# Patient Record
Sex: Female | Born: 1970 | Race: Black or African American | Hispanic: No | Smoking: Current every day smoker
Health system: Southern US, Community
[De-identification: ages and names within clinical notes are randomized; demographics above are authoritative.]

## PROBLEM LIST (undated history)

## (undated) DIAGNOSIS — R7303 Prediabetes: Secondary | ICD-10-CM

## (undated) DIAGNOSIS — R531 Weakness: Secondary | ICD-10-CM

## (undated) DIAGNOSIS — I10 Essential (primary) hypertension: Secondary | ICD-10-CM

## (undated) DIAGNOSIS — R748 Abnormal levels of other serum enzymes: Secondary | ICD-10-CM

## (undated) DIAGNOSIS — H5440 Blindness, one eye, unspecified eye: Secondary | ICD-10-CM

## (undated) DIAGNOSIS — F1010 Alcohol abuse, uncomplicated: Secondary | ICD-10-CM

## (undated) DIAGNOSIS — F419 Anxiety disorder, unspecified: Secondary | ICD-10-CM

## (undated) DIAGNOSIS — D509 Iron deficiency anemia, unspecified: Secondary | ICD-10-CM

## (undated) DIAGNOSIS — Z8619 Personal history of other infectious and parasitic diseases: Secondary | ICD-10-CM

## (undated) DIAGNOSIS — Z970 Presence of artificial eye: Secondary | ICD-10-CM

## (undated) DIAGNOSIS — J45909 Unspecified asthma, uncomplicated: Secondary | ICD-10-CM

## (undated) DIAGNOSIS — G4700 Insomnia, unspecified: Secondary | ICD-10-CM

## (undated) DIAGNOSIS — G40909 Epilepsy, unspecified, not intractable, without status epilepticus: Secondary | ICD-10-CM

## (undated) HISTORY — DX: Insomnia, unspecified: G47.00

## (undated) HISTORY — DX: Blindness, one eye, unspecified eye: H54.40

## (undated) HISTORY — DX: Anxiety disorder, unspecified: F41.9

## (undated) HISTORY — PX: APPENDECTOMY: SHX54

## (undated) HISTORY — DX: Prediabetes: R73.03

## (undated) HISTORY — DX: Epilepsy, unspecified, not intractable, without status epilepticus: G40.909

## (undated) HISTORY — DX: Abnormal levels of other serum enzymes: R74.8

## (undated) HISTORY — DX: Alcohol abuse, uncomplicated: F10.10

## (undated) HISTORY — DX: Iron deficiency anemia, unspecified: D50.9

## (undated) HISTORY — DX: Presence of artificial eye: Z97.0

## (undated) HISTORY — DX: Weakness: R53.1

## (undated) HISTORY — DX: Personal history of other infectious and parasitic diseases: Z86.19

## (undated) HISTORY — PX: ANKLE FRACTURE SURGERY: SHX122

## (undated) HISTORY — DX: Unspecified asthma, uncomplicated: J45.909

## (undated) HISTORY — PX: TONSILLECTOMY: SUR1361

---

## 1997-07-27 DIAGNOSIS — R531 Weakness: Secondary | ICD-10-CM

## 1997-07-27 DIAGNOSIS — H5440 Blindness, one eye, unspecified eye: Secondary | ICD-10-CM

## 1997-07-27 HISTORY — PX: OTHER SURGICAL HISTORY: SHX169

## 1997-07-27 HISTORY — DX: Weakness: R53.1

## 1997-07-27 HISTORY — DX: Blindness, one eye, unspecified eye: H54.40

## 2004-11-27 ENCOUNTER — Emergency Department: Payer: Self-pay | Admitting: Internal Medicine

## 2005-02-03 ENCOUNTER — Emergency Department: Payer: Self-pay | Admitting: Emergency Medicine

## 2005-10-28 ENCOUNTER — Ambulatory Visit: Payer: Self-pay | Admitting: Podiatry

## 2006-02-25 ENCOUNTER — Inpatient Hospital Stay: Payer: Self-pay | Admitting: General Practice

## 2006-06-02 ENCOUNTER — Encounter: Payer: Self-pay | Admitting: Physician Assistant

## 2006-08-19 ENCOUNTER — Emergency Department: Payer: Self-pay | Admitting: General Practice

## 2007-06-01 ENCOUNTER — Other Ambulatory Visit: Payer: Self-pay

## 2007-06-01 ENCOUNTER — Emergency Department: Payer: Self-pay | Admitting: Internal Medicine

## 2007-10-05 ENCOUNTER — Ambulatory Visit: Payer: Self-pay | Admitting: Family Medicine

## 2009-01-26 ENCOUNTER — Emergency Department: Payer: Self-pay | Admitting: Emergency Medicine

## 2009-02-19 ENCOUNTER — Ambulatory Visit: Payer: Self-pay | Admitting: Family Medicine

## 2009-04-08 ENCOUNTER — Emergency Department: Payer: Self-pay | Admitting: Emergency Medicine

## 2009-08-10 ENCOUNTER — Emergency Department: Payer: Self-pay | Admitting: Emergency Medicine

## 2009-10-05 ENCOUNTER — Emergency Department: Payer: Self-pay | Admitting: Unknown Physician Specialty

## 2009-12-11 ENCOUNTER — Ambulatory Visit: Payer: Self-pay | Admitting: Internal Medicine

## 2010-01-31 ENCOUNTER — Emergency Department: Payer: Self-pay

## 2010-10-30 ENCOUNTER — Emergency Department: Payer: Self-pay | Admitting: Emergency Medicine

## 2011-11-08 ENCOUNTER — Emergency Department: Payer: Self-pay | Admitting: Emergency Medicine

## 2011-11-08 LAB — BASIC METABOLIC PANEL
Anion Gap: 13 (ref 7–16)
BUN: 7 mg/dL (ref 7–18)
Calcium, Total: 8.4 mg/dL — ABNORMAL LOW (ref 8.5–10.1)
Chloride: 106 mmol/L (ref 98–107)
Creatinine: 0.71 mg/dL (ref 0.60–1.30)
EGFR (Non-African Amer.): >60
Glucose: 92 mg/dL (ref 65–99)

## 2011-11-08 LAB — TROPONIN I: Troponin-I: <0.02 ng/mL

## 2011-11-08 LAB — LIPASE, BLOOD: Lipase: 112 U/L (ref 73–393)

## 2011-11-08 LAB — HEPATIC FUNCTION PANEL A (ARMC)
Albumin: 4 g/dL (ref 3.4–5.0)
Alkaline Phosphatase: 85 U/L (ref 50–136)
Bilirubin, Direct: <0.1 mg/dL (ref 0.00–0.20)
SGOT(AST): 58 U/L — ABNORMAL HIGH (ref 15–37)
Total Protein: 9 g/dL — ABNORMAL HIGH (ref 6.4–8.2)

## 2011-11-08 LAB — CBC
HGB: 14.1 g/dL (ref 12.0–16.0)
MCH: 24.1 pg — ABNORMAL LOW (ref 26.0–34.0)
MCHC: 31.6 g/dL — ABNORMAL LOW (ref 32.0–36.0)
MCV: 76 fL — ABNORMAL LOW (ref 80–100)
RDW: 15.8 % — ABNORMAL HIGH (ref 11.5–14.5)
WBC: 10.1 10*3/uL (ref 3.6–11.0)

## 2012-04-11 ENCOUNTER — Encounter: Payer: Self-pay | Admitting: Internal Medicine

## 2012-04-26 ENCOUNTER — Encounter: Payer: Self-pay | Admitting: Internal Medicine

## 2012-05-27 ENCOUNTER — Encounter: Payer: Self-pay | Admitting: Internal Medicine

## 2012-05-30 ENCOUNTER — Ambulatory Visit: Payer: Self-pay | Admitting: Gastroenterology

## 2012-06-08 ENCOUNTER — Ambulatory Visit: Payer: Self-pay | Admitting: Gastroenterology

## 2012-06-09 LAB — PATHOLOGY REPORT

## 2012-07-27 HISTORY — PX: CHOLECYSTECTOMY: SHX55

## 2012-08-03 ENCOUNTER — Ambulatory Visit: Payer: Self-pay | Admitting: Emergency Medicine

## 2012-08-09 ENCOUNTER — Ambulatory Visit: Payer: Self-pay | Admitting: Emergency Medicine

## 2012-08-09 LAB — HEPATIC FUNCTION PANEL A (ARMC)
Albumin: 3.9 g/dL (ref 3.4–5.0)
Alkaline Phosphatase: 93 U/L (ref 50–136)
Bilirubin,Total: 0.4 mg/dL (ref 0.2–1.0)

## 2012-08-10 LAB — PATHOLOGY REPORT

## 2013-03-21 ENCOUNTER — Inpatient Hospital Stay: Payer: Self-pay | Admitting: Internal Medicine

## 2013-03-21 LAB — COMPREHENSIVE METABOLIC PANEL
Alkaline Phosphatase: 130 U/L (ref 50–136)
Anion Gap: 17 — ABNORMAL HIGH (ref 7–16)
BUN: 14 mg/dL (ref 7–18)
Bilirubin,Total: 4 mg/dL — ABNORMAL HIGH (ref 0.2–1.0)
Calcium, Total: 9 mg/dL (ref 8.5–10.1)
Chloride: 98 mmol/L (ref 98–107)
Co2: 13 mmol/L — ABNORMAL LOW (ref 21–32)
Creatinine: 1.8 mg/dL — ABNORMAL HIGH (ref 0.60–1.30)
EGFR (African American): 40 — ABNORMAL LOW
EGFR (Non-African Amer.): 35 — ABNORMAL LOW
Glucose: 90 mg/dL (ref 65–99)
Osmolality: 257 (ref 275–301)
Potassium: 6.4 mmol/L — ABNORMAL HIGH (ref 3.5–5.1)
SGOT(AST): >2000 U/L — ABNORMAL HIGH (ref 15–37)
Sodium: 128 mmol/L — ABNORMAL LOW (ref 136–145)

## 2013-03-21 LAB — HEPATIC FUNCTION PANEL A (ARMC)
Alkaline Phosphatase: 101 U/L (ref 50–136)
Bilirubin,Total: 3 mg/dL — ABNORMAL HIGH (ref 0.2–1.0)
SGOT(AST): >2000 U/L — ABNORMAL HIGH (ref 15–37)
SGPT (ALT): >4000 U/L — ABNORMAL HIGH (ref 12–78)
Total Protein: 7.1 g/dL (ref 6.4–8.2)

## 2013-03-21 LAB — ETHANOL: Ethanol %: <0.003 % (ref 0.000–0.080)

## 2013-03-21 LAB — CBC
HGB: 16.2 g/dL — ABNORMAL HIGH (ref 12.0–16.0)
MCH: 24.6 pg — ABNORMAL LOW (ref 26.0–34.0)
MCHC: 32.2 g/dL (ref 32.0–36.0)
MCV: 77 fL — ABNORMAL LOW (ref 80–100)
Platelet: 242 10*3/uL (ref 150–440)
RBC: 6.57 10*6/uL — ABNORMAL HIGH (ref 3.80–5.20)
RDW: 14.7 % — ABNORMAL HIGH (ref 11.5–14.5)
WBC: 11.5 10*3/uL — ABNORMAL HIGH (ref 3.6–11.0)

## 2013-03-21 LAB — PROTIME-INR: Prothrombin Time: 25.6 secs — ABNORMAL HIGH (ref 11.5–14.7)

## 2013-03-21 LAB — AMMONIA: Ammonia, Plasma: 54 mcmol/L — ABNORMAL HIGH (ref 11–32)

## 2013-03-21 LAB — ACETAMINOPHEN LEVEL
Acetaminophen: 60 ug/mL — ABNORMAL HIGH
Acetaminophen: 50 ug/mL — ABNORMAL HIGH

## 2013-03-22 LAB — COMPREHENSIVE METABOLIC PANEL
Albumin: 2.7 g/dL — ABNORMAL LOW (ref 3.4–5.0)
Alkaline Phosphatase: 86 U/L (ref 50–136)
Bilirubin,Total: 2.5 mg/dL — ABNORMAL HIGH (ref 0.2–1.0)
Calcium, Total: 8 mg/dL — ABNORMAL LOW (ref 8.5–10.1)
Co2: 22 mmol/L (ref 21–32)
Creatinine: 3.02 mg/dL — ABNORMAL HIGH (ref 0.60–1.30)
EGFR (African American): 21 — ABNORMAL LOW
Osmolality: 284 (ref 275–301)
SGOT(AST): >2000 U/L — ABNORMAL HIGH (ref 15–37)

## 2013-03-22 LAB — CBC WITH DIFFERENTIAL/PLATELET
Basophil #: 0 x10 3/mm 3
Basophil %: 0.3 %
Eosinophil #: 0 x10 3/mm 3
Eosinophil %: 0 %
HCT: 38.3 %
HGB: 12.5 g/dL
Lymphocyte %: 6.4 %
Lymphs Abs: 0.7 x10 3/mm 3 — ABNORMAL LOW
MCH: 24.4 pg — ABNORMAL LOW
MCHC: 32.6 g/dL
MCV: 75 fL — ABNORMAL LOW
Monocyte #: 0.4 "x10 3/mm "
Monocyte %: 4.1 %
Neutrophil #: 9.6 x10 3/mm 3 — ABNORMAL HIGH
Neutrophil %: 89.2 %
Platelet: 253 x10 3/mm 3
RBC: 5.11 X10 6/mm 3
RDW: 14.5 %
WBC: 10.7 x10 3/mm 3

## 2013-03-22 LAB — URINALYSIS, COMPLETE
Bacteria: NONE SEEN
Bilirubin,UR: NEGATIVE
Glucose,UR: >=500 mg/dL
Leukocyte Esterase: NEGATIVE
Nitrite: NEGATIVE
Ph: 6
Protein: 100
RBC,UR: 5 /HPF
Specific Gravity: 1.013
Squamous Epithelial: <1
WBC UR: 2 /HPF

## 2013-03-22 LAB — HEPATIC FUNCTION PANEL A (ARMC)
Albumin: 2.2 g/dL — ABNORMAL LOW (ref 3.4–5.0)
Bilirubin, Direct: 1 mg/dL — ABNORMAL HIGH (ref 0.00–0.20)
Bilirubin,Total: 2.3 mg/dL — ABNORMAL HIGH (ref 0.2–1.0)
Bilirubin,Total: 2.3 mg/dL — ABNORMAL HIGH (ref 0.2–1.0)
SGOT(AST): >2000 U/L — ABNORMAL HIGH (ref 15–37)
SGPT (ALT): >4000 U/L — ABNORMAL HIGH (ref 12–78)

## 2013-03-22 LAB — ACETAMINOPHEN LEVEL: Acetaminophen: 19 ug/mL

## 2013-03-22 LAB — PROTEIN / CREATININE RATIO, URINE
Protein, Random Urine: 81 mg/dL — ABNORMAL HIGH (ref 0–12)
Protein/Creat. Ratio: 1446 mg/gCREAT — ABNORMAL HIGH (ref 0–200)

## 2013-03-22 LAB — HCG, QUANTITATIVE, PREGNANCY: Beta Hcg, Quant.: <1 m[IU]/mL — ABNORMAL LOW

## 2013-03-22 LAB — PROTIME-INR: INR: 3.1

## 2013-03-23 LAB — COMPREHENSIVE METABOLIC PANEL
Albumin: 2.2 g/dL — ABNORMAL LOW (ref 3.4–5.0)
Alkaline Phosphatase: 78 U/L (ref 50–136)
Bilirubin,Total: 2.4 mg/dL — ABNORMAL HIGH (ref 0.2–1.0)
Calcium, Total: 8.1 mg/dL — ABNORMAL LOW (ref 8.5–10.1)
Co2: 18 mmol/L — ABNORMAL LOW (ref 21–32)
Creatinine: 3.47 mg/dL — ABNORMAL HIGH (ref 0.60–1.30)
EGFR (Non-African Amer.): 16 — ABNORMAL LOW
Glucose: 112 mg/dL — ABNORMAL HIGH (ref 65–99)
Potassium: 2.8 mmol/L — ABNORMAL LOW (ref 3.5–5.1)
SGOT(AST): >2000 U/L — ABNORMAL HIGH (ref 15–37)
SGPT (ALT): 2294 U/L — ABNORMAL HIGH (ref 12–78)
Sodium: 139 mmol/L (ref 136–145)

## 2013-03-23 LAB — MAGNESIUM: Magnesium: 1.9 mg/dL

## 2013-03-23 LAB — HEPATIC FUNCTION PANEL A (ARMC)
Alkaline Phosphatase: 86 U/L (ref 50–136)
Bilirubin, Direct: 1.6 mg/dL — ABNORMAL HIGH (ref 0.00–0.20)
Bilirubin,Total: 2.8 mg/dL — ABNORMAL HIGH (ref 0.2–1.0)
Total Protein: 5.3 g/dL — ABNORMAL LOW (ref 6.4–8.2)

## 2013-03-23 LAB — CBC WITH DIFFERENTIAL/PLATELET
Basophil #: 0 10*3/uL (ref 0.0–0.1)
Basophil %: 0.2 %
Eosinophil #: 0 10*3/uL (ref 0.0–0.7)
Eosinophil %: 0.3 %
HCT: 32.2 % — ABNORMAL LOW (ref 35.0–47.0)
MCH: 24.8 pg — ABNORMAL LOW (ref 26.0–34.0)
MCV: 74 fL — ABNORMAL LOW (ref 80–100)
Monocyte %: 5.8 %
Platelet: 192 10*3/uL (ref 150–440)
RBC: 4.39 10*6/uL (ref 3.80–5.20)
RDW: 14.8 % — ABNORMAL HIGH (ref 11.5–14.5)
WBC: 10.2 10*3/uL (ref 3.6–11.0)

## 2013-03-23 LAB — AMMONIA: Ammonia, Plasma: 52 mcmol/L — ABNORMAL HIGH (ref 11–32)

## 2013-03-23 LAB — ACETAMINOPHEN LEVEL: Acetaminophen: 3 ug/mL — ABNORMAL LOW

## 2013-03-23 LAB — PROTIME-INR: INR: 1.8

## 2013-03-24 LAB — COMPREHENSIVE METABOLIC PANEL
Albumin: 2.5 g/dL — ABNORMAL LOW (ref 3.4–5.0)
Alkaline Phosphatase: 88 U/L (ref 50–136)
Calcium, Total: 8.7 mg/dL (ref 8.5–10.1)
Creatinine: 3.55 mg/dL — ABNORMAL HIGH (ref 0.60–1.30)
EGFR (African American): 18 — ABNORMAL LOW
EGFR (Non-African Amer.): 15 — ABNORMAL LOW
Glucose: 103 mg/dL — ABNORMAL HIGH (ref 65–99)
Osmolality: 287 (ref 275–301)
SGOT(AST): 407 U/L — ABNORMAL HIGH (ref 15–37)
Total Protein: 5.8 g/dL — ABNORMAL LOW (ref 6.4–8.2)

## 2013-03-24 LAB — CBC WITH DIFFERENTIAL/PLATELET
Basophil #: 0.1 10*3/uL (ref 0.0–0.1)
Eosinophil #: 0.1 10*3/uL (ref 0.0–0.7)
Eosinophil %: 1.4 %
HCT: 30.7 % — ABNORMAL LOW (ref 35.0–47.0)
HGB: 10.3 g/dL — ABNORMAL LOW (ref 12.0–16.0)
Lymphocyte #: 2.7 10*3/uL (ref 1.0–3.6)
Lymphocyte %: 25.9 %
MCH: 24.7 pg — ABNORMAL LOW (ref 26.0–34.0)
Monocyte %: 8.6 %
RDW: 15.1 % — ABNORMAL HIGH (ref 11.5–14.5)
WBC: 10.3 10*3/uL (ref 3.6–11.0)

## 2013-03-24 LAB — PROTIME-INR: Prothrombin Time: 16 secs — ABNORMAL HIGH (ref 11.5–14.7)

## 2013-03-24 LAB — CREATININE, SERUM: Creatinine: 3.54 mg/dL — ABNORMAL HIGH (ref 0.60–1.30)

## 2013-03-25 LAB — COMPREHENSIVE METABOLIC PANEL
Anion Gap: 11 (ref 7–16)
BUN: 24 mg/dL — ABNORMAL HIGH (ref 7–18)
Calcium, Total: 8.7 mg/dL (ref 8.5–10.1)
Chloride: 111 mmol/L — ABNORMAL HIGH (ref 98–107)
Co2: 19 mmol/L — ABNORMAL LOW (ref 21–32)
Creatinine: 3.09 mg/dL — ABNORMAL HIGH (ref 0.60–1.30)
EGFR (African American): 21 — ABNORMAL LOW
Glucose: 91 mg/dL (ref 65–99)
Potassium: 3.5 mmol/L (ref 3.5–5.1)
Sodium: 141 mmol/L (ref 136–145)
Total Protein: 6.2 g/dL — ABNORMAL LOW (ref 6.4–8.2)

## 2013-04-20 ENCOUNTER — Ambulatory Visit: Payer: Self-pay | Admitting: Specialist

## 2013-07-11 ENCOUNTER — Encounter: Payer: Self-pay | Admitting: Internal Medicine

## 2013-07-27 ENCOUNTER — Encounter: Payer: Self-pay | Admitting: Internal Medicine

## 2013-08-15 ENCOUNTER — Emergency Department: Payer: Self-pay | Admitting: Emergency Medicine

## 2013-10-18 DIAGNOSIS — B1920 Unspecified viral hepatitis C without hepatic coma: Secondary | ICD-10-CM | POA: Insufficient documentation

## 2014-11-16 NOTE — Consult Note (Signed)
Chief Complaint:  Subjective/Chief Complaint Covering for Dr. Gustavo Lah. Feels well. LFT continues to drop. Cr also improving. Eating well. No GI sxs.   VITAL SIGNS/ANCILLARY NOTES: **Vital Signs.:   30-Aug-14 04:15  Vital Signs Type Routine  Temperature Temperature (F) 98.5  Celsius 36.9  Temperature Source oral  Pulse Pulse 97  Respirations Respirations 20  Systolic BP Systolic BP 459  Diastolic BP (mmHg) Diastolic BP (mmHg) 92  Mean BP 105  Pulse Ox % Pulse Ox % 100  Pulse Ox Activity Level  At rest  Oxygen Delivery Room Air/ 21 %   Brief Assessment:  GEN no acute distress   Cardiac Regular   Respiratory clear BS   Gastrointestinal Normal   Lab Results: Hepatic:  30-Aug-14 04:11   Bilirubin, Total  2.5  Alkaline Phosphatase 102  SGPT (ALT)  857  SGOT (AST)  151  Total Protein, Serum  6.2  Albumin, Serum  2.6  Routine Chem:  30-Aug-14 04:11   Glucose, Serum 91  BUN  24  Creatinine (comp)  3.09  Sodium, Serum 141  Potassium, Serum 3.5  Chloride, Serum  111  CO2, Serum  19  Calcium (Total), Serum 8.7  Osmolality (calc) 285  eGFR (African American)  21  eGFR (Non-African American)  18 (eGFR values <33m/min/1.73 m2 may be an indication of chronic kidney disease (CKD). Calculated eGFR is useful in patients with stable renal function. The eGFR calculation will not be reliable in acutely ill patients when serum creatinine is changing rapidly. It is not useful in  patients on dialysis. The eGFR calculation may not be applicable to patients at the low and high extremes of body sizes, pregnant women, and vegetarians.)  Result Comment LABS - This specimen was collected through an   - indwelling catheter or arterial line.  - A minimum of 517m of blood was wasted prior    - to collecting the sample.  Interpret  - results with caution.  Result(s) reported on 25 Mar 2013 at 05:51AM.  Anion Gap 11   Assessment/Plan:  Assessment/Plan:  Assessment Tylenol  toxicity. LFT improving.   Plan Ok for discharge from GI point of view. Will sign off. Thanks.   Electronic Signatures: OhVerdie ShireMD)  (Signed 30-Aug-14 10:10)  Authored: Chief Complaint, VITAL SIGNS/ANCILLARY NOTES, Brief Assessment, Lab Results, Assessment/Plan   Last Updated: 30-Aug-14 10:10 by OhVerdie ShireMD)
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
## 2014-11-16 NOTE — Consult Note (Signed)
Chief Complaint:  Subjective/Chief Complaint seen for acetaminophen toxicity-continues to improve , denies n/v or abdominal pain.   VITAL SIGNS/ANCILLARY NOTES: **Vital Signs.:   29-Aug-14 13:18  Vital Signs Type Routine  Temperature Temperature (F) 98.7  Celsius 37  Temperature Source oral  Pulse Pulse 104  Respirations Respirations 20  Systolic BP Systolic BP 629  Diastolic BP (mmHg) Diastolic BP (mmHg) 91  Mean BP 111  Pulse Ox % Pulse Ox % 99  Pulse Ox Activity Level  At rest  Oxygen Delivery Room Air/ 21 %   Brief Assessment:  Cardiac Regular   Respiratory clear BS   Gastrointestinal details normal Soft  Nontender  Nondistended  No masses palpable  Bowel sounds normal   Lab Results: Hepatic:  26-Aug-14 09:46   Bilirubin, Total -  SGPT (ALT) -  SGOT (AST) -    12:30   Bilirubin, Total  4.0  SGPT (ALT)  > 4000  SGOT (AST)  > 2000    20:22   Bilirubin, Total  3.0  SGPT (ALT)  > 4000  SGOT (AST)  > 2000  27-Aug-14 03:37   Bilirubin, Total  2.5  SGPT (ALT)  > 4000  SGOT (AST)  > 2000    11:05   Bilirubin, Total  2.3  SGPT (ALT)  > 4000  SGOT (AST)  > 2000    18:50   Bilirubin, Total  2.3  SGPT (ALT)  3343  SGOT (AST)  > 2000  28-Aug-14 05:17   Bilirubin, Total  2.4  SGPT (ALT)  2294  SGOT (AST)  > 2000    12:12   Bilirubin, Total  2.8  SGPT (ALT)  1941  SGOT (AST)  1729  29-Aug-14 05:45   Bilirubin, Total -  Bilirubin, Total  3.5  Alkaline Phosphatase -  Alkaline Phosphatase 88  SGPT (ALT) -  SGPT (ALT)  1337  SGOT (AST) -  SGOT (AST)  407  Total Protein, Serum -  Total Protein, Serum  5.8  Albumin, Serum -  Albumin, Serum  2.5  General Ref:  28-Aug-14 12:12   Acetaminophen, Serum  3 (10-30 POTENTIALLY TOXIC:  > 200 mcg/mL  > 50 mcg/mL at 12 hr after  ingestion  > 300 mcg/mL at 4 hr after  ingestion)  Routine Chem:  29-Aug-14 05:45   Creatinine (comp) -  Creatinine (comp)  3.55  eGFR (African American) -  eGFR (African American)   18  eGFR (Non-African American) - (eGFR values <64m/min/1.73 m2 may be an indication of chronic kidney disease (CKD). Calculated eGFR is useful in patients with stable renal function. The eGFR calculation will not be reliable in acutely ill patients when serum creatinine is changing rapidly. It is not useful in  patients on dialysis. The eGFR calculation may not be applicable to patients at the low and high extremes of body sizes, pregnant women, and vegetarians.)  eGFR (Non-African American)  15 (eGFR values <670mmin/1.73 m2 may be an indication of chronic kidney disease (CKD). Calculated eGFR is useful in patients with stable renal function. The eGFR calculation will not be reliable in acutely ill patients when serum creatinine is changing rapidly. It is not useful in  patients on dialysis. The eGFR calculation may not be applicable to patients at the low and high extremes of body sizes, pregnant women, and vegetarians.)  Glucose, Serum -  Glucose, Serum  103  BUN -  BUN  27  Sodium, Serum -  Sodium, Serum 141  Potassium, Serum -  Potassium, Serum 3.5  Chloride, Serum -  Chloride, Serum  112  CO2, Serum -  CO2, Serum  17  Calcium (Total), Serum -  Calcium (Total), Serum 8.7  Osmolality (calc) -  Osmolality (calc) 287  Result Comment MET C - TEST ORDERED IN DUPLICATE...TPL  Result(s) reported on 24 Mar 2013 at 05:52AM.  Anion Gap -  Anion Gap 12    13:36   Creatinine (comp)  3.54  eGFR (African American)  18  eGFR (Non-African American)  15 (eGFR values <35m/min/1.73 m2 may be an indication of chronic kidney disease (CKD). Calculated eGFR is useful in patients with stable renal function. The eGFR calculation will not be reliable in acutely ill patients when serum creatinine is changing rapidly. It is not useful in  patients on dialysis. The eGFR calculation may not be applicable to patients at the low and high extremes of body sizes, pregnant women, and  vegetarians.)  Routine Coag:  26-Aug-14 12:30   INR 2.4 (INR reference interval applies to patients on anticoagulant therapy. A single INR therapeutic range for coumarins is not optimal for all indications; however, the suggested range for most indications is 2.0 - 3.0. Exceptions to the INR Reference Range may include: Prosthetic heart valves, acute myocardial infarction, prevention of myocardial infarction, and combinations of aspirin and anticoagulant. The need for a higher or lower target INR must be assessed individually. Reference: The Pharmacology and Management of the Vitamin K  antagonists: the seventh ACCP Conference on Antithrombotic and Thrombolytic Therapy. CEXBMW.4132Sept:126 (3suppl): 2N9146842 A HCT value >55% may artifactually increase the PT.  In one study,  the increase was an average of 25%. Reference:  "Effect on Routine and Special Coagulation Testing Values of Citrate Anticoagulant Adjustment in Patients with High HCT Values." American Journal of Clinical Pathology 2006;126:400-405.)  27-Aug-14 11:05   INR 3.1 (INR reference interval applies to patients on anticoagulant therapy. A single INR therapeutic range for coumarins is not optimal for all indications; however, the suggested range for most indications is 2.0 - 3.0. Exceptions to the INR Reference Range may include: Prosthetic heart valves, acute myocardial infarction, prevention of myocardial infarction, and combinations of aspirin and anticoagulant. The need for a higher or lower target INR must be assessed individually. Reference: The Pharmacology and Management of the Vitamin K  antagonists: the seventh ACCP Conference on Antithrombotic and Thrombolytic Therapy. CGMWNU.2725Sept:126 (3suppl): [id number] A HCT value >55% may artifactually increase the PT.  In one study,  the increase was an average of 25%. Reference:  "Effect on Routine and Special Coagulation Testing Values of Citrate Anticoagulant  Adjustment in Patients with High HCT Values." American Journal of Clinical Pathology 2006;126:400-405.)  28-Aug-14 10:00   INR 1.8 (INR reference interval applies to patients on anticoagulant therapy. A single INR therapeutic range for coumarins is not optimal for all indications; however, the suggested range for most indications is 2.0 - 3.0. Exceptions to the INR Reference Range may include: Prosthetic heart valves, acute myocardial infarction, prevention of myocardial infarction, and combinations of aspirin and anticoagulant. The need for a higher or lower target INR must be assessed individually. Reference: The Pharmacology and Management of the Vitamin K  antagonists: the seventh ACCP Conference on Antithrombotic and Thrombolytic Therapy. CDGUYQ.0347Sept:126 (3suppl): [id number] A HCT value >55% may artifactually increase the PT.  In one study,  the increase was an average of 25%. Reference:  "Effect on Routine and Special Coagulation Testing Values of Citrate Anticoagulant Adjustment in Patients with High  HCT Values." American Journal of Clinical Pathology 4496;759:163-846.)  29-Aug-14 05:45   Prothrombin  16.0  INR 1.3 (INR reference interval applies to patients on anticoagulant therapy. A single INR therapeutic range for coumarins is not optimal for all indications; however, the suggested range for most indications is 2.0 - 3.0. Exceptions to the INR Reference Range may include: Prosthetic heart valves, acute myocardial infarction, prevention of myocardial infarction, and combinations of aspirin and anticoagulant. The need for a higher or lower target INR must be assessed individually. Reference: The Pharmacology and Management of the Vitamin K  antagonists: the seventh ACCP Conference on Antithrombotic and Thrombolytic Therapy. KZLDJ.5701 Sept:126 (3suppl): [id number]. A HCT value >55% may artifactually increase the PT.  In one study,  the increase was an average of  25%. Reference:  "Effect on Routine and Special Coagulation Testing Values of Citrate Anticoagulant Adjustment in Patients with High HCT Values." American Journal of Clinical Pathology 2006;126:400-405.)  Routine Hem:  29-Aug-14 05:45   WBC (CBC) 10.3  RBC (CBC) 4.17  Hemoglobin (CBC)  10.3  Hematocrit (CBC)  30.7  Platelet Count (CBC) 231  MCV  74  MCH  24.7  MCHC 33.5  RDW  15.1  Neutrophil % 63.3  Lymphocyte % 25.9  Monocyte % 8.6  Eosinophil % 1.4  Basophil % 0.8  Neutrophil # 6.5  Lymphocyte # 2.7  Monocyte # 0.9  Eosinophil # 0.1  Basophil # 0.1 (Result(s) reported on 24 Mar 2013 at [facility].)   Assessment/Plan:  Assessment/Plan:  Assessment 1) acetaminophen toxicity-lfts improving, pt improving. Bili lagging as well as renal status.   Plan 1) nac d/c this am, continue current.  I will ask Dr Candace Cruise to see tomorrow unless patient is d/c.   Electronic Signatures: Loistine Simas (MD)  (Signed 29-Aug-14 14:36)  Authored: Chief Complaint, VITAL SIGNS/ANCILLARY NOTES, Brief Assessment, Lab Results, Assessment/Plan   Last Updated: 29-Aug-14 14:36 by Loistine Simas (MD)

## 2014-11-16 NOTE — Consult Note (Signed)
Brief Consult Note: Diagnosis: acetaminophen toxicity.   Patient was seen by consultant.   Recommend further assessment or treatment.   Discussed with Attending MD.   Comments: Patietn seen and examined, please see full GI consult.  44 yo f admitted with excessive acetaminophen use over the past 2 days, with current abdominal pain, from taking the medicine for a displaced transverse fracture upper left humerus.  Patient also has a h/o chronic hepatitis C and alcohol abuse, drinking (2) 40 Oz beers daily.  Patient with remote IVDA and cocaine use.  With h/o ongoing ETOH abuse, patient is not a candidate for liver transplant.  I have disucssed the case with [facility], who agrees with this . Recommend continuing NAC per protocol as you are.  Adequate iv hydration, serial lfts and PT, ICU admission.  Will reassess in regard to possibility of concurrent etoh related hepatitis, although this is no a major consideration at this point. Will follow with you.  Electronic Signatures for Addendum Section:  Barnetta ChapelSkulskie, Shruthi Northrup (MD) (Signed Addendum 26-Aug-14 19:27)  consult [phone]   Electronic Signatures: Barnetta ChapelSkulskie, Chanley Mcenery (MD)  (Signed 26-Aug-14 19:12)  Authored: Brief Consult Note   Last Updated: 26-Aug-14 19:27 by Barnetta ChapelSkulskie, Dwaine Pringle (MD)
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
## 2014-11-16 NOTE — Op Note (Signed)
PATIENT NAME:  Victoria Gross, Sasha M MR#:  [id number] DATE OF BIRTH:  1970/09/19  DATE OF PROCEDURE:  08/09/2012  PREOPERATIVE DIAGNOSIS: Acute cholecystitis.   POSTOPERATIVE DIAGNOSIS: Acute cholecystitis.   OPERATION: Laparoscopic cholecystectomy.   SURGEON: Meryle ReadyMasud S Isidore Margraf, MD   FINDINGS: Large multiple gallstones. The patient also has hepatitis C. Liver did not look cirrhotic.   DESCRIPTION OF PROCEDURE: The patient was put on the back. After she was put to sleep, a small incision was made above the umbilicus. After cutting skin and subcutaneous tissue, the fascia was cut. The trocar was inserted under direct vision. Another trocar was put in the epigastric region. Two 5 mm were put in the right upper quadrant of the abdomen.  The gallbladder had multiple adhesions. Those were released down and then lifted up. It was distended. The patient was found to have a very large stone stuck in the distal part of the gallbladder. The gallbladder was lifted up, and all the adhesions were released, and then the cystic artery, cystic duct were then clipped and cut. After that, the gallbladder was then released from the liver slowly and taken off completely. A piece of Surgicel was then put in the liver bed. The gallbladder was then removed from the umbilical port, and I had to extend the incision to get the stones out. The patient was found to have multiple adhesions right under the belly button. After this was done, the fascia was closed with interrupted 0 Vicryl sutures after I made sure there was no problem with the operative site. The patient had no bleeding and no biliary leakage from the liver area or trocar areas. After I closed the umbilical port with interrupted 0 Vicryl sutures, the staples were then applied and Marcaine was injected. The patient tolerated the procedure well and was sent to the recovery room in satisfactory condition.  ____________________________ Alton RevereMasud S. Cecelia ByarsHashmi, MD msh:cb D: 08/09/2012  11:44:25 ET T: 08/09/2012 12:11:31 ET JOB#: [id number] cc: Oddie Kuhlmann S. Cecelia ByarsHashmi, MD, <Dictator> [facility] Meryle ReadyMASUD S Nhu Glasby MD ELECTRONICALLY SIGNED 08/09/2012 16:27
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
## 2015-02-18 ENCOUNTER — Ambulatory Visit: Payer: Medicaid Other

## 2015-03-04 ENCOUNTER — Ambulatory Visit: Payer: Medicare Other | Attending: Physician Assistant

## 2015-03-04 DIAGNOSIS — R2681 Unsteadiness on feet: Secondary | ICD-10-CM | POA: Insufficient documentation

## 2015-03-04 DIAGNOSIS — R531 Weakness: Secondary | ICD-10-CM | POA: Diagnosis not present

## 2015-03-04 NOTE — Therapy (Signed)
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 face in 1999 causing L sided hemiparesis. pt demonstates impaired strength L > R side, L UE  contracture, impaired balance, mobility and ability to transfer requring assist.    Rehab Potential Fair   PT Frequency One time visit         Problem List There are no active problems to display for this patient.  Gorden Harms. Carrington Mullenax, PT, DPT [phone]  Josetta Wigal 03/04/2015, 11:58 AM  [place] [facility] [address] [place], [place], [address] Phone: [phone]   Fax:  [phone]

## 2015-05-23 ENCOUNTER — Other Ambulatory Visit: Payer: Self-pay | Admitting: Internal Medicine

## 2015-05-23 DIAGNOSIS — R1013 Epigastric pain: Secondary | ICD-10-CM

## 2015-06-03 ENCOUNTER — Ambulatory Visit
Admission: RE | Admit: 2015-06-03 | Discharge: 2015-06-03 | Disposition: A | Discharge status: Home / Self Care | Payer: Medicare Other | Source: Ambulatory Visit | Attending: Internal Medicine | Admitting: Internal Medicine

## 2015-06-03 DIAGNOSIS — R1013 Epigastric pain: Secondary | ICD-10-CM | POA: Diagnosis present

## 2015-07-10 ENCOUNTER — Ambulatory Visit: Payer: Medicare Other | Admitting: Physical Therapy

## 2015-08-27 ENCOUNTER — Ambulatory Visit: Payer: Medicare Other

## 2015-09-03 ENCOUNTER — Ambulatory Visit: Payer: Medicare Other | Attending: Family Medicine | Admitting: Physical Therapy

## 2015-12-25 ENCOUNTER — Encounter: Payer: Self-pay | Admitting: Physical Therapy

## 2015-12-25 ENCOUNTER — Ambulatory Visit: Payer: Medicare Other | Attending: Family Medicine | Admitting: Physical Therapy

## 2015-12-25 DIAGNOSIS — M6281 Muscle weakness (generalized): Secondary | ICD-10-CM | POA: Insufficient documentation

## 2015-12-25 DIAGNOSIS — R2681 Unsteadiness on feet: Secondary | ICD-10-CM | POA: Insufficient documentation
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
## 2016-02-25 ENCOUNTER — Other Ambulatory Visit: Payer: Self-pay | Admitting: Internal Medicine

## 2016-02-25 DIAGNOSIS — M79602 Pain in left arm: Secondary | ICD-10-CM

## 2016-02-26 ENCOUNTER — Ambulatory Visit: Payer: Medicare Other

## 2016-02-28 ENCOUNTER — Ambulatory Visit: Payer: Medicare Other

## 2016-03-03 ENCOUNTER — Other Ambulatory Visit: Payer: Self-pay | Admitting: Internal Medicine

## 2016-03-03 ENCOUNTER — Telehealth: Payer: Self-pay | Admitting: Gastroenterology

## 2016-03-03 DIAGNOSIS — Z1231 Encounter for screening mammogram for malignant neoplasm of breast: Secondary | ICD-10-CM

## 2016-03-03 NOTE — Telephone Encounter (Signed)
Left a voice message for patient to call and schedule appointment for Hep C. Referred by Phineas Realharles Drew

## 2016-03-06 ENCOUNTER — Ambulatory Visit
Admission: RE | Admit: 2016-03-06 | Discharge: 2016-03-06 | Disposition: A | Discharge status: Home / Self Care | Payer: Medicare Other | Source: Ambulatory Visit | Attending: Internal Medicine | Admitting: Internal Medicine

## 2016-03-06 DIAGNOSIS — M79602 Pain in left arm: Secondary | ICD-10-CM | POA: Diagnosis not present

## 2016-03-19 ENCOUNTER — Ambulatory Visit: Payer: Medicare Other | Attending: Internal Medicine

## 2016-04-10 ENCOUNTER — Ambulatory Visit
Admission: RE | Admit: 2016-04-10 | Discharge: 2016-04-10 | Disposition: A | Discharge status: Home / Self Care | Payer: Medicare Other | Source: Ambulatory Visit | Attending: Internal Medicine | Admitting: Internal Medicine

## 2016-04-10 ENCOUNTER — Encounter: Payer: Self-pay | Admitting: Radiology

## 2016-04-10 DIAGNOSIS — Z1231 Encounter for screening mammogram for malignant neoplasm of breast: Secondary | ICD-10-CM | POA: Diagnosis present

## 2016-04-10 DIAGNOSIS — R928 Other abnormal and inconclusive findings on diagnostic imaging of breast: Secondary | ICD-10-CM | POA: Insufficient documentation

## 2016-04-13 ENCOUNTER — Other Ambulatory Visit: Payer: Self-pay

## 2016-04-13 ENCOUNTER — Ambulatory Visit: Payer: Self-pay | Admitting: Gastroenterology

## 2016-04-13 DIAGNOSIS — G40909 Epilepsy, unspecified, not intractable, without status epilepticus: Secondary | ICD-10-CM

## 2016-04-13 DIAGNOSIS — Z970 Presence of artificial eye: Secondary | ICD-10-CM | POA: Insufficient documentation

## 2016-04-13 DIAGNOSIS — H5440 Blindness, one eye, unspecified eye: Secondary | ICD-10-CM

## 2016-04-13 DIAGNOSIS — F419 Anxiety disorder, unspecified: Secondary | ICD-10-CM

## 2016-04-13 DIAGNOSIS — R531 Weakness: Secondary | ICD-10-CM

## 2016-04-13 DIAGNOSIS — R748 Abnormal levels of other serum enzymes: Secondary | ICD-10-CM

## 2016-04-13 DIAGNOSIS — F1010 Alcohol abuse, uncomplicated: Secondary | ICD-10-CM

## 2016-04-15 ENCOUNTER — Other Ambulatory Visit: Payer: Self-pay | Admitting: Internal Medicine

## 2016-04-15 DIAGNOSIS — N6489 Other specified disorders of breast: Secondary | ICD-10-CM

## 2016-04-21 ENCOUNTER — Ambulatory Visit
Admission: RE | Admit: 2016-04-21 | Discharge: 2016-04-21 | Disposition: A | Discharge status: Home / Self Care | Payer: Medicare Other | Source: Ambulatory Visit | Attending: Internal Medicine | Admitting: Internal Medicine

## 2016-04-21 DIAGNOSIS — N6489 Other specified disorders of breast: Secondary | ICD-10-CM | POA: Diagnosis present

## 2016-04-21 DIAGNOSIS — N6002 Solitary cyst of left breast: Secondary | ICD-10-CM | POA: Diagnosis not present

## 2016-05-26 DIAGNOSIS — R519 Headache, unspecified: Secondary | ICD-10-CM | POA: Insufficient documentation

## 2016-06-04 ENCOUNTER — Emergency Department: Payer: Medicare Other

## 2016-06-04 ENCOUNTER — Emergency Department
Admission: EM | Admit: 2016-06-04 | Discharge: 2016-06-04 | Disposition: A | Discharge status: Home / Self Care | Payer: Medicare Other | Attending: Emergency Medicine | Admitting: Emergency Medicine

## 2016-06-04 DIAGNOSIS — Z79899 Other long term (current) drug therapy: Secondary | ICD-10-CM | POA: Diagnosis not present

## 2016-06-04 DIAGNOSIS — R1084 Generalized abdominal pain: Secondary | ICD-10-CM

## 2016-06-04 DIAGNOSIS — I10 Essential (primary) hypertension: Secondary | ICD-10-CM | POA: Insufficient documentation

## 2016-06-04 DIAGNOSIS — M62830 Muscle spasm of back: Secondary | ICD-10-CM | POA: Diagnosis not present

## 2016-06-04 DIAGNOSIS — M545 Low back pain, unspecified: Secondary | ICD-10-CM

## 2016-06-04 DIAGNOSIS — Z791 Long term (current) use of non-steroidal anti-inflammatories (NSAID): Secondary | ICD-10-CM | POA: Insufficient documentation

## 2016-06-04 DIAGNOSIS — F17200 Nicotine dependence, unspecified, uncomplicated: Secondary | ICD-10-CM | POA: Diagnosis not present

## 2016-06-04 DIAGNOSIS — R109 Unspecified abdominal pain: Secondary | ICD-10-CM | POA: Diagnosis present

## 2016-06-04 HISTORY — DX: Essential (primary) hypertension: I10

## 2016-06-04 LAB — COMPREHENSIVE METABOLIC PANEL
ALBUMIN: 3.9 g/dL (ref 3.5–5.0)
ALK PHOS: 65 U/L (ref 38–126)
ALT: 38 U/L (ref 14–54)
ANION GAP: 9 (ref 5–15)
AST: 34 U/L (ref 15–41)
BILIRUBIN TOTAL: 0.7 mg/dL (ref 0.3–1.2)
BUN: 9 mg/dL (ref 6–20)
CALCIUM: 9.1 mg/dL (ref 8.9–10.3)
CO2: 23 mmol/L (ref 22–32)
Chloride: 102 mmol/L (ref 101–111)
Creatinine, Ser: 0.68 mg/dL (ref 0.44–1.00)
GLUCOSE: 117 mg/dL — AB (ref 65–99)
POTASSIUM: 3.3 mmol/L — AB (ref 3.5–5.1)
Sodium: 134 mmol/L — ABNORMAL LOW (ref 135–145)
TOTAL PROTEIN: 8.6 g/dL — AB (ref 6.5–8.1)

## 2016-06-04 LAB — URINALYSIS COMPLETE WITH MICROSCOPIC (ARMC ONLY)
BACTERIA UA: NONE SEEN
BILIRUBIN URINE: NEGATIVE
GLUCOSE, UA: NEGATIVE mg/dL
Ketones, ur: NEGATIVE mg/dL
LEUKOCYTES UA: NEGATIVE
NITRITE: NEGATIVE
PH: 6 (ref 5.0–8.0)
Protein, ur: NEGATIVE mg/dL
SPECIFIC GRAVITY, URINE: 1.012 (ref 1.005–1.030)

## 2016-06-04 LAB — CBC WITH DIFFERENTIAL/PLATELET
BASOS ABS: 0 10*3/uL (ref 0–0.1)
BASOS PCT: 0 %
Eosinophils Absolute: 0 10*3/uL (ref 0–0.7)
Eosinophils Relative: 0 %
HEMATOCRIT: 37.2 % (ref 35.0–47.0)
HEMOGLOBIN: 12.3 g/dL (ref 12.0–16.0)
LYMPHS PCT: 15 %
Lymphs Abs: 1.6 10*3/uL (ref 1.0–3.6)
MCH: 24 pg — ABNORMAL LOW (ref 26.0–34.0)
MCHC: 33 g/dL (ref 32.0–36.0)
MCV: 72.7 fL — AB (ref 80.0–100.0)
MONO ABS: 0.7 10*3/uL (ref 0.2–0.9)
Monocytes Relative: 6 %
NEUTROS ABS: 8.2 10*3/uL — AB (ref 1.4–6.5)
NEUTROS PCT: 79 %
Platelets: 313 10*3/uL (ref 150–440)
RBC: 5.11 MIL/uL (ref 3.80–5.20)
RDW: 15.1 % — AB (ref 11.5–14.5)
WBC: 10.6 10*3/uL (ref 3.6–11.0)

## 2016-06-04 LAB — LIPASE, BLOOD: Lipase: 19 U/L (ref 11–51)

## 2016-06-04 MED ORDER — ONDANSETRON HCL 4 MG/2ML IJ SOLN
INTRAMUSCULAR | Status: AC
  Administered 2016-06-04: 4 mg via INTRAVENOUS
  Filled 2016-06-04: qty 2

## 2016-06-04 MED ORDER — IOPAMIDOL (ISOVUE-370) INJECTION 76%
100.0000 mL | Freq: Once | INTRAVENOUS | Status: AC | PRN
  Administered 2016-06-04: 100 mL via INTRAVENOUS

## 2016-06-04 MED ORDER — MORPHINE SULFATE (PF) 4 MG/ML IV SOLN
4.0000 mg | Freq: Once | INTRAVENOUS | Status: AC
  Administered 2016-06-04: 4 mg via INTRAVENOUS

## 2016-06-04 MED ORDER — ONDANSETRON HCL 4 MG/2ML IJ SOLN
4.0000 mg | Freq: Once | INTRAMUSCULAR | Status: AC
  Administered 2016-06-04: 4 mg via INTRAVENOUS

## 2016-06-04 MED ORDER — CYCLOBENZAPRINE HCL 10 MG PO TABS: 10.0000 mg | ORAL_TABLET | Freq: Three times a day (TID) | ORAL | 0 refills | Status: DC | PRN

## 2016-06-04 MED ORDER — MORPHINE SULFATE (PF) 4 MG/ML IV SOLN
INTRAVENOUS | Status: AC
  Administered 2016-06-04: 4 mg via INTRAVENOUS
  Filled 2016-06-04: qty 1

## 2016-06-04 NOTE — ED Notes (Signed)
Patient's discharge and follow up information reviewed with patient by ED nursing staff and patient given the opportunity to ask questions pertaining to ED visit and discharge plan of care. Patient advised that should symptoms not continue to improve, resolve entirely, or should new symptoms develop then a follow up visit with their PCP or a return visit to the ED may be warranted. Patient verbalized consent and understanding of discharge plan of care including potential need for further evaluation. Patient discharged in stable condition per attending ED physician on duty.   Pt called her sister to come and pick her up.

## 2016-06-04 NOTE — Discharge Instructions (Signed)
You were evaluated for low back pain, and then also some abdominal pain, and although no certain cause was found, your examine evaluation are reassuring in the emergency department today. As we discussed, I suspect your back pain is due to muscle spasm in the back. Please use heating pad as she can. Please follow-up with the primary care doctor as you may benefit from a referral for physical therapy and/or massage therapy. Please take over-the-counter ibuprofen 400-600 mg every 8 hours as needed for pain. I will refill the Flexeril.  Return to the emergency department for any new or worsening abdominal pain, back pain, any weakness or numbness, tingling or pooping on yourself which is called incontinence, or any other symptoms concerning to you.
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
## 2017-05-03 ENCOUNTER — Ambulatory Visit
Admission: RE | Admit: 2017-05-03 | Discharge: 2017-05-03 | Disposition: A | Discharge status: Home / Self Care | Payer: Medicare Other | Source: Ambulatory Visit | Attending: Orthopedic Surgery | Admitting: Orthopedic Surgery

## 2017-05-03 ENCOUNTER — Other Ambulatory Visit: Payer: Self-pay | Admitting: Orthopedic Surgery

## 2017-05-03 ENCOUNTER — Ambulatory Visit: Payer: Medicare Other

## 2017-05-03 DIAGNOSIS — R600 Localized edema: Secondary | ICD-10-CM | POA: Diagnosis not present

## 2017-05-03 DIAGNOSIS — M7989 Other specified soft tissue disorders: Secondary | ICD-10-CM

## 2017-05-04 ENCOUNTER — Other Ambulatory Visit: Payer: Self-pay | Admitting: Orthopedic Surgery

## 2017-05-04 DIAGNOSIS — M5136 Other intervertebral disc degeneration, lumbar region: Secondary | ICD-10-CM

## 2017-05-04 DIAGNOSIS — M5459 Other low back pain: Secondary | ICD-10-CM

## 2017-05-04 DIAGNOSIS — M5416 Radiculopathy, lumbar region: Secondary | ICD-10-CM

## 2017-05-04 DIAGNOSIS — M7989 Other specified soft tissue disorders: Secondary | ICD-10-CM

## 2017-05-04 DIAGNOSIS — M545 Low back pain: Secondary | ICD-10-CM

## 2017-05-04 DIAGNOSIS — R600 Localized edema: Secondary | ICD-10-CM

## 2017-05-17 ENCOUNTER — Ambulatory Visit
Admission: RE | Admit: 2017-05-17 | Discharge: 2017-05-17 | Disposition: A | Discharge status: Home / Self Care | Payer: Medicare Other | Source: Ambulatory Visit | Attending: Orthopedic Surgery | Admitting: Orthopedic Surgery

## 2017-05-17 DIAGNOSIS — M5136 Other intervertebral disc degeneration, lumbar region: Secondary | ICD-10-CM

## 2017-05-17 DIAGNOSIS — M545 Low back pain: Secondary | ICD-10-CM | POA: Insufficient documentation

## 2017-05-17 DIAGNOSIS — M5416 Radiculopathy, lumbar region: Secondary | ICD-10-CM | POA: Diagnosis not present

## 2017-05-17 DIAGNOSIS — R600 Localized edema: Secondary | ICD-10-CM | POA: Insufficient documentation

## 2017-05-17 DIAGNOSIS — M5459 Other low back pain: Secondary | ICD-10-CM

## 2017-05-17 DIAGNOSIS — M5137 Other intervertebral disc degeneration, lumbosacral region: Secondary | ICD-10-CM | POA: Insufficient documentation

## 2017-05-17 DIAGNOSIS — M7989 Other specified soft tissue disorders: Secondary | ICD-10-CM | POA: Diagnosis present

## 2017-05-17 DIAGNOSIS — M47816 Spondylosis without myelopathy or radiculopathy, lumbar region: Secondary | ICD-10-CM | POA: Diagnosis not present

## 2017-05-21 ENCOUNTER — Other Ambulatory Visit: Payer: Self-pay | Admitting: Orthopedic Surgery

## 2017-05-21 DIAGNOSIS — M5416 Radiculopathy, lumbar region: Secondary | ICD-10-CM

## 2017-06-04 ENCOUNTER — Ambulatory Visit: Payer: Medicare Other

## 2017-06-22 ENCOUNTER — Other Ambulatory Visit: Payer: Self-pay | Admitting: Internal Medicine

## 2017-06-25 ENCOUNTER — Ambulatory Visit: Payer: Medicare Other

## 2017-06-25 ENCOUNTER — Ambulatory Visit: Admission: RE | Admit: 2017-06-25 | Payer: Medicare Other | Source: Ambulatory Visit

## 2017-06-25 ENCOUNTER — Ambulatory Visit
Admission: RE | Admit: 2017-06-25 | Discharge: 2017-06-25 | Disposition: A | Discharge status: Home / Self Care | Payer: Medicare Other | Source: Ambulatory Visit | Attending: Orthopedic Surgery | Admitting: Orthopedic Surgery

## 2017-06-25 ENCOUNTER — Other Ambulatory Visit: Payer: Self-pay | Admitting: Orthopedic Surgery

## 2017-06-25 DIAGNOSIS — M5416 Radiculopathy, lumbar region: Secondary | ICD-10-CM

## 2017-06-25 NOTE — Discharge Instructions (Signed)
Myelogram, Care After °Refer to this sheet in the next few weeks. These instructions provide you with information about caring for yourself after your procedure. Your health care provider may also give you more specific instructions. Your treatment has been planned according to current medical practices, but problems sometimes occur. Call your health care provider if you have any problems or questions after your procedure. °What can I expect after the procedure? °After the procedure, it is common to have: °· Soreness at your injection site. °· A mild headache. ° °Follow these instructions at home: °· Drink enough fluid to keep your urine clear or pale yellow. This will help flush out the dye (contrast material) from your spine. °· Rest as told by your health care provider. Lie flat with your head slightly raised (elevated) to reduce the risk of headache. °· Do not bend, lift, or do any strenuous activity for 24-48 hours or as told by your health care provider. °· Take over-the-counter and prescription medicines only as told by your health care provider. °· Take care of and remove your bandage (dressing) as told by your health care provider. °· Bathe or shower as told by your health care provider. °Contact a health care provider if: °· You have a fever. °· You have a headache that lasts longer than 24 hours. °· You feel nauseous or vomit. °· You have a stiff neck or numbness in your legs. °· You are unable to urinate or have a bowel movement. °· You develop a rash, itching, or sneezing. °Get help right away if: °· You have new symptoms or your symptoms get worse. °· You have a seizure. °· You have trouble breathing. °This information is not intended to replace advice given to you by your health care provider. Make sure you discuss any questions you have with your health care provider. °Document Released: 08/09/2015 Document Revised: 12/19/2015 Document Reviewed: 04/25/2015 °Elsevier Interactive Patient Education ©  2018 [employer] ° °

## 2017-06-29 ENCOUNTER — Ambulatory Visit: Payer: Medicare Other

## 2017-08-10 ENCOUNTER — Encounter: Payer: Self-pay | Admitting: Physical Therapy

## 2017-08-10 ENCOUNTER — Ambulatory Visit: Payer: Medicare Other | Attending: Internal Medicine | Admitting: Physical Therapy

## 2017-08-10 ENCOUNTER — Other Ambulatory Visit: Payer: Self-pay

## 2017-08-10 DIAGNOSIS — R2681 Unsteadiness on feet: Secondary | ICD-10-CM | POA: Diagnosis present

## 2017-08-10 DIAGNOSIS — M6281 Muscle weakness (generalized): Secondary | ICD-10-CM | POA: Insufficient documentation

## 2017-08-10 NOTE — Patient Instructions (Signed)
Patient Victoria Gross had an appointment with PT at [facility] hospital for a wheelchair evaluation today 08/10/17  Today at 10:15 to 11:30
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
## 2017-10-11 ENCOUNTER — Other Ambulatory Visit: Payer: Self-pay | Admitting: Internal Medicine

## 2017-10-11 DIAGNOSIS — Z1231 Encounter for screening mammogram for malignant neoplasm of breast: Secondary | ICD-10-CM

## 2018-01-18 ENCOUNTER — Other Ambulatory Visit: Payer: Self-pay | Admitting: Internal Medicine

## 2018-01-18 ENCOUNTER — Other Ambulatory Visit: Payer: Self-pay | Admitting: Obstetrics & Gynecology

## 2018-01-18 DIAGNOSIS — N6323 Unspecified lump in the left breast, lower outer quadrant: Secondary | ICD-10-CM

## 2018-01-24 ENCOUNTER — Other Ambulatory Visit: Payer: Medicare Other

## 2018-02-07 ENCOUNTER — Ambulatory Visit
Admission: RE | Admit: 2018-02-07 | Discharge: 2018-02-07 | Disposition: A | Discharge status: Home / Self Care | Payer: Medicare Other | Source: Ambulatory Visit | Attending: Internal Medicine | Admitting: Internal Medicine

## 2018-02-07 DIAGNOSIS — N6323 Unspecified lump in the left breast, lower outer quadrant: Secondary | ICD-10-CM | POA: Diagnosis not present

## 2018-09-15 ENCOUNTER — Other Ambulatory Visit: Payer: Self-pay | Admitting: Internal Medicine

## 2018-09-15 DIAGNOSIS — R928 Other abnormal and inconclusive findings on diagnostic imaging of breast: Secondary | ICD-10-CM

## 2018-09-27 ENCOUNTER — Inpatient Hospital Stay: Admission: RE | Admit: 2018-09-27 | Payer: Medicare Other | Source: Ambulatory Visit

## 2018-09-27 ENCOUNTER — Other Ambulatory Visit: Payer: Medicare Other

## 2018-12-01 ENCOUNTER — Other Ambulatory Visit: Payer: Self-pay | Admitting: Internal Medicine

## 2018-12-01 DIAGNOSIS — M7989 Other specified soft tissue disorders: Principal | ICD-10-CM

## 2018-12-01 DIAGNOSIS — M79662 Pain in left lower leg: Secondary | ICD-10-CM

## 2018-12-01 DIAGNOSIS — L03116 Cellulitis of left lower limb: Secondary | ICD-10-CM

## 2018-12-02 ENCOUNTER — Ambulatory Visit: Payer: Medicare Other

## 2018-12-08 ENCOUNTER — Ambulatory Visit: Admission: RE | Admit: 2018-12-08 | Payer: Medicare Other | Source: Ambulatory Visit

## 2018-12-08 ENCOUNTER — Other Ambulatory Visit: Payer: Self-pay

## 2018-12-08 ENCOUNTER — Ambulatory Visit
Admission: RE | Admit: 2018-12-08 | Discharge: 2018-12-08 | Disposition: A | Discharge status: Home / Self Care | Payer: Medicare Other | Source: Ambulatory Visit | Attending: Internal Medicine | Admitting: Internal Medicine

## 2018-12-08 DIAGNOSIS — M7989 Other specified soft tissue disorders: Secondary | ICD-10-CM | POA: Diagnosis present

## 2018-12-08 DIAGNOSIS — L03116 Cellulitis of left lower limb: Secondary | ICD-10-CM | POA: Diagnosis present

## 2018-12-08 DIAGNOSIS — M79662 Pain in left lower leg: Secondary | ICD-10-CM | POA: Insufficient documentation

## 2018-12-12 ENCOUNTER — Other Ambulatory Visit: Payer: Self-pay

## 2018-12-12 ENCOUNTER — Ambulatory Visit
Admission: RE | Admit: 2018-12-12 | Discharge: 2018-12-12 | Disposition: A | Discharge status: Home / Self Care | Payer: Medicare Other | Source: Ambulatory Visit | Attending: Internal Medicine | Admitting: Internal Medicine

## 2018-12-12 DIAGNOSIS — R928 Other abnormal and inconclusive findings on diagnostic imaging of breast: Secondary | ICD-10-CM | POA: Diagnosis present

## 2018-12-22 ENCOUNTER — Other Ambulatory Visit: Payer: Self-pay | Admitting: Internal Medicine

## 2018-12-22 DIAGNOSIS — N6310 Unspecified lump in the right breast, unspecified quadrant: Secondary | ICD-10-CM

## 2019-03-31 ENCOUNTER — Emergency Department: Payer: Medicare Other

## 2019-03-31 ENCOUNTER — Observation Stay
Admission: EM | Admit: 2019-03-31 | Discharge: 2019-04-02 | Disposition: A | Discharge status: Home / Self Care | Payer: Medicare Other | Attending: Internal Medicine | Admitting: Internal Medicine

## 2019-03-31 ENCOUNTER — Other Ambulatory Visit: Payer: Self-pay

## 2019-03-31 DIAGNOSIS — Z886 Allergy status to analgesic agent status: Secondary | ICD-10-CM | POA: Diagnosis not present

## 2019-03-31 DIAGNOSIS — F1010 Alcohol abuse, uncomplicated: Secondary | ICD-10-CM | POA: Insufficient documentation

## 2019-03-31 DIAGNOSIS — Z993 Dependence on wheelchair: Secondary | ICD-10-CM | POA: Insufficient documentation

## 2019-03-31 DIAGNOSIS — L0390 Cellulitis, unspecified: Secondary | ICD-10-CM | POA: Diagnosis present

## 2019-03-31 DIAGNOSIS — D509 Iron deficiency anemia, unspecified: Secondary | ICD-10-CM | POA: Insufficient documentation

## 2019-03-31 DIAGNOSIS — Z23 Encounter for immunization: Secondary | ICD-10-CM | POA: Insufficient documentation

## 2019-03-31 DIAGNOSIS — F17210 Nicotine dependence, cigarettes, uncomplicated: Secondary | ICD-10-CM | POA: Insufficient documentation

## 2019-03-31 DIAGNOSIS — Z20828 Contact with and (suspected) exposure to other viral communicable diseases: Secondary | ICD-10-CM | POA: Insufficient documentation

## 2019-03-31 DIAGNOSIS — Z87820 Personal history of traumatic brain injury: Secondary | ICD-10-CM | POA: Insufficient documentation

## 2019-03-31 DIAGNOSIS — Z9049 Acquired absence of other specified parts of digestive tract: Secondary | ICD-10-CM | POA: Diagnosis not present

## 2019-03-31 DIAGNOSIS — L03116 Cellulitis of left lower limb: Principal | ICD-10-CM

## 2019-03-31 DIAGNOSIS — Z888 Allergy status to other drugs, medicaments and biological substances status: Secondary | ICD-10-CM | POA: Diagnosis not present

## 2019-03-31 DIAGNOSIS — H5462 Unqualified visual loss, left eye, normal vision right eye: Secondary | ICD-10-CM | POA: Insufficient documentation

## 2019-03-31 DIAGNOSIS — I10 Essential (primary) hypertension: Secondary | ICD-10-CM | POA: Diagnosis not present

## 2019-03-31 DIAGNOSIS — F419 Anxiety disorder, unspecified: Secondary | ICD-10-CM | POA: Insufficient documentation

## 2019-03-31 DIAGNOSIS — Z79899 Other long term (current) drug therapy: Secondary | ICD-10-CM | POA: Insufficient documentation

## 2019-03-31 DIAGNOSIS — Z793 Long term (current) use of hormonal contraceptives: Secondary | ICD-10-CM | POA: Insufficient documentation

## 2019-03-31 DIAGNOSIS — M79605 Pain in left leg: Secondary | ICD-10-CM

## 2019-03-31 DIAGNOSIS — G40909 Epilepsy, unspecified, not intractable, without status epilepticus: Secondary | ICD-10-CM | POA: Insufficient documentation

## 2019-03-31 DIAGNOSIS — G8194 Hemiplegia, unspecified affecting left nondominant side: Secondary | ICD-10-CM | POA: Insufficient documentation

## 2019-03-31 MED ORDER — SODIUM CHLORIDE 0.9 % IV BOLUS
1000.0000 mL | Freq: Once | INTRAVENOUS | Status: AC
  Administered 2019-03-31: 1000 mL via INTRAVENOUS

## 2019-03-31 NOTE — ED Provider Notes (Deleted)
Patient presented to the emergency department complaining of left leg pain.  Patient was triaged.  Ultrasound was ordered.  Neither nursing staff nor ultrasound could find the patient prior to being roomed.  I have not seen or assessed the patient at this time.  Patient apparently left without being seen.   Darletta Moll, PA-C 03/31/19 2039

## 2019-03-31 NOTE — ED Provider Notes (Signed)
Angiocath insertion Performed by: Lavonia Drafts  Consent: Verbal consent obtained. Risks and benefits: risks, benefits and alternatives were discussed Time out: Immediately prior to procedure a "time out" was called to verify the correct patient, procedure, equipment, support staff and site/side marked as required.  Preparation: Patient was prepped and draped in the usual sterile fashion.  Vein Location: right brachial  Ultrasound Guided  Gauge: 18  Normal blood return and flush without difficulty Patient tolerance: Patient tolerated the procedure well with no immediate complications.      Lavonia Drafts, MD 03/31/19 2326

## 2019-03-31 NOTE — ED Triage Notes (Signed)
Left leg pain X "months". States her doctor is concerned for blood clot. Pain behind left knee. Pt nonambulatory. Pt alert and oriented X4, cooperative, RR even and unlabored, color WNL. Pt in NAD.
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
## 2019-04-01 ENCOUNTER — Other Ambulatory Visit: Payer: Self-pay

## 2019-04-01 DIAGNOSIS — L03116 Cellulitis of left lower limb: Secondary | ICD-10-CM | POA: Diagnosis not present

## 2019-04-01 DIAGNOSIS — L0390 Cellulitis, unspecified: Secondary | ICD-10-CM | POA: Diagnosis present

## 2019-04-01 LAB — COMPREHENSIVE METABOLIC PANEL
ALT: 14 U/L (ref 0–44)
AST: 20 U/L (ref 15–41)
Albumin: 3.5 g/dL (ref 3.5–5.0)
Alkaline Phosphatase: 80 U/L (ref 38–126)
Anion gap: 11 (ref 5–15)
BUN: 14 mg/dL (ref 6–20)
CO2: 21 mmol/L — ABNORMAL LOW (ref 22–32)
Calcium: 9 mg/dL (ref 8.9–10.3)
Chloride: 104 mmol/L (ref 98–111)
Creatinine, Ser: 0.76 mg/dL (ref 0.44–1.00)
GFR calc Af Amer: >60 mL/min (ref >60)
GFR calc non Af Amer: >60 mL/min (ref >60)
Glucose, Bld: 91 mg/dL (ref 70–99)
Potassium: 3.7 mmol/L (ref 3.5–5.1)
Sodium: 136 mmol/L (ref 135–145)
Total Bilirubin: 0.5 mg/dL (ref 0.3–1.2)
Total Protein: 8.4 g/dL — ABNORMAL HIGH (ref 6.5–8.1)

## 2019-04-01 LAB — CBC WITH DIFFERENTIAL/PLATELET
Abs Immature Granulocytes: 0.03 10*3/uL (ref 0.00–0.07)
Basophils Absolute: 0 10*3/uL (ref 0.0–0.1)
Basophils Relative: 0 %
Eosinophils Absolute: 0.2 10*3/uL (ref 0.0–0.5)
Eosinophils Relative: 3 %
HCT: 28 % — ABNORMAL LOW (ref 36.0–46.0)
Hemoglobin: 7.6 g/dL — ABNORMAL LOW (ref 12.0–15.0)
Immature Granulocytes: 0 %
Lymphocytes Relative: 26 %
Lymphs Abs: 2.5 10*3/uL (ref 0.7–4.0)
MCH: 15.9 pg — ABNORMAL LOW (ref 26.0–34.0)
MCHC: 27.1 g/dL — ABNORMAL LOW (ref 30.0–36.0)
MCV: 58.7 fL — ABNORMAL LOW (ref 80.0–100.0)
Monocytes Absolute: 0.6 10*3/uL (ref 0.1–1.0)
Monocytes Relative: 7 %
Neutro Abs: 6.1 10*3/uL (ref 1.7–7.7)
Neutrophils Relative %: 64 %
Platelets: 498 10*3/uL — ABNORMAL HIGH (ref 150–400)
RBC: 4.77 MIL/uL (ref 3.87–5.11)
RDW: 23.4 % — ABNORMAL HIGH (ref 11.5–15.5)
WBC: 9.5 10*3/uL (ref 4.0–10.5)
nRBC: 0 % (ref 0.0–0.2)

## 2019-04-01 LAB — SARS CORONAVIRUS 2 (TAT 6-24 HRS): SARS Coronavirus 2: NEGATIVE

## 2019-04-01 LAB — TSH: TSH: 2.046 u[IU]/mL (ref 0.350–4.500)

## 2019-04-01 LAB — FOLATE: Folate: 8.5 ng/mL (ref >5.9)

## 2019-04-01 LAB — IRON AND TIBC
Iron: 23 ug/dL — ABNORMAL LOW (ref 28–170)
Saturation Ratios: 5 % — ABNORMAL LOW (ref 10.4–31.8)
TIBC: 428 ug/dL (ref 250–450)
UIBC: 405 ug/dL

## 2019-04-01 LAB — LACTIC ACID, PLASMA: Lactic Acid, Venous: 1.1 mmol/L (ref 0.5–1.9)

## 2019-04-01 LAB — VITAMIN B12: Vitamin B-12: 121 pg/mL — ABNORMAL LOW (ref 180–914)

## 2019-04-01 LAB — FERRITIN: Ferritin: 12 ng/mL (ref 11–307)

## 2019-04-01 MED ORDER — ACETAMINOPHEN 325 MG PO TABS
650.0000 mg | ORAL_TABLET | Freq: Four times a day (QID) | ORAL | Status: DC | PRN
  Administered 2019-04-01 – 2019-04-02 (×4): 650 mg via ORAL
  Filled 2019-04-01 (×4): qty 2

## 2019-04-01 MED ORDER — CLINDAMYCIN PHOSPHATE 600 MG/50ML IV SOLN
600.0000 mg | Freq: Three times a day (TID) | INTRAVENOUS | Status: DC
  Administered 2019-04-01 – 2019-04-02 (×4): 600 mg via INTRAVENOUS
  Filled 2019-04-01 (×8): qty 50

## 2019-04-01 MED ORDER — ONDANSETRON HCL 4 MG/2ML IJ SOLN: 4.0000 mg | Freq: Four times a day (QID) | INTRAMUSCULAR | Status: DC | PRN

## 2019-04-01 MED ORDER — PNEUMOCOCCAL VAC POLYVALENT 25 MCG/0.5ML IJ INJ
0.5000 mL | INJECTION | INTRAMUSCULAR | Status: AC
  Administered 2019-04-02: 0.5 mL via INTRAMUSCULAR
  Filled 2019-04-01: qty 0.5

## 2019-04-01 MED ORDER — DOCUSATE SODIUM 100 MG PO CAPS
100.0000 mg | ORAL_CAPSULE | Freq: Two times a day (BID) | ORAL | Status: DC
  Administered 2019-04-01 – 2019-04-02 (×3): 100 mg via ORAL
  Filled 2019-04-01 (×3): qty 1

## 2019-04-01 MED ORDER — ONDANSETRON HCL 4 MG PO TABS: 4.0000 mg | ORAL_TABLET | Freq: Four times a day (QID) | ORAL | Status: DC | PRN

## 2019-04-01 MED ORDER — SODIUM CHLORIDE 0.9 % IV SOLN
INTRAVENOUS | Status: DC | PRN
  Administered 2019-04-01 (×2): via INTRAVENOUS

## 2019-04-01 MED ORDER — ENOXAPARIN SODIUM 40 MG/0.4ML ~~LOC~~ SOLN
40.0000 mg | SUBCUTANEOUS | Status: DC
  Administered 2019-04-01 – 2019-04-02 (×2): 40 mg via SUBCUTANEOUS
  Filled 2019-04-01 (×2): qty 0.4

## 2019-04-01 MED ORDER — ACETAMINOPHEN 650 MG RE SUPP: 650.0000 mg | Freq: Four times a day (QID) | RECTAL | Status: DC | PRN

## 2019-04-01 MED ORDER — SODIUM CHLORIDE 0.9 % IV SOLN
1.0000 g | Freq: Once | INTRAVENOUS | Status: AC
  Administered 2019-04-01: 1 g via INTRAVENOUS
  Filled 2019-04-01: qty 10

## 2019-04-01 NOTE — Care Management Obs Status (Signed)
[place] NOTIFICATION   Patient Details  Name: ELAYA DROEGE MRN: [id number] Date of Birth: [birth year]   Medicare Observation Status Notification Given:  Yes    Shelbie Hutching, RN 04/01/2019, 11:03 AM
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
## 2019-04-01 NOTE — Progress Notes (Signed)
Patient admitted early this morning.  Seen and examined by me later in the morning.  She is feeling about the same.  She endorses continued left lower extremity "soreness".  She has no other concerns this morning.  No fevers or chills.  On exam, her left lower extremity is edematous and warm to the touch.  She does have some chronic venous stasis changes bilaterally.  -Will continue IV clindamycin for today, plan to transition to p.o. antibiotics tomorrow -Follow-up blood cultures -Patient noted to have a microcytic anemia- will check an anemia panel and FOBT  Hyman Bible, MD
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
## 2019-04-01 NOTE — ED Notes (Signed)
Assumed care of patient aox3, patient from NH sent to main ed from flex. IV ABX started. Vss awaiting further paln of care.
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
## 2019-04-02 DIAGNOSIS — L03116 Cellulitis of left lower limb: Secondary | ICD-10-CM | POA: Diagnosis not present

## 2019-04-02 LAB — BASIC METABOLIC PANEL
Anion gap: 9 (ref 5–15)
BUN: 13 mg/dL (ref 6–20)
CO2: 23 mmol/L (ref 22–32)
Calcium: 8.8 mg/dL — ABNORMAL LOW (ref 8.9–10.3)
Chloride: 105 mmol/L (ref 98–111)
Creatinine, Ser: 0.84 mg/dL (ref 0.44–1.00)
GFR calc Af Amer: >60 mL/min (ref >60)
GFR calc non Af Amer: >60 mL/min (ref >60)
Glucose, Bld: 107 mg/dL — ABNORMAL HIGH (ref 70–99)
Potassium: 3.8 mmol/L (ref 3.5–5.1)
Sodium: 137 mmol/L (ref 135–145)

## 2019-04-02 LAB — CBC
HCT: 28.9 % — ABNORMAL LOW (ref 36.0–46.0)
Hemoglobin: 8 g/dL — ABNORMAL LOW (ref 12.0–15.0)
MCH: 16.2 pg — ABNORMAL LOW (ref 26.0–34.0)
MCHC: 27.7 g/dL — ABNORMAL LOW (ref 30.0–36.0)
MCV: 58.4 fL — ABNORMAL LOW (ref 80.0–100.0)
Platelets: 506 10*3/uL — ABNORMAL HIGH (ref 150–400)
RBC: 4.95 MIL/uL (ref 3.87–5.11)
RDW: 23.2 % — ABNORMAL HIGH (ref 11.5–15.5)
WBC: 7.8 10*3/uL (ref 4.0–10.5)
nRBC: 0 % (ref 0.0–0.2)

## 2019-04-02 MED ORDER — FERROUS SULFATE 325 (65 FE) MG PO TBEC: 325.0000 mg | DELAYED_RELEASE_TABLET | Freq: Two times a day (BID) | ORAL | 0 refills | Status: DC

## 2019-04-02 MED ORDER — DOXYCYCLINE MONOHYDRATE 100 MG PO TABS: 100.0000 mg | ORAL_TABLET | Freq: Two times a day (BID) | ORAL | 0 refills | Status: DC
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
## 2019-04-02 NOTE — ED Provider Notes (Signed)
I assumed care of the patient from [facility] PA.  I evaluated patient whose clinical exam is consistent with left lower extremity cellulitis.  Laboratory data unremarkable left lower extremity ultrasound venous study revealed no evidence of DVT.  Given extensive area of cellulitis patient given IV ceftriaxone and discussed with Dr. Marcille Blanco for hospital admission   Gregor Hams, MD 04/02/19 [phone]

## 2019-04-02 NOTE — Discharge Instructions (Signed)
It was so nice to meet you during this hospitalization!  You came into the hospital with left leg swelling and pain. We think you have a bacterial infection of the skin of your left leg. We are giving you antibiotics to treat this. Please take Doxycycline 100mg  twice a day, starting tonight and then continuing twice a day for 5 more days.  We also found that your hemoglobin was low. This is because you have low levels of iron. We cannot give you iron when you have an infection, so you will need to take the iron pills twice a day after you are finished taking all of the antibiotics.   Take care, Dr. Brett Albino
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
## 2019-04-02 NOTE — Progress Notes (Signed)
IV removed. D/C instructions reviewed with patient and verbalized understanding. Patient transported to private vehicle via wheelchair.

## 2019-04-04 LAB — CULTURE, BLOOD (ROUTINE X 2): Special Requests: ADEQUATE

## 2019-05-01 ENCOUNTER — Other Ambulatory Visit: Payer: Self-pay | Admitting: Physician Assistant

## 2019-05-01 DIAGNOSIS — R0609 Other forms of dyspnea: Secondary | ICD-10-CM

## 2019-05-01 DIAGNOSIS — R0789 Other chest pain: Secondary | ICD-10-CM

## 2019-05-11 ENCOUNTER — Other Ambulatory Visit: Payer: Medicare Other

## 2019-05-11 ENCOUNTER — Ambulatory Visit: Payer: Medicare Other

## 2019-05-18 ENCOUNTER — Ambulatory Visit: Payer: Medicare Other

## 2019-05-23 ENCOUNTER — Other Ambulatory Visit: Payer: Medicare Other

## 2019-05-29 ENCOUNTER — Ambulatory Visit: Payer: Medicare Other

## 2019-05-30 ENCOUNTER — Ambulatory Visit: Admission: RE | Admit: 2019-05-30 | Payer: Medicare Other | Source: Ambulatory Visit

## 2019-05-30 ENCOUNTER — Ambulatory Visit: Payer: Medicare Other

## 2019-07-03 ENCOUNTER — Other Ambulatory Visit: Payer: Self-pay | Admitting: Internal Medicine

## 2019-07-03 DIAGNOSIS — N6310 Unspecified lump in the right breast, unspecified quadrant: Secondary | ICD-10-CM

## 2019-09-16 ENCOUNTER — Emergency Department
Admission: EM | Admit: 2019-09-16 | Discharge: 2019-09-16 | Disposition: A | Discharge status: Home / Self Care | Payer: Medicare Other | Attending: Emergency Medicine | Admitting: Emergency Medicine

## 2019-09-16 ENCOUNTER — Emergency Department: Payer: Medicare Other

## 2019-09-16 ENCOUNTER — Other Ambulatory Visit: Payer: Self-pay

## 2019-09-16 DIAGNOSIS — F17210 Nicotine dependence, cigarettes, uncomplicated: Secondary | ICD-10-CM | POA: Insufficient documentation

## 2019-09-16 DIAGNOSIS — I10 Essential (primary) hypertension: Secondary | ICD-10-CM | POA: Insufficient documentation

## 2019-09-16 DIAGNOSIS — Z79899 Other long term (current) drug therapy: Secondary | ICD-10-CM | POA: Diagnosis not present

## 2019-09-16 DIAGNOSIS — R079 Chest pain, unspecified: Secondary | ICD-10-CM | POA: Insufficient documentation

## 2019-09-16 DIAGNOSIS — R0789 Other chest pain: Secondary | ICD-10-CM

## 2019-09-16 LAB — CBC WITH DIFFERENTIAL/PLATELET
Abs Immature Granulocytes: 0.05 10*3/uL (ref 0.00–0.07)
Basophils Absolute: 0 10*3/uL (ref 0.0–0.1)
Basophils Relative: 0 %
Eosinophils Absolute: 0 10*3/uL (ref 0.0–0.5)
Eosinophils Relative: 0 %
HCT: 34.6 % — ABNORMAL LOW (ref 36.0–46.0)
Hemoglobin: 9.7 g/dL — ABNORMAL LOW (ref 12.0–15.0)
Immature Granulocytes: 0 %
Lymphocytes Relative: 11 %
Lymphs Abs: 1.3 10*3/uL (ref 0.7–4.0)
MCH: 17.1 pg — ABNORMAL LOW (ref 26.0–34.0)
MCHC: 28 g/dL — ABNORMAL LOW (ref 30.0–36.0)
MCV: 61 fL — ABNORMAL LOW (ref 80.0–100.0)
Monocytes Absolute: 1 10*3/uL (ref 0.1–1.0)
Monocytes Relative: 8 %
Neutro Abs: 9.7 10*3/uL — ABNORMAL HIGH (ref 1.7–7.7)
Neutrophils Relative %: 81 %
Platelets: 498 10*3/uL — ABNORMAL HIGH (ref 150–400)
RBC: 5.67 MIL/uL — ABNORMAL HIGH (ref 3.87–5.11)
RDW: 23.5 % — ABNORMAL HIGH (ref 11.5–15.5)
Smear Review: NORMAL
WBC: 12.1 10*3/uL — ABNORMAL HIGH (ref 4.0–10.5)
nRBC: 0 % (ref 0.0–0.2)

## 2019-09-16 LAB — COMPREHENSIVE METABOLIC PANEL
ALT: 17 U/L (ref 0–44)
AST: 39 U/L (ref 15–41)
Albumin: 3.9 g/dL (ref 3.5–5.0)
Alkaline Phosphatase: 83 U/L (ref 38–126)
Anion gap: 12 (ref 5–15)
BUN: 10 mg/dL (ref 6–20)
CO2: 23 mmol/L (ref 22–32)
Calcium: 9 mg/dL (ref 8.9–10.3)
Chloride: 98 mmol/L (ref 98–111)
Creatinine, Ser: 0.73 mg/dL (ref 0.44–1.00)
GFR calc Af Amer: >60 mL/min (ref >60)
GFR calc non Af Amer: >60 mL/min (ref >60)
Glucose, Bld: 121 mg/dL — ABNORMAL HIGH (ref 70–99)
Potassium: 5.2 mmol/L — ABNORMAL HIGH (ref 3.5–5.1)
Sodium: 133 mmol/L — ABNORMAL LOW (ref 135–145)
Total Bilirubin: 0.9 mg/dL (ref 0.3–1.2)
Total Protein: 9.9 g/dL — ABNORMAL HIGH (ref 6.5–8.1)

## 2019-09-16 LAB — TROPONIN I (HIGH SENSITIVITY)
Troponin I (High Sensitivity): 5 ng/L (ref <18)
Troponin I (High Sensitivity): 6 ng/L (ref <18)

## 2019-09-16 LAB — BRAIN NATRIURETIC PEPTIDE: B Natriuretic Peptide: 51 pg/mL (ref 0.0–100.0)

## 2019-09-16 LAB — FIBRIN DERIVATIVES D-DIMER (ARMC ONLY): Fibrin derivatives D-dimer (ARMC): 1043.65 ng/mL (FEU) — ABNORMAL HIGH (ref 0.00–499.00)

## 2019-09-16 MED ORDER — MORPHINE SULFATE (PF) 4 MG/ML IV SOLN
4.0000 mg | Freq: Once | INTRAVENOUS | Status: AC
  Administered 2019-09-16: 4 mg via INTRAVENOUS
  Filled 2019-09-16: qty 1

## 2019-09-16 MED ORDER — SODIUM CHLORIDE 0.9 % IV BOLUS
500.0000 mL | Freq: Once | INTRAVENOUS | Status: AC
  Administered 2019-09-16: 500 mL via INTRAVENOUS

## 2019-09-16 MED ORDER — LIDOCAINE 5 % EX PTCH
1.0000 | MEDICATED_PATCH | CUTANEOUS | Status: DC
  Administered 2019-09-16: 1 via TRANSDERMAL
  Filled 2019-09-16: qty 1

## 2019-09-16 MED ORDER — ONDANSETRON HCL 4 MG/2ML IJ SOLN
4.0000 mg | Freq: Once | INTRAMUSCULAR | Status: AC
  Administered 2019-09-16: 4 mg via INTRAVENOUS
  Filled 2019-09-16: qty 2

## 2019-09-16 MED ORDER — IOHEXOL 350 MG/ML SOLN
75.0000 mL | Freq: Once | INTRAVENOUS | Status: AC | PRN
  Administered 2019-09-16: 75 mL via INTRAVENOUS

## 2019-09-16 MED ORDER — ACETAMINOPHEN 500 MG PO TABS
1000.0000 mg | ORAL_TABLET | Freq: Once | ORAL | Status: AC
  Administered 2019-09-16: 1000 mg via ORAL
  Filled 2019-09-16: qty 2
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
## 2019-09-16 NOTE — ED Notes (Signed)
Pt cleaned by this RN and Chief Operating Officer. Clean brief placed on pt, pt dressed in blue paper scrubs.

## 2019-09-16 NOTE — ED Notes (Signed)
IV team consult placed due to multiple failed IV attempts by this RN and Paulino Rily

## 2019-09-16 NOTE — ED Notes (Signed)
Pt taken to CT.

## 2019-09-16 NOTE — ED Provider Notes (Signed)
49 year old female initially presented to the ED complaining of sharp central chest pain as well as a cough for the past 3 days.  She was recently tested for Covid with negative results, but has not been ongoing chest pain, denies any fevers or shortness of breath.  No evidence of ACS today, EKG without acute ischemic changes and 2 sets of troponin are negative.  She was previously evaluated by cardiology for similar complaints, when work-ups were negative but she has had difficulty following up for stress testing.  D-dimer elevated here, however CTA is negative for PE or other acute process.  On reevaluation, patient initially stated that chest pain had resolved, but it later recurred during her ED visit.  It was reproducible on palpation and I suspect musculoskeletal etiology of her pain.  She was treated with Tylenol and Lidoderm patch with improvement, is now appropriate for discharge home with plan for follow-up with her PCP and cardiology.   Chesley Noon, MD 09/16/19 2312

## 2019-09-16 NOTE — ED Notes (Signed)
Pt placed on clean pads and new purewick placed.
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
## 2019-09-16 NOTE — ED Notes (Signed)
IV team at bedside 

## 2019-09-16 NOTE — ED Notes (Signed)
Pt verbalized discharge instructions and d/c with EMS to home at this time.

## 2019-10-10 DIAGNOSIS — R0789 Other chest pain: Secondary | ICD-10-CM | POA: Insufficient documentation

## 2019-10-23 ENCOUNTER — Encounter (INDEPENDENT_AMBULATORY_CARE_PROVIDER_SITE_OTHER): Payer: Medicare Other | Admitting: Vascular Surgery

## 2019-11-02 ENCOUNTER — Ambulatory Visit (INDEPENDENT_AMBULATORY_CARE_PROVIDER_SITE_OTHER): Payer: Medicare Other | Admitting: Vascular Surgery

## 2019-11-02 ENCOUNTER — Other Ambulatory Visit: Payer: Self-pay

## 2019-11-02 ENCOUNTER — Encounter (INDEPENDENT_AMBULATORY_CARE_PROVIDER_SITE_OTHER): Payer: Self-pay | Admitting: Vascular Surgery

## 2019-11-02 DIAGNOSIS — K219 Gastro-esophageal reflux disease without esophagitis: Secondary | ICD-10-CM

## 2019-11-02 DIAGNOSIS — I10 Essential (primary) hypertension: Secondary | ICD-10-CM

## 2019-11-02 DIAGNOSIS — I890 Lymphedema, not elsewhere classified: Secondary | ICD-10-CM | POA: Insufficient documentation

## 2019-11-02 MED ORDER — TRAMADOL HCL 50 MG PO TABS: 50.0000 mg | ORAL_TABLET | Freq: Four times a day (QID) | ORAL | 0 refills | Status: DC | PRN

## 2019-11-02 MED ORDER — DOXYCYCLINE HYCLATE 100 MG PO CAPS: 100.0000 mg | ORAL_CAPSULE | Freq: Two times a day (BID) | ORAL | 0 refills | Status: DC

## 2019-11-02 NOTE — Addendum Note (Signed)
Addended by: Renford Dills on: 11/02/2019 02:29 PM   Modules accepted: Orders
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
## 2019-11-06 ENCOUNTER — Telehealth (INDEPENDENT_AMBULATORY_CARE_PROVIDER_SITE_OTHER): Payer: Self-pay | Admitting: Vascular Surgery

## 2019-11-06 ENCOUNTER — Other Ambulatory Visit (INDEPENDENT_AMBULATORY_CARE_PROVIDER_SITE_OTHER): Payer: Self-pay | Admitting: Vascular Surgery

## 2019-11-06 NOTE — Telephone Encounter (Signed)
Patient was seen on April 8th 2021. RX that was called in but is not at the pharmacy. Please advise.

## 2019-11-06 NOTE — Telephone Encounter (Signed)
I spoke with the patient and explained that per the system her pharmacy confirmed receipt of the medication Tramadol and Doxycycline on 11/02/19 at the patient's pharmacy. I advised that they call them back and have them check again.

## 2019-11-08 NOTE — Telephone Encounter (Signed)
Patient called requesting for doxycycline and Tramadol to be called into [facility]. I left a message with on pharmacy voicemail and pharmacy has been change in patient chart

## 2019-11-16 ENCOUNTER — Encounter (INDEPENDENT_AMBULATORY_CARE_PROVIDER_SITE_OTHER): Payer: Self-pay

## 2019-11-17 ENCOUNTER — Other Ambulatory Visit (INDEPENDENT_AMBULATORY_CARE_PROVIDER_SITE_OTHER): Payer: Self-pay | Admitting: Vascular Surgery

## 2019-11-17 DIAGNOSIS — I890 Lymphedema, not elsewhere classified: Secondary | ICD-10-CM

## 2019-11-20 ENCOUNTER — Encounter (INDEPENDENT_AMBULATORY_CARE_PROVIDER_SITE_OTHER): Payer: Medicare Other

## 2019-11-20 ENCOUNTER — Ambulatory Visit (INDEPENDENT_AMBULATORY_CARE_PROVIDER_SITE_OTHER): Payer: Medicare Other | Admitting: Vascular Surgery

## 2019-11-20 ENCOUNTER — Encounter (INDEPENDENT_AMBULATORY_CARE_PROVIDER_SITE_OTHER): Payer: Self-pay

## 2019-12-07 ENCOUNTER — Ambulatory Visit (INDEPENDENT_AMBULATORY_CARE_PROVIDER_SITE_OTHER): Payer: Medicare Other | Admitting: Vascular Surgery

## 2019-12-07 ENCOUNTER — Encounter (INDEPENDENT_AMBULATORY_CARE_PROVIDER_SITE_OTHER): Payer: Medicare Other

## 2020-01-21 ENCOUNTER — Other Ambulatory Visit: Payer: Self-pay

## 2020-01-21 ENCOUNTER — Encounter: Payer: Self-pay | Admitting: Emergency Medicine

## 2020-01-21 DIAGNOSIS — Z9049 Acquired absence of other specified parts of digestive tract: Secondary | ICD-10-CM

## 2020-01-21 DIAGNOSIS — M79605 Pain in left leg: Secondary | ICD-10-CM | POA: Diagnosis present

## 2020-01-21 DIAGNOSIS — Z79899 Other long term (current) drug therapy: Secondary | ICD-10-CM

## 2020-01-21 DIAGNOSIS — M7989 Other specified soft tissue disorders: Secondary | ICD-10-CM | POA: Diagnosis present

## 2020-01-21 DIAGNOSIS — Z20822 Contact with and (suspected) exposure to covid-19: Secondary | ICD-10-CM | POA: Diagnosis present

## 2020-01-21 DIAGNOSIS — Z886 Allergy status to analgesic agent status: Secondary | ICD-10-CM

## 2020-01-21 DIAGNOSIS — G4700 Insomnia, unspecified: Secondary | ICD-10-CM | POA: Diagnosis present

## 2020-01-21 DIAGNOSIS — N83202 Unspecified ovarian cyst, left side: Secondary | ICD-10-CM | POA: Diagnosis present

## 2020-01-21 DIAGNOSIS — R410 Disorientation, unspecified: Secondary | ICD-10-CM | POA: Diagnosis not present

## 2020-01-21 DIAGNOSIS — D519 Vitamin B12 deficiency anemia, unspecified: Secondary | ICD-10-CM | POA: Diagnosis present

## 2020-01-21 DIAGNOSIS — H5462 Unqualified visual loss, left eye, normal vision right eye: Secondary | ICD-10-CM | POA: Diagnosis present

## 2020-01-21 DIAGNOSIS — N3001 Acute cystitis with hematuria: Principal | ICD-10-CM | POA: Diagnosis present

## 2020-01-21 DIAGNOSIS — G40909 Epilepsy, unspecified, not intractable, without status epilepticus: Secondary | ICD-10-CM | POA: Diagnosis present

## 2020-01-21 DIAGNOSIS — K5900 Constipation, unspecified: Secondary | ICD-10-CM | POA: Diagnosis present

## 2020-01-21 DIAGNOSIS — G8324 Monoplegia of upper limb affecting left nondominant side: Secondary | ICD-10-CM | POA: Diagnosis present

## 2020-01-21 DIAGNOSIS — K219 Gastro-esophageal reflux disease without esophagitis: Secondary | ICD-10-CM | POA: Diagnosis present

## 2020-01-21 DIAGNOSIS — W3400XS Accidental discharge from unspecified firearms or gun, sequela: Secondary | ICD-10-CM

## 2020-01-21 DIAGNOSIS — I10 Essential (primary) hypertension: Secondary | ICD-10-CM | POA: Diagnosis present

## 2020-01-21 DIAGNOSIS — N83201 Unspecified ovarian cyst, right side: Secondary | ICD-10-CM | POA: Diagnosis present

## 2020-01-21 DIAGNOSIS — D500 Iron deficiency anemia secondary to blood loss (chronic): Secondary | ICD-10-CM | POA: Diagnosis present

## 2020-01-21 DIAGNOSIS — F419 Anxiety disorder, unspecified: Secondary | ICD-10-CM | POA: Diagnosis present

## 2020-01-21 DIAGNOSIS — F329 Major depressive disorder, single episode, unspecified: Secondary | ICD-10-CM | POA: Diagnosis present

## 2020-01-21 DIAGNOSIS — Z7401 Bed confinement status: Secondary | ICD-10-CM

## 2020-01-21 DIAGNOSIS — Z6839 Body mass index (BMI) 39.0-39.9, adult: Secondary | ICD-10-CM

## 2020-01-21 DIAGNOSIS — G9341 Metabolic encephalopathy: Secondary | ICD-10-CM | POA: Diagnosis present

## 2020-01-21 DIAGNOSIS — F17210 Nicotine dependence, cigarettes, uncomplicated: Secondary | ICD-10-CM | POA: Diagnosis present

## 2020-01-21 DIAGNOSIS — B1920 Unspecified viral hepatitis C without hepatic coma: Secondary | ICD-10-CM | POA: Diagnosis present

## 2020-01-21 DIAGNOSIS — F1010 Alcohol abuse, uncomplicated: Secondary | ICD-10-CM | POA: Diagnosis present

## 2020-01-21 LAB — COMPREHENSIVE METABOLIC PANEL
ALT: 10 U/L (ref 0–44)
AST: 12 U/L — ABNORMAL LOW (ref 15–41)
Albumin: 3.3 g/dL — ABNORMAL LOW (ref 3.5–5.0)
Alkaline Phosphatase: 73 U/L (ref 38–126)
Anion gap: 9 (ref 5–15)
BUN: 19 mg/dL (ref 6–20)
CO2: 24 mmol/L (ref 22–32)
Calcium: 8.6 mg/dL — ABNORMAL LOW (ref 8.9–10.3)
Chloride: 104 mmol/L (ref 98–111)
Creatinine, Ser: 1.11 mg/dL — ABNORMAL HIGH (ref 0.44–1.00)
GFR calc Af Amer: >60 mL/min (ref >60)
GFR calc non Af Amer: 59 mL/min — ABNORMAL LOW (ref >60)
Glucose, Bld: 92 mg/dL (ref 70–99)
Potassium: 4 mmol/L (ref 3.5–5.1)
Sodium: 137 mmol/L (ref 135–145)
Total Bilirubin: 0.5 mg/dL (ref 0.3–1.2)
Total Protein: 8.4 g/dL — ABNORMAL HIGH (ref 6.5–8.1)

## 2020-01-21 LAB — CBC
HCT: 29.5 % — ABNORMAL LOW (ref 36.0–46.0)
Hemoglobin: 8.3 g/dL — ABNORMAL LOW (ref 12.0–15.0)
MCH: 16.9 pg — ABNORMAL LOW (ref 26.0–34.0)
MCHC: 28.1 g/dL — ABNORMAL LOW (ref 30.0–36.0)
MCV: 60 fL — ABNORMAL LOW (ref 80.0–100.0)
Platelets: 427 10*3/uL — ABNORMAL HIGH (ref 150–400)
RBC: 4.92 MIL/uL (ref 3.87–5.11)
RDW: 24.1 % — ABNORMAL HIGH (ref 11.5–15.5)
WBC: 9.4 10*3/uL (ref 4.0–10.5)
nRBC: 0 % (ref 0.0–0.2)

## 2020-01-21 LAB — LIPASE, BLOOD: Lipase: 24 U/L (ref 11–51)
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
## 2020-01-21 NOTE — ED Triage Notes (Addendum)
Pt arrived via [facility] from home with reports of bilateral leg pain and swelling and abdominal pain. Pt reports constipation. Unknown when last BM was.  Pt does have strong odor of urine. PT states she wears a pull up.  Pt states sxs have been going on "for a while"  See First Nurse Note for additional information from EMS.

## 2020-01-22 ENCOUNTER — Emergency Department: Payer: Medicare Other

## 2020-01-22 ENCOUNTER — Inpatient Hospital Stay
Admission: EM | Admit: 2020-01-22 | Discharge: 2020-01-26 | DRG: 689 | Disposition: A | Discharge status: Skilled Nursing Facility | Payer: Medicare Other | Attending: Internal Medicine | Admitting: Internal Medicine

## 2020-01-22 ENCOUNTER — Encounter: Payer: Self-pay | Admitting: Radiology

## 2020-01-22 DIAGNOSIS — E66813 Obesity, class 3: Secondary | ICD-10-CM | POA: Diagnosis present

## 2020-01-22 DIAGNOSIS — F1010 Alcohol abuse, uncomplicated: Secondary | ICD-10-CM | POA: Diagnosis present

## 2020-01-22 DIAGNOSIS — F419 Anxiety disorder, unspecified: Secondary | ICD-10-CM | POA: Diagnosis present

## 2020-01-22 DIAGNOSIS — G40909 Epilepsy, unspecified, not intractable, without status epilepticus: Secondary | ICD-10-CM

## 2020-01-22 DIAGNOSIS — R410 Disorientation, unspecified: Secondary | ICD-10-CM

## 2020-01-22 DIAGNOSIS — G9341 Metabolic encephalopathy: Secondary | ICD-10-CM

## 2020-01-22 DIAGNOSIS — I10 Essential (primary) hypertension: Secondary | ICD-10-CM | POA: Diagnosis not present

## 2020-01-22 DIAGNOSIS — K219 Gastro-esophageal reflux disease without esophagitis: Secondary | ICD-10-CM | POA: Diagnosis present

## 2020-01-22 DIAGNOSIS — D509 Iron deficiency anemia, unspecified: Secondary | ICD-10-CM | POA: Diagnosis present

## 2020-01-22 DIAGNOSIS — B1920 Unspecified viral hepatitis C without hepatic coma: Secondary | ICD-10-CM | POA: Diagnosis present

## 2020-01-22 DIAGNOSIS — N390 Urinary tract infection, site not specified: Secondary | ICD-10-CM | POA: Diagnosis present

## 2020-01-22 DIAGNOSIS — H5440 Blindness, one eye, unspecified eye: Secondary | ICD-10-CM | POA: Diagnosis present

## 2020-01-22 DIAGNOSIS — N838 Other noninflammatory disorders of ovary, fallopian tube and broad ligament: Secondary | ICD-10-CM

## 2020-01-22 DIAGNOSIS — R109 Unspecified abdominal pain: Secondary | ICD-10-CM

## 2020-01-22 DIAGNOSIS — D500 Iron deficiency anemia secondary to blood loss (chronic): Secondary | ICD-10-CM

## 2020-01-22 DIAGNOSIS — D519 Vitamin B12 deficiency anemia, unspecified: Secondary | ICD-10-CM

## 2020-01-22 DIAGNOSIS — N3090 Cystitis, unspecified without hematuria: Secondary | ICD-10-CM

## 2020-01-22 LAB — URINALYSIS, COMPLETE (UACMP) WITH MICROSCOPIC
Bilirubin Urine: NEGATIVE
Glucose, UA: NEGATIVE mg/dL
Ketones, ur: NEGATIVE mg/dL
Nitrite: POSITIVE — AB
Protein, ur: 30 mg/dL — AB
Specific Gravity, Urine: 1.025 (ref 1.005–1.030)
WBC, UA: >50 WBC/hpf — ABNORMAL HIGH (ref 0–5)
pH: 5 (ref 5.0–8.0)

## 2020-01-22 LAB — POCT PREGNANCY, URINE: Preg Test, Ur: NEGATIVE

## 2020-01-22 LAB — SARS CORONAVIRUS 2 BY RT PCR (HOSPITAL ORDER, PERFORMED IN ~~LOC~~ HOSPITAL LAB): SARS Coronavirus 2: NEGATIVE

## 2020-01-22 LAB — BRAIN NATRIURETIC PEPTIDE: B Natriuretic Peptide: 34.9 pg/mL (ref 0.0–100.0)

## 2020-01-22 MED ORDER — FLUTICASONE PROPIONATE 50 MCG/ACT NA SUSP
1.0000 | Freq: Every day | NASAL | Status: DC | PRN
  Filled 2020-01-22: qty 16

## 2020-01-22 MED ORDER — LORAZEPAM 2 MG/ML IJ SOLN: 1.0000 mg | INTRAMUSCULAR | Status: AC | PRN

## 2020-01-22 MED ORDER — THIAMINE HCL 100 MG PO TABS
100.0000 mg | ORAL_TABLET | Freq: Every day | ORAL | Status: DC
  Administered 2020-01-22 – 2020-01-25 (×4): 100 mg via ORAL
  Filled 2020-01-22 (×5): qty 1

## 2020-01-22 MED ORDER — LORAZEPAM 1 MG PO TABS: 1.0000 mg | ORAL_TABLET | ORAL | Status: AC | PRN

## 2020-01-22 MED ORDER — PANTOPRAZOLE SODIUM 40 MG PO TBEC
40.0000 mg | DELAYED_RELEASE_TABLET | Freq: Every day | ORAL | Status: DC
  Administered 2020-01-22 – 2020-01-26 (×5): 40 mg via ORAL
  Filled 2020-01-22 (×5): qty 1

## 2020-01-22 MED ORDER — TRAZODONE HCL 50 MG PO TABS
100.0000 mg | ORAL_TABLET | Freq: Every day | ORAL | Status: DC
  Administered 2020-01-22 – 2020-01-25 (×4): 100 mg via ORAL
  Filled 2020-01-22 (×4): qty 2

## 2020-01-22 MED ORDER — ONDANSETRON HCL 4 MG/2ML IJ SOLN: 4.0000 mg | Freq: Four times a day (QID) | INTRAMUSCULAR | Status: DC | PRN

## 2020-01-22 MED ORDER — TRAZODONE HCL 50 MG PO TABS: 50.0000 mg | ORAL_TABLET | Freq: Every day | ORAL | Status: DC

## 2020-01-22 MED ORDER — DULOXETINE HCL 30 MG PO CPEP
30.0000 mg | ORAL_CAPSULE | Freq: Every day | ORAL | Status: DC
  Administered 2020-01-22 – 2020-01-26 (×6): 30 mg via ORAL
  Filled 2020-01-22 (×5): qty 1

## 2020-01-22 MED ORDER — LORAZEPAM 2 MG PO TABS: 0.0000 mg | ORAL_TABLET | Freq: Four times a day (QID) | ORAL | Status: AC

## 2020-01-22 MED ORDER — FENTANYL CITRATE (PF) 100 MCG/2ML IJ SOLN
25.0000 ug | Freq: Once | INTRAMUSCULAR | Status: AC
  Administered 2020-01-22: 25 ug via INTRAVENOUS
  Filled 2020-01-22: qty 2

## 2020-01-22 MED ORDER — ONDANSETRON HCL 4 MG/2ML IJ SOLN
4.0000 mg | Freq: Once | INTRAMUSCULAR | Status: AC
  Administered 2020-01-22: 4 mg via INTRAVENOUS
  Filled 2020-01-22: qty 2

## 2020-01-22 MED ORDER — HYDROCHLOROTHIAZIDE 12.5 MG PO CAPS
12.5000 mg | ORAL_CAPSULE | Freq: Every day | ORAL | Status: DC
  Administered 2020-01-22 – 2020-01-25 (×4): 12.5 mg via ORAL
  Filled 2020-01-22 (×4): qty 1

## 2020-01-22 MED ORDER — CYCLOBENZAPRINE HCL 10 MG PO TABS
10.0000 mg | ORAL_TABLET | Freq: Three times a day (TID) | ORAL | Status: DC | PRN
  Administered 2020-01-24: 21:00:00 10 mg via ORAL
  Filled 2020-01-22: qty 1

## 2020-01-22 MED ORDER — ADULT MULTIVITAMIN W/MINERALS CH
1.0000 | ORAL_TABLET | Freq: Every day | ORAL | Status: DC
  Administered 2020-01-22 – 2020-01-26 (×5): 1 via ORAL
  Filled 2020-01-22 (×5): qty 1

## 2020-01-22 MED ORDER — FOLIC ACID 1 MG PO TABS
1.0000 mg | ORAL_TABLET | Freq: Every day | ORAL | Status: DC
  Administered 2020-01-22 – 2020-01-26 (×5): 1 mg via ORAL
  Filled 2020-01-22 (×5): qty 1

## 2020-01-22 MED ORDER — SODIUM CHLORIDE 0.9 % IV SOLN: INTRAVENOUS | Status: DC

## 2020-01-22 MED ORDER — THIAMINE HCL 100 MG/ML IJ SOLN
100.0000 mg | Freq: Every day | INTRAMUSCULAR | Status: DC
  Administered 2020-01-26: 100 mg via INTRAVENOUS
  Filled 2020-01-22: qty 2

## 2020-01-22 MED ORDER — SODIUM CHLORIDE 0.9 % IV SOLN: 1.0000 g | Freq: Once | INTRAVENOUS | Status: DC

## 2020-01-22 MED ORDER — SODIUM CHLORIDE 0.9% FLUSH
10.0000 mL | Freq: Two times a day (BID) | INTRAVENOUS | Status: DC
  Administered 2020-01-22 – 2020-01-26 (×6): 10 mL

## 2020-01-22 MED ORDER — FERROUS SULFATE 325 (65 FE) MG PO TABS
325.0000 mg | ORAL_TABLET | Freq: Two times a day (BID) | ORAL | Status: DC
  Administered 2020-01-22 – 2020-01-23 (×3): 325 mg via ORAL
  Filled 2020-01-22 (×4): qty 1

## 2020-01-22 MED ORDER — ACETAMINOPHEN 650 MG RE SUPP: 650.0000 mg | Freq: Four times a day (QID) | RECTAL | Status: DC | PRN

## 2020-01-22 MED ORDER — FENTANYL CITRATE (PF) 100 MCG/2ML IJ SOLN: 25.0000 ug | Freq: Once | INTRAMUSCULAR | Status: DC

## 2020-01-22 MED ORDER — ONDANSETRON HCL 4 MG PO TABS: 4.0000 mg | ORAL_TABLET | Freq: Four times a day (QID) | ORAL | Status: DC | PRN

## 2020-01-22 MED ORDER — ACETAMINOPHEN 325 MG PO TABS: 650.0000 mg | ORAL_TABLET | Freq: Four times a day (QID) | ORAL | Status: DC | PRN

## 2020-01-22 MED ORDER — IOHEXOL 300 MG/ML  SOLN
100.0000 mL | Freq: Once | INTRAMUSCULAR | Status: AC | PRN
  Administered 2020-01-22: 100 mL via INTRAVENOUS

## 2020-01-22 MED ORDER — LORAZEPAM 2 MG PO TABS: 0.0000 mg | ORAL_TABLET | Freq: Two times a day (BID) | ORAL | Status: AC

## 2020-01-22 MED ORDER — TRAMADOL HCL 50 MG PO TABS
50.0000 mg | ORAL_TABLET | Freq: Four times a day (QID) | ORAL | Status: DC | PRN
  Administered 2020-01-22 – 2020-01-25 (×7): 50 mg via ORAL
  Filled 2020-01-22 (×7): qty 1

## 2020-01-22 MED ORDER — ENOXAPARIN SODIUM 40 MG/0.4ML ~~LOC~~ SOLN
40.0000 mg | SUBCUTANEOUS | Status: DC
  Administered 2020-01-22: 22:00:00 40 mg via SUBCUTANEOUS
  Filled 2020-01-22: qty 0.4

## 2020-01-22 MED ORDER — NORTRIPTYLINE HCL 10 MG PO CAPS
20.0000 mg | ORAL_CAPSULE | Freq: Every day | ORAL | Status: DC
  Administered 2020-01-22 – 2020-01-25 (×4): 20 mg via ORAL
  Filled 2020-01-22 (×6): qty 2

## 2020-01-22 MED ORDER — AMLODIPINE BESYLATE 5 MG PO TABS
5.0000 mg | ORAL_TABLET | Freq: Every day | ORAL | Status: DC
  Administered 2020-01-22 – 2020-01-25 (×4): 5 mg via ORAL
  Filled 2020-01-22 (×4): qty 1

## 2020-01-22 MED ORDER — SODIUM CHLORIDE 0.9% FLUSH
10.0000 mL | INTRAVENOUS | Status: DC | PRN
  Administered 2020-01-24: 21:00:00 10 mL
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
## 2020-01-22 NOTE — ED Notes (Signed)
Sherrie, rn and this rn attempt iv insertion x2 without success. Will consult iv team.

## 2020-01-22 NOTE — ED Notes (Addendum)
Attempt to obtain IV unsuccessful.

## 2020-01-22 NOTE — ED Notes (Signed)
Went in to room immediately after iv team left room. Pt's iv was out, dressing and tegaderm hanging off arm. Attempted to flush line back in place. Will consult iv team again.

## 2020-01-22 NOTE — ED Notes (Signed)
Patient updated on wait time. Patient verbalizes understanding.  

## 2020-01-22 NOTE — ED Notes (Signed)
Patient transported to CT 

## 2020-01-22 NOTE — ED Notes (Signed)
Pt still in ct.  
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
## 2020-01-22 NOTE — ED Notes (Signed)
Report to jennifer, rn.  

## 2020-01-22 NOTE — ED Notes (Addendum)
PT smells of urine and feces. Pt has 3 diapers on and all are soaked with urine.Pt skirt is soaked with urine and feces. Pt asked that this RN to throw it away. Pt has moderate amount of soft brown stool present. Pt states she cant tell when she has to use the bathroom. Pt cleaned and new diaper applied.

## 2020-01-22 NOTE — ED Notes (Signed)
Pt has urinatedi n bed. Pt changed of dirty brief, wet sheets.

## 2020-01-22 NOTE — ED Notes (Signed)
Waiting on iv team.  

## 2020-01-22 NOTE — ED Notes (Signed)
Iv team here 

## 2020-01-23 ENCOUNTER — Observation Stay: Payer: Medicare Other

## 2020-01-23 DIAGNOSIS — F1010 Alcohol abuse, uncomplicated: Secondary | ICD-10-CM | POA: Diagnosis present

## 2020-01-23 DIAGNOSIS — H5462 Unqualified visual loss, left eye, normal vision right eye: Secondary | ICD-10-CM | POA: Diagnosis present

## 2020-01-23 DIAGNOSIS — N3001 Acute cystitis with hematuria: Secondary | ICD-10-CM | POA: Diagnosis present

## 2020-01-23 DIAGNOSIS — B1920 Unspecified viral hepatitis C without hepatic coma: Secondary | ICD-10-CM | POA: Diagnosis present

## 2020-01-23 DIAGNOSIS — N838 Other noninflammatory disorders of ovary, fallopian tube and broad ligament: Secondary | ICD-10-CM | POA: Diagnosis not present

## 2020-01-23 DIAGNOSIS — R410 Disorientation, unspecified: Secondary | ICD-10-CM | POA: Diagnosis present

## 2020-01-23 DIAGNOSIS — I10 Essential (primary) hypertension: Secondary | ICD-10-CM | POA: Diagnosis present

## 2020-01-23 DIAGNOSIS — G40909 Epilepsy, unspecified, not intractable, without status epilepticus: Secondary | ICD-10-CM | POA: Diagnosis present

## 2020-01-23 DIAGNOSIS — K219 Gastro-esophageal reflux disease without esophagitis: Secondary | ICD-10-CM | POA: Diagnosis present

## 2020-01-23 DIAGNOSIS — Z9049 Acquired absence of other specified parts of digestive tract: Secondary | ICD-10-CM | POA: Diagnosis not present

## 2020-01-23 DIAGNOSIS — Z20822 Contact with and (suspected) exposure to covid-19: Secondary | ICD-10-CM | POA: Diagnosis present

## 2020-01-23 DIAGNOSIS — D519 Vitamin B12 deficiency anemia, unspecified: Secondary | ICD-10-CM

## 2020-01-23 DIAGNOSIS — G4700 Insomnia, unspecified: Secondary | ICD-10-CM | POA: Diagnosis present

## 2020-01-23 DIAGNOSIS — G9341 Metabolic encephalopathy: Secondary | ICD-10-CM | POA: Diagnosis present

## 2020-01-23 DIAGNOSIS — M7989 Other specified soft tissue disorders: Secondary | ICD-10-CM | POA: Diagnosis present

## 2020-01-23 DIAGNOSIS — Z79899 Other long term (current) drug therapy: Secondary | ICD-10-CM | POA: Diagnosis not present

## 2020-01-23 DIAGNOSIS — F17210 Nicotine dependence, cigarettes, uncomplicated: Secondary | ICD-10-CM | POA: Diagnosis present

## 2020-01-23 DIAGNOSIS — N83202 Unspecified ovarian cyst, left side: Secondary | ICD-10-CM | POA: Diagnosis present

## 2020-01-23 DIAGNOSIS — F329 Major depressive disorder, single episode, unspecified: Secondary | ICD-10-CM | POA: Diagnosis present

## 2020-01-23 DIAGNOSIS — G8324 Monoplegia of upper limb affecting left nondominant side: Secondary | ICD-10-CM | POA: Diagnosis present

## 2020-01-23 DIAGNOSIS — N83201 Unspecified ovarian cyst, right side: Secondary | ICD-10-CM | POA: Diagnosis present

## 2020-01-23 DIAGNOSIS — W3400XS Accidental discharge from unspecified firearms or gun, sequela: Secondary | ICD-10-CM | POA: Diagnosis not present

## 2020-01-23 DIAGNOSIS — N390 Urinary tract infection, site not specified: Secondary | ICD-10-CM | POA: Diagnosis not present

## 2020-01-23 DIAGNOSIS — M79605 Pain in left leg: Secondary | ICD-10-CM | POA: Diagnosis present

## 2020-01-23 DIAGNOSIS — F419 Anxiety disorder, unspecified: Secondary | ICD-10-CM | POA: Diagnosis present

## 2020-01-23 DIAGNOSIS — D509 Iron deficiency anemia, unspecified: Secondary | ICD-10-CM | POA: Diagnosis present

## 2020-01-23 DIAGNOSIS — K5900 Constipation, unspecified: Secondary | ICD-10-CM | POA: Diagnosis present

## 2020-01-23 DIAGNOSIS — D500 Iron deficiency anemia secondary to blood loss (chronic): Secondary | ICD-10-CM

## 2020-01-23 LAB — COMPREHENSIVE METABOLIC PANEL
ALT: 9 U/L (ref 0–44)
AST: 12 U/L — ABNORMAL LOW (ref 15–41)
Albumin: 3 g/dL — ABNORMAL LOW (ref 3.5–5.0)
Alkaline Phosphatase: 68 U/L (ref 38–126)
Anion gap: 9 (ref 5–15)
BUN: 10 mg/dL (ref 6–20)
CO2: 26 mmol/L (ref 22–32)
Calcium: 8.4 mg/dL — ABNORMAL LOW (ref 8.9–10.3)
Chloride: 99 mmol/L (ref 98–111)
Creatinine, Ser: 0.85 mg/dL (ref 0.44–1.00)
GFR calc Af Amer: >60 mL/min (ref >60)
GFR calc non Af Amer: >60 mL/min (ref >60)
Glucose, Bld: 90 mg/dL (ref 70–99)
Potassium: 3.7 mmol/L (ref 3.5–5.1)
Sodium: 134 mmol/L — ABNORMAL LOW (ref 135–145)
Total Bilirubin: 0.6 mg/dL (ref 0.3–1.2)
Total Protein: 7.7 g/dL (ref 6.5–8.1)

## 2020-01-23 LAB — FERRITIN: Ferritin: 22 ng/mL (ref 11–307)

## 2020-01-23 LAB — CBC
HCT: 26.3 % — ABNORMAL LOW (ref 36.0–46.0)
Hemoglobin: 7.5 g/dL — ABNORMAL LOW (ref 12.0–15.0)
MCH: 17 pg — ABNORMAL LOW (ref 26.0–34.0)
MCHC: 28.5 g/dL — ABNORMAL LOW (ref 30.0–36.0)
MCV: 59.8 fL — ABNORMAL LOW (ref 80.0–100.0)
Platelets: 459 10*3/uL — ABNORMAL HIGH (ref 150–400)
RBC: 4.4 MIL/uL (ref 3.87–5.11)
RDW: 23.5 % — ABNORMAL HIGH (ref 11.5–15.5)
WBC: 8.4 10*3/uL (ref 4.0–10.5)
nRBC: 0 % (ref 0.0–0.2)

## 2020-01-23 LAB — IRON AND TIBC
Iron: 90 ug/dL (ref 28–170)
Saturation Ratios: 23 % (ref 10.4–31.8)
TIBC: 385 ug/dL (ref 250–450)
UIBC: 295 ug/dL

## 2020-01-23 LAB — MAGNESIUM: Magnesium: 1.8 mg/dL (ref 1.7–2.4)

## 2020-01-23 LAB — ETHANOL: Alcohol, Ethyl (B): <10 mg/dL (ref <10)

## 2020-01-23 LAB — PHOSPHORUS: Phosphorus: 3.9 mg/dL (ref 2.5–4.6)

## 2020-01-23 LAB — HIV ANTIBODY (ROUTINE TESTING W REFLEX): HIV Screen 4th Generation wRfx: NONREACTIVE

## 2020-01-23 LAB — VITAMIN B12: Vitamin B-12: 140 pg/mL — ABNORMAL LOW (ref 180–914)

## 2020-01-23 MED ORDER — FERROUS SULFATE 325 (65 FE) MG PO TABS
325.0000 mg | ORAL_TABLET | Freq: Three times a day (TID) | ORAL | Status: DC
  Administered 2020-01-23 – 2020-01-26 (×10): 325 mg via ORAL
  Filled 2020-01-23 (×10): qty 1

## 2020-01-23 MED ORDER — SODIUM CHLORIDE 0.9 % IV SOLN
1.0000 g | INTRAVENOUS | Status: AC
  Administered 2020-01-23 – 2020-01-24 (×2): 1 g via INTRAVENOUS
  Filled 2020-01-23 (×2): qty 1

## 2020-01-23 MED ORDER — CYANOCOBALAMIN 1000 MCG/ML IJ SOLN
1000.0000 ug | Freq: Every day | INTRAMUSCULAR | Status: DC
  Administered 2020-01-23 – 2020-01-25 (×3): 1000 ug via INTRAMUSCULAR
  Filled 2020-01-23 (×4): qty 1

## 2020-01-23 NOTE — Progress Notes (Signed)
PT Cancellation Note  Patient Details Name: Victoria Gross MRN: [id number] DOB: [birth year]   Cancelled Treatment:    Reason Eval/Treat Not Completed: Patient at procedure or test/unavailable. Will re-attempt later as time allows.   Aisia Correira 01/23/2020, 2:11 PM

## 2020-01-23 NOTE — TOC Initial Note (Signed)
Transition of Care [facility]) - Initial/Assessment Note    Patient Details  Name: Victoria Gross MRN: [id number] Date of Birth: July 12, 1971  [facility] CM/SW Contact:    Trenton Founds, RN Phone Number: 01/23/2020, 11:11 AM  Clinical Narrative:   RNCM assessed patient at bedside. Patient sitting up with tray in front of her but asleep, did arouse to gentle verbal stimulation. Patient reports to feeling ok today. Discussed with patient reason for CM visit was about Observation note as well as to provide her with Substance Abuse resources. Patient verbalized understanding of Obs notice and was agreeable to be provided with SA resources.                   Patient Goals and CMS Choice        Expected Discharge Plan and Services                                                Prior Living Arrangements/Services                       Activities of Daily Living Home Assistive Devices/Equipment: None ADL Screening (condition at time of admission) Patient's cognitive ability adequate to safely complete daily activities?: Yes Is the patient deaf or have difficulty hearing?: No Does the patient have difficulty seeing, even when wearing glasses/contacts?: No Does the patient have difficulty concentrating, remembering, or making decisions?: No Patient able to express need for assistance with ADLs?: No Does the patient have difficulty dressing or bathing?: No Independently performs ADLs?: No Does the patient have difficulty walking or climbing stairs?: No Weakness of Legs: Both Weakness of Arms/Hands: Both  Permission Sought/Granted                  Emotional Assessment              Admission diagnosis:  Confusion [R41.0] UTI (urinary tract infection) [N39.0] Cystitis [N30.90] Patient Active Problem List   Diagnosis Date Noted  . Acute lower UTI 01/22/2020  . Obesity, Class III, BMI 40-49.9 (morbid obesity) (HCC) 01/22/2020  . UTI (urinary  tract infection) 01/22/2020  . Lymphedema 11/02/2019  . GERD (gastroesophageal reflux disease) 11/02/2019  . Essential hypertension 11/02/2019  . Cellulitis 04/01/2019  . Seizure disorder (HCC)   . Prosthetic eye globe   . Anxiety   . ETOH abuse   . Elevated liver enzymes   . Hepatitis C 10/18/2013  . Weakness of left side of body 07/27/1997  . Blindness of left eye 07/27/1997   PCP:  [facility]:   [facility] - [place], [place] - [address] [place] [address] Phone: [phone] Fax: [phone]  [facility] [address] [place], [place] - [address] [place] [address] Phone: [phone] Fax: [phone]     Social Determinants of Health (SDOH) Interventions    Readmission Risk Interventions No flowsheet data found.

## 2020-01-23 NOTE — Care Management Obs Status (Signed)
MEDICARE OBSERVATION STATUS NOTIFICATION   Patient Details  Name: Victoria Gross MRN: [id number] Date of Birth: September 03, 1970   Medicare Observation Status Notification Given:  Evlyn Kanner, RN 01/23/2020, 11:10 AM
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
## 2020-01-24 ENCOUNTER — Inpatient Hospital Stay: Payer: Medicare Other

## 2020-01-24 DIAGNOSIS — N838 Other noninflammatory disorders of ovary, fallopian tube and broad ligament: Secondary | ICD-10-CM

## 2020-01-24 DIAGNOSIS — N390 Urinary tract infection, site not specified: Secondary | ICD-10-CM

## 2020-01-24 DIAGNOSIS — D500 Iron deficiency anemia secondary to blood loss (chronic): Secondary | ICD-10-CM

## 2020-01-24 LAB — URINE CULTURE: Culture: >=100000 — AB

## 2020-01-24 MED ORDER — DICYCLOMINE HCL 10 MG PO CAPS
10.0000 mg | ORAL_CAPSULE | Freq: Three times a day (TID) | ORAL | Status: DC
  Administered 2020-01-25 – 2020-01-26 (×6): 10 mg via ORAL
  Filled 2020-01-24 (×7): qty 1

## 2020-01-24 MED ORDER — IPRATROPIUM-ALBUTEROL 0.5-2.5 (3) MG/3ML IN SOLN: 3.0000 mL | Freq: Four times a day (QID) | RESPIRATORY_TRACT | Status: DC | PRN

## 2020-01-24 MED ORDER — CEPHALEXIN 500 MG PO CAPS
500.0000 mg | ORAL_CAPSULE | Freq: Three times a day (TID) | ORAL | Status: DC
  Administered 2020-01-25 – 2020-01-26 (×5): 500 mg via ORAL
  Filled 2020-01-24 (×5): qty 1
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
## 2020-01-24 NOTE — TOC Progression Note (Signed)
Transition of Care [facility]) - Progression Note    Patient Details  Name: Victoria Gross MRN: [id number] Date of Birth: 08-Dec-1970  [facility] CM/SW Contact  Trenton Founds, RN Phone Number: 01/24/2020, 3:27 PM  Clinical Narrative:   RNCM started insurance authorization with [facility]. Ref# [id number] and clinicals were faxed to [phone].          Expected Discharge Plan and Services                                                 Social Determinants of Health (SDOH) Interventions    Readmission Risk Interventions No flowsheet data found.

## 2020-01-24 NOTE — NC FL2 (Signed)
[place] [address] MEDICAID FL2 LEVEL OF CARE SCREENING TOOL     IDENTIFICATION  Patient Name: Victoria Gross Birthdate: 07/12/1971 Sex: female Admission Date (Current Location): 01/22/2020  [facility] and [place] Number:  Chiropodist and Address:  [facility], [address], [place], [place] [address]      Provider Number: [id number]  Attending Physician Name and Address:  Pennie Banter, DO  Relative Name and Phone Number:  Melburn Popper [phone]    Current Level of Care: Hospital Recommended Level of Care: Skilled Nursing Facility Prior Approval Number:    Date Approved/Denied:   PASRR Number:    Discharge Plan: SNF    Current Diagnoses: Patient Active Problem List   Diagnosis Date Noted   Iron deficiency anemia 01/23/2020   Iron deficiency anemia due to chronic blood loss    Anemia due to vitamin B12 deficiency    Acute metabolic encephalopathy    Ovarian mass    Acute lower UTI 01/22/2020   Obesity, Class III, BMI 40-49.9 (morbid obesity) (HCC) 01/22/2020   UTI (urinary tract infection) 01/22/2020   Lymphedema 11/02/2019   GERD (gastroesophageal reflux disease) 11/02/2019   Essential hypertension 11/02/2019   Cellulitis 04/01/2019   Seizure disorder (HCC)    Prosthetic eye globe    Anxiety    ETOH abuse    Elevated liver enzymes    Hepatitis C 10/18/2013   Weakness of left side of body 07/27/1997   Blindness of left eye 07/27/1997    Orientation RESPIRATION BLADDER Height & Weight     Self, Time, Situation, Place  Normal External catheter Weight: 90.7 kg Height:  5' (152.4 cm)  BEHAVIORAL SYMPTOMS/MOOD NEUROLOGICAL BOWEL NUTRITION STATUS    Convulsions/Seizures Continent Diet (2 gram Sodium, thin liquids)  AMBULATORY STATUS COMMUNICATION OF NEEDS Skin   Extensive Assist Verbally Normal                       Personal Care Assistance Level of Assistance  Bathing, Feeding, Dressing Bathing  Assistance: Maximum assistance Feeding assistance: Independent Dressing Assistance: Maximum assistance     Functional Limitations Info  Sight, Hearing, Speech Sight Info: Adequate Hearing Info: Adequate Speech Info: Impaired (Difficult to understand at times.)    SPECIAL CARE FACTORS FREQUENCY  PT (By licensed PT), OT (By licensed OT)                    Contractures Contractures Info: Not present    Additional Factors Info  Code Status, Allergies Code Status Info: Full Allergies Info: Aspirin, Prozac, Tomato           Current Medications (01/24/2020):  This is the current hospital active medication list Current Facility-Administered Medications  Medication Dose Route Frequency Provider Last Rate Last Admin   acetaminophen (TYLENOL) tablet 650 mg  650 mg Oral Q6H PRN Agbata, Tochukwu, MD       Or   acetaminophen (TYLENOL) suppository 650 mg  650 mg Rectal Q6H PRN Agbata, Tochukwu, MD       amLODipine (NORVASC) tablet 5 mg  5 mg Oral Daily Agbata, Tochukwu, MD   5 mg at 01/24/20 0818   [START ON 01/25/2020] cephALEXin (KEFLEX) capsule 500 mg  500 mg Oral Q8H Griffith, Kelly A, DO       cyanocobalamin ((VITAMIN B-12)) injection 1,000 mcg  1,000 mcg Intramuscular Daily Alford Highland, MD   1,000 mcg at 01/24/20 0820   cyclobenzaprine (FLEXERIL) tablet 10 mg  10 mg Oral TID PRN Agbata, Tochukwu, MD       DULoxetine (CYMBALTA) DR capsule 30 mg  30 mg Oral Daily Agbata, Tochukwu, MD   30 mg at 01/24/20 0819   fentaNYL (SUBLIMAZE) injection 25 mcg  25 mcg Intravenous Once Concha Se, MD       ferrous sulfate tablet 325 mg  325 mg Oral TID WC Alford Highland, MD   325 mg at 01/24/20 1158   fluticasone (FLONASE) 50 MCG/ACT nasal spray 1 spray  1 spray Each Nare Daily PRN Agbata, Tochukwu, MD       folic acid (FOLVITE) tablet 1 mg  1 mg Oral Daily Agbata, Tochukwu, MD   1 mg at 01/24/20 6734   hydrochlorothiazide (MICROZIDE) capsule 12.5 mg  12.5 mg Oral Daily  Agbata, Tochukwu, MD   12.5 mg at 01/24/20 0819   LORazepam (ATIVAN) tablet 1-4 mg  1-4 mg Oral Q1H PRN Agbata, Tochukwu, MD       Or   LORazepam (ATIVAN) injection 1-4 mg  1-4 mg Intravenous Q1H PRN Agbata, Tochukwu, MD       LORazepam (ATIVAN) tablet 0-4 mg  0-4 mg Oral Q12H Agbata, Tochukwu, MD       multivitamin with minerals tablet 1 tablet  1 tablet Oral Daily Agbata, Tochukwu, MD   1 tablet at 01/24/20 0816   nortriptyline (PAMELOR) capsule 20 mg  20 mg Oral QHS Agbata, Tochukwu, MD   20 mg at 01/23/20 2140   ondansetron (ZOFRAN) tablet 4 mg  4 mg Oral Q6H PRN Agbata, Tochukwu, MD       Or   ondansetron (ZOFRAN) injection 4 mg  4 mg Intravenous Q6H PRN Agbata, Tochukwu, MD       pantoprazole (PROTONIX) EC tablet 40 mg  40 mg Oral Daily Agbata, Tochukwu, MD   40 mg at 01/24/20 0819   sodium chloride flush (NS) 0.9 % injection 10-40 mL  10-40 mL Intracatheter Q12H Agbata, Tochukwu, MD   10 mL at 01/24/20 1937   sodium chloride flush (NS) 0.9 % injection 10-40 mL  10-40 mL Intracatheter PRN Agbata, Tochukwu, MD       thiamine tablet 100 mg  100 mg Oral Daily Agbata, Tochukwu, MD   100 mg at 01/24/20 9024   Or   thiamine (B-1) injection 100 mg  100 mg Intravenous Daily Agbata, Tochukwu, MD       traMADol (ULTRAM) tablet 50 mg  50 mg Oral Q6H PRN Agbata, Tochukwu, MD   50 mg at 01/24/20 0817   traZODone (DESYREL) tablet 100 mg  100 mg Oral QHS Agbata, Tochukwu, MD   100 mg at 01/23/20 2140     Discharge Medications: Please see discharge summary for a list of discharge medications.  Relevant Imaging Results:  Relevant Lab Results:   Additional Information SS# [id number]  Trenton Founds, RN
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
## 2020-01-24 NOTE — TOC Progression Note (Signed)
Transition of Care [facility]) - Progression Note    Patient Details  Name: Victoria Gross MRN: [id number] Date of Birth: Jul 14, 1971  Transition of Care [facility]) CM/SW Contact  Shelbie Ammons, RN Phone Number: 01/24/2020, 12:42 PM  Clinical Narrative:   RNCM met with patient at bedside to discuss therapy recommendations that go to short term rehab when leaving the hospital. Patient is much more alert today than during yesterday's assessment. Patient reports that normally she is able to take care of herself and does think it would be advisable for her to go only for short term but she is adamant she does not want long term placement. Patient is agreeable to this CM starting bed search and is agreeable to bed search to all facilities in the area. Informed patient that this CM would follow up as soon as bed options were available to present.  RNCM verified PASSR, completed FL-2 and started bed search.          Expected Discharge Plan and Services                                                 Social Determinants of Health (SDOH) Interventions    Readmission Risk Interventions No flowsheet data found.

## 2020-01-25 LAB — CBC WITH DIFFERENTIAL/PLATELET
Abs Immature Granulocytes: 0.04 10*3/uL (ref 0.00–0.07)
Basophils Absolute: 0 10*3/uL (ref 0.0–0.1)
Basophils Relative: 0 %
Eosinophils Absolute: 0.3 10*3/uL (ref 0.0–0.5)
Eosinophils Relative: 3 %
HCT: 30.8 % — ABNORMAL LOW (ref 36.0–46.0)
Hemoglobin: 9 g/dL — ABNORMAL LOW (ref 12.0–15.0)
Immature Granulocytes: 0 %
Lymphocytes Relative: 20 %
Lymphs Abs: 2 10*3/uL (ref 0.7–4.0)
MCH: 17.1 pg — ABNORMAL LOW (ref 26.0–34.0)
MCHC: 29.2 g/dL — ABNORMAL LOW (ref 30.0–36.0)
MCV: 58.4 fL — ABNORMAL LOW (ref 80.0–100.0)
Monocytes Absolute: 1.2 10*3/uL — ABNORMAL HIGH (ref 0.1–1.0)
Monocytes Relative: 12 %
Neutro Abs: 6.3 10*3/uL (ref 1.7–7.7)
Neutrophils Relative %: 65 %
Platelets: 516 10*3/uL — ABNORMAL HIGH (ref 150–400)
RBC: 5.27 MIL/uL — ABNORMAL HIGH (ref 3.87–5.11)
RDW: 24.7 % — ABNORMAL HIGH (ref 11.5–15.5)
WBC: 9.8 10*3/uL (ref 4.0–10.5)
nRBC: 0 % (ref 0.0–0.2)

## 2020-01-25 LAB — BASIC METABOLIC PANEL
Anion gap: 10 (ref 5–15)
BUN: 9 mg/dL (ref 6–20)
CO2: 27 mmol/L (ref 22–32)
Calcium: 8.9 mg/dL (ref 8.9–10.3)
Chloride: 97 mmol/L — ABNORMAL LOW (ref 98–111)
Creatinine, Ser: 0.92 mg/dL (ref 0.44–1.00)
GFR calc Af Amer: >60 mL/min (ref >60)
GFR calc non Af Amer: >60 mL/min (ref >60)
Glucose, Bld: 85 mg/dL (ref 70–99)
Potassium: 3.7 mmol/L (ref 3.5–5.1)
Sodium: 134 mmol/L — ABNORMAL LOW (ref 135–145)

## 2020-01-25 LAB — MAGNESIUM: Magnesium: 1.9 mg/dL (ref 1.7–2.4)

## 2020-01-25 LAB — SARS CORONAVIRUS 2 BY RT PCR (HOSPITAL ORDER, PERFORMED IN ~~LOC~~ HOSPITAL LAB): SARS Coronavirus 2: NEGATIVE

## 2020-01-25 MED ORDER — NEOMYCIN-POLYMYXIN-DEXAMETH 3.5-10000-0.1 OP SUSP
2.0000 [drp] | Freq: Two times a day (BID) | OPHTHALMIC | Status: DC | PRN
  Administered 2020-01-25: 2 [drp] via OPHTHALMIC
  Filled 2020-01-25: qty 5

## 2020-01-25 MED ORDER — POLYETHYLENE GLYCOL 3350 17 G PO PACK
17.0000 g | PACK | Freq: Every day | ORAL | Status: DC
  Administered 2020-01-25 – 2020-01-26 (×2): 17 g via ORAL
  Filled 2020-01-25 (×2): qty 1

## 2020-01-25 MED ORDER — BISACODYL 10 MG RE SUPP
10.0000 mg | Freq: Every day | RECTAL | Status: DC | PRN
  Filled 2020-01-25: qty 1

## 2020-01-25 MED ORDER — BISACODYL 5 MG PO TBEC
5.0000 mg | DELAYED_RELEASE_TABLET | Freq: Every day | ORAL | Status: DC | PRN
  Administered 2020-01-25: 12:00:00 5 mg via ORAL
  Filled 2020-01-25: qty 1

## 2020-01-25 NOTE — TOC Progression Note (Signed)
Transition of Care [facility]) - Progression Note    Patient Details  Name: Victoria Gross MRN: [id number] Date of Birth: 08-Aug-1970  [facility]) CM/SW Contact  Trenton Founds, RN Phone Number: 01/25/2020, 10:46 AM  Clinical Narrative:   RNCM faxed updated clinicals to [facility] per request of Darl Pikes. Patient chooses bed at [facility] and informed Tresa Endo of same.          Expected Discharge Plan and Services                                                 Social Determinants of Health (SDOH) Interventions    Readmission Risk Interventions No flowsheet data found.
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
## 2020-01-26 DIAGNOSIS — D519 Vitamin B12 deficiency anemia, unspecified: Secondary | ICD-10-CM

## 2020-01-26 DIAGNOSIS — I10 Essential (primary) hypertension: Secondary | ICD-10-CM

## 2020-01-26 MED ORDER — DICYCLOMINE HCL 10 MG PO CAPS: 10.0000 mg | ORAL_CAPSULE | Freq: Three times a day (TID) | ORAL | Status: DC

## 2020-01-26 MED ORDER — ADULT MULTIVITAMIN W/MINERALS CH: 1.0000 | ORAL_TABLET | Freq: Every day | ORAL | Status: DC

## 2020-01-26 MED ORDER — VITAMIN B-12 1000 MCG PO TABS
1000.0000 ug | ORAL_TABLET | Freq: Every day | ORAL | Status: DC
Start: 2020-01-26 — End: 2020-07-15

## 2020-01-26 MED ORDER — NEOMYCIN-POLYMYXIN-DEXAMETH 3.5-10000-0.1 OP SUSP: 2.0000 [drp] | Freq: Two times a day (BID) | OPHTHALMIC | Status: DC | PRN

## 2020-01-26 MED ORDER — MINERAL OIL RE ENEM: 1.0000 | ENEMA | Freq: Once | RECTAL | Status: DC

## 2020-01-26 MED ORDER — ALBUTEROL SULFATE HFA 108 (90 BASE) MCG/ACT IN AERS
2.0000 | INHALATION_SPRAY | Freq: Four times a day (QID) | RESPIRATORY_TRACT | Status: DC | PRN
Start: 2020-01-26 — End: 2020-03-16

## 2020-01-26 MED ORDER — NORTRIPTYLINE HCL 10 MG PO CAPS: 20.0000 mg | ORAL_CAPSULE | Freq: Every day | ORAL | Status: DC

## 2020-01-26 MED ORDER — CEPHALEXIN 500 MG PO CAPS: 500.0000 mg | ORAL_CAPSULE | Freq: Three times a day (TID) | ORAL | 0 refills | Status: AC

## 2020-01-26 MED ORDER — THIAMINE HCL 100 MG PO TABS: 100.0000 mg | ORAL_TABLET | Freq: Every day | ORAL | Status: DC

## 2020-01-26 MED ORDER — POLYETHYLENE GLYCOL 3350 17 G PO PACK: 17.0000 g | PACK | Freq: Every day | ORAL | 0 refills | Status: DC

## 2020-01-26 MED ORDER — MAGNESIUM CITRATE PO SOLN
1.0000 | Freq: Once | ORAL | Status: AC
  Administered 2020-01-26: 11:00:00 1 via ORAL
  Filled 2020-01-26: qty 296

## 2020-01-26 MED ORDER — FOLIC ACID 1 MG PO TABS: 1.0000 mg | ORAL_TABLET | Freq: Every day | ORAL | Status: DC

## 2020-01-26 NOTE — Discharge Summary (Signed)
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 decisions.  A negative result may occur with improper specimen collection / handling, submission of specimen other than nasopharyngeal swab, presence of viral mutation(s) within the areas targeted  by this assay, and inadequate number of viral copies (<250 copies / mL). A negative result must be combined with clinical observations, patient history, and epidemiological information.  Fact Sheet for Patients:   BoilerBrush.com.cy  Fact Sheet for  Healthcare Providers: https://pope.com/  This test is not yet approved or  cleared by the [place] FDA and has been authorized for detection and/or diagnosis of SARS-CoV-2 by FDA under an Emergency Use Authorization (EUA).  This EUA will remain in effect (meaning this test can be used) for the duration of the COVID-19 declaration under Section 564(b)(1) of the Act, 21 U.S.C. section 360bbb-3(b)(1), unless the authorization is terminated or revoked sooner.  Performed at [facility], [address] [place], [place] [address]      Time coordinating discharge: Over 30 minutes  SIGNED:   Pennie Banter, DO [facility] 01/26/2020, 8:12 AM   If 7PM-7AM, please contact night-coverage [email or web address]

## 2020-01-26 NOTE — Care Management Important Message (Signed)
Important Message  Patient Details  Name: Victoria Gross MRN: [id number] Date of Birth: [birth year]   Medicare Important Message Given:  Yes     Olegario Messier A Burdette Gergely 01/26/2020, 10:43 AM

## 2020-01-26 NOTE — Progress Notes (Signed)
Called report to Anastasia Fiedler LPN at [facility] care patient is going to room 27B

## 2020-01-26 NOTE — TOC Transition Note (Signed)
Transition of Care [facility]) - CM/SW Discharge Note   Patient Details  Name: Victoria Gross MRN: [id number] Date of Birth: 1970/12/06  Transition of Care [facility]) CM/SW Contact:  Shelbie Ammons, RN Phone Number: 01/26/2020, 11:14 AM   Clinical Narrative:   RNCM met with patient in room to assist Ellsworth Lennox from [facility] to complete assessment. Patient is to discharge to [facility] today and EMS paperwork is completed. Per nurse patient needs to have BM prior to discharge. Information given to [place] at [facility] and patient will go to room 27B. RNCM will remain available for any needs.           Patient Goals and CMS Choice        Discharge Placement                       Discharge Plan and Services                                     Social Determinants of Health (SDOH) Interventions     Readmission Risk Interventions No flowsheet data found.

## 2020-02-15 ENCOUNTER — Other Ambulatory Visit: Payer: Self-pay

## 2020-02-15 ENCOUNTER — Encounter: Payer: Self-pay | Admitting: Emergency Medicine

## 2020-02-15 ENCOUNTER — Emergency Department: Payer: Medicare Other

## 2020-02-15 ENCOUNTER — Emergency Department
Admission: EM | Admit: 2020-02-15 | Discharge: 2020-02-15 | Disposition: A | Discharge status: Home / Self Care | Payer: Medicare Other | Attending: Emergency Medicine | Admitting: Emergency Medicine

## 2020-02-15 DIAGNOSIS — Z79899 Other long term (current) drug therapy: Secondary | ICD-10-CM | POA: Diagnosis not present

## 2020-02-15 DIAGNOSIS — F17210 Nicotine dependence, cigarettes, uncomplicated: Secondary | ICD-10-CM | POA: Diagnosis not present

## 2020-02-15 DIAGNOSIS — M549 Dorsalgia, unspecified: Secondary | ICD-10-CM

## 2020-02-15 DIAGNOSIS — I10 Essential (primary) hypertension: Secondary | ICD-10-CM | POA: Insufficient documentation

## 2020-02-15 DIAGNOSIS — M545 Low back pain: Secondary | ICD-10-CM | POA: Insufficient documentation

## 2020-02-15 DIAGNOSIS — R52 Pain, unspecified: Secondary | ICD-10-CM

## 2020-02-15 LAB — COMPREHENSIVE METABOLIC PANEL
ALT: 12 U/L (ref 0–44)
AST: 24 U/L (ref 15–41)
Albumin: 4 g/dL (ref 3.5–5.0)
Alkaline Phosphatase: 92 U/L (ref 38–126)
Anion gap: 10 (ref 5–15)
BUN: 24 mg/dL — ABNORMAL HIGH (ref 6–20)
CO2: 23 mmol/L (ref 22–32)
Calcium: 9.4 mg/dL (ref 8.9–10.3)
Chloride: 102 mmol/L (ref 98–111)
Creatinine, Ser: 0.94 mg/dL (ref 0.44–1.00)
GFR calc Af Amer: >60 mL/min (ref >60)
GFR calc non Af Amer: >60 mL/min (ref >60)
Glucose, Bld: 96 mg/dL (ref 70–99)
Potassium: 4.1 mmol/L (ref 3.5–5.1)
Sodium: 135 mmol/L (ref 135–145)
Total Bilirubin: 0.7 mg/dL (ref 0.3–1.2)
Total Protein: 9.4 g/dL — ABNORMAL HIGH (ref 6.5–8.1)

## 2020-02-15 LAB — CBC WITH DIFFERENTIAL/PLATELET
Abs Immature Granulocytes: 0.03 10*3/uL (ref 0.00–0.07)
Basophils Absolute: 0 10*3/uL (ref 0.0–0.1)
Basophils Relative: 1 %
Eosinophils Absolute: 0.2 10*3/uL (ref 0.0–0.5)
Eosinophils Relative: 3 %
HCT: 32.3 % — ABNORMAL LOW (ref 36.0–46.0)
Hemoglobin: 9.7 g/dL — ABNORMAL LOW (ref 12.0–15.0)
Immature Granulocytes: 0 %
Lymphocytes Relative: 26 %
Lymphs Abs: 2.2 10*3/uL (ref 0.7–4.0)
MCH: 18.4 pg — ABNORMAL LOW (ref 26.0–34.0)
MCHC: 30 g/dL (ref 30.0–36.0)
MCV: 61.3 fL — ABNORMAL LOW (ref 80.0–100.0)
Monocytes Absolute: 0.7 10*3/uL (ref 0.1–1.0)
Monocytes Relative: 8 %
Neutro Abs: 5.3 10*3/uL (ref 1.7–7.7)
Neutrophils Relative %: 62 %
Platelets: 394 10*3/uL (ref 150–400)
RBC: 5.27 MIL/uL — ABNORMAL HIGH (ref 3.87–5.11)
RDW: 27.1 % — ABNORMAL HIGH (ref 11.5–15.5)
Smear Review: NORMAL
WBC: 8.5 10*3/uL (ref 4.0–10.5)
nRBC: 0 % (ref 0.0–0.2)

## 2020-02-15 LAB — ETHANOL: Alcohol, Ethyl (B): <10 mg/dL (ref <10)

## 2020-02-15 LAB — URINALYSIS, COMPLETE (UACMP) WITH MICROSCOPIC
Bilirubin Urine: NEGATIVE
Glucose, UA: NEGATIVE mg/dL
Hgb urine dipstick: NEGATIVE
Ketones, ur: NEGATIVE mg/dL
Leukocytes,Ua: NEGATIVE
Nitrite: NEGATIVE
Protein, ur: NEGATIVE mg/dL
Specific Gravity, Urine: 1.006 (ref 1.005–1.030)
pH: 6 (ref 5.0–8.0)

## 2020-02-15 LAB — CK: Total CK: 69 U/L (ref 38–234)

## 2020-02-15 LAB — TROPONIN I (HIGH SENSITIVITY)
Troponin I (High Sensitivity): 4 ng/L (ref <18)
Troponin I (High Sensitivity): 3 ng/L (ref <18)

## 2020-02-15 MED ORDER — TRAMADOL HCL 50 MG PO TABS: 50.0000 mg | ORAL_TABLET | Freq: Four times a day (QID) | ORAL | 0 refills | Status: AC | PRN

## 2020-02-15 MED ORDER — SODIUM CHLORIDE 0.9 % IV BOLUS
500.0000 mL | Freq: Once | INTRAVENOUS | Status: AC
  Administered 2020-02-15: 500 mL via INTRAVENOUS

## 2020-02-15 MED ORDER — TRAMADOL HCL 50 MG PO TABS
50.0000 mg | ORAL_TABLET | Freq: Once | ORAL | Status: AC
  Administered 2020-02-15: 50 mg via ORAL
  Filled 2020-02-15: qty 1

## 2020-02-15 NOTE — ED Notes (Signed)
Pt st she does not have a way to get home. When she has to go to her PCP appointment she arranges transportation 2 days prior.  This RN contacted her caregiver Melburn Popper who st "I don't have a car. I can't get her".
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
## 2020-02-15 NOTE — Discharge Instructions (Signed)
Your back pain is likely caused by a muscle strain or spasm related to you having been in a chair for several days.  You may take the tramadol if needed for pain, although you should take over-the-counter Tylenol for less severe pain.  Return to the ER for new, worsening, or persistent severe back pain, weakness or numbness, fever, abdominal pain, vomiting, or any other new or worsening symptoms that concern you.

## 2020-02-15 NOTE — ED Notes (Signed)
Bedside cleaning performed by this RN and Ariel, RN upon arrival to ED. Pt placeed in clean gown at this time. Pt cleansed of stool incontinence. Clean, warm blankets provided to patient at this time. Lights dimmed for patient comfort at this time.

## 2020-02-15 NOTE — ED Notes (Signed)
This RN to bedside, attempted for IV x 2 without success. Blood work obtained by this Charity fundraiser and sent to lab. Pt noted to be increasingly somnolent from previous interaction. Pt repeatedly falling asleep while this RN attempting IV access. EDP at bedside at this time to assess patient.
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
## 2020-02-15 NOTE — ED Notes (Signed)
ED Provider Siadecki at bedside. Pt st "I'm ok going home".

## 2020-02-15 NOTE — ED Notes (Signed)
Pt provided with lunch and fluids by this RN

## 2020-02-15 NOTE — ED Notes (Signed)
Pt transported to Xray at this time.

## 2020-03-11 ENCOUNTER — Ambulatory Visit: Payer: Medicare Other | Attending: Internal Medicine

## 2020-03-11 DIAGNOSIS — Z23 Encounter for immunization: Secondary | ICD-10-CM

## 2020-03-11 NOTE — Progress Notes (Signed)
   Covid-19 Vaccination Clinic  Name:  Victoria Gross    MRN: [id number] DOB: 1971/06/19  03/11/2020  Victoria Gross was observed post Covid-19 immunization for 30 minutes based on pre-vaccination screening without incident. She was provided with Vaccine Information Sheet and instruction to access the V-Safe system.   Victoria Gross was instructed to call 911 with any severe reactions post vaccine: Marland Kitchen Difficulty breathing  . Swelling of face and throat  . A fast heartbeat  . A bad rash all over body  . Dizziness and weakness   Immunizations Administered    Name Date Dose VIS Date Route   Pfizer COVID-19 Vaccine 03/11/2020  3:15 PM 0.3 mL 09/20/2018 Intramuscular   Manufacturer: [employer], [employer]   Lot: [id number]   NDC: [address]-1
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
## 2020-03-16 ENCOUNTER — Emergency Department: Payer: Medicare Other

## 2020-03-16 ENCOUNTER — Inpatient Hospital Stay
Admission: EM | Admit: 2020-03-16 | Discharge: 2020-03-20 | DRG: 178 | Disposition: A | Discharge status: Home / Self Care | Payer: Medicare Other | Attending: Internal Medicine | Admitting: Internal Medicine

## 2020-03-16 ENCOUNTER — Other Ambulatory Visit: Payer: Self-pay

## 2020-03-16 DIAGNOSIS — D509 Iron deficiency anemia, unspecified: Secondary | ICD-10-CM | POA: Diagnosis present

## 2020-03-16 DIAGNOSIS — U071 COVID-19: Secondary | ICD-10-CM | POA: Diagnosis not present

## 2020-03-16 DIAGNOSIS — I890 Lymphedema, not elsewhere classified: Secondary | ICD-10-CM | POA: Diagnosis not present

## 2020-03-16 DIAGNOSIS — G40909 Epilepsy, unspecified, not intractable, without status epilepticus: Secondary | ICD-10-CM | POA: Diagnosis present

## 2020-03-16 DIAGNOSIS — Z803 Family history of malignant neoplasm of breast: Secondary | ICD-10-CM

## 2020-03-16 DIAGNOSIS — N3090 Cystitis, unspecified without hematuria: Secondary | ICD-10-CM

## 2020-03-16 DIAGNOSIS — M79605 Pain in left leg: Secondary | ICD-10-CM | POA: Diagnosis present

## 2020-03-16 DIAGNOSIS — R9431 Abnormal electrocardiogram [ECG] [EKG]: Secondary | ICD-10-CM | POA: Diagnosis present

## 2020-03-16 DIAGNOSIS — N390 Urinary tract infection, site not specified: Secondary | ICD-10-CM | POA: Diagnosis present

## 2020-03-16 DIAGNOSIS — R110 Nausea: Secondary | ICD-10-CM | POA: Diagnosis present

## 2020-03-16 DIAGNOSIS — R630 Anorexia: Secondary | ICD-10-CM | POA: Diagnosis present

## 2020-03-16 DIAGNOSIS — Z832 Family history of diseases of the blood and blood-forming organs and certain disorders involving the immune mechanism: Secondary | ICD-10-CM

## 2020-03-16 DIAGNOSIS — I10 Essential (primary) hypertension: Secondary | ICD-10-CM | POA: Diagnosis present

## 2020-03-16 DIAGNOSIS — R600 Localized edema: Secondary | ICD-10-CM

## 2020-03-16 DIAGNOSIS — Z79899 Other long term (current) drug therapy: Secondary | ICD-10-CM

## 2020-03-16 DIAGNOSIS — Z970 Presence of artificial eye: Secondary | ICD-10-CM

## 2020-03-16 DIAGNOSIS — F17210 Nicotine dependence, cigarettes, uncomplicated: Secondary | ICD-10-CM | POA: Diagnosis present

## 2020-03-16 DIAGNOSIS — R531 Weakness: Secondary | ICD-10-CM | POA: Diagnosis present

## 2020-03-16 LAB — CBC WITH DIFFERENTIAL/PLATELET
Abs Immature Granulocytes: 0.03 10*3/uL (ref 0.00–0.07)
Basophils Absolute: 0 10*3/uL (ref 0.0–0.1)
Basophils Relative: 0 %
Eosinophils Absolute: 0.2 10*3/uL (ref 0.0–0.5)
Eosinophils Relative: 2 %
HCT: 33.9 % — ABNORMAL LOW (ref 36.0–46.0)
Hemoglobin: 9.7 g/dL — ABNORMAL LOW (ref 12.0–15.0)
Immature Granulocytes: 0 %
Lymphocytes Relative: 19 %
Lymphs Abs: 1.9 10*3/uL (ref 0.7–4.0)
MCH: 18.4 pg — ABNORMAL LOW (ref 26.0–34.0)
MCHC: 28.6 g/dL — ABNORMAL LOW (ref 30.0–36.0)
MCV: 64.4 fL — ABNORMAL LOW (ref 80.0–100.0)
Monocytes Absolute: 0.7 10*3/uL (ref 0.1–1.0)
Monocytes Relative: 7 %
Neutro Abs: 7.3 10*3/uL (ref 1.7–7.7)
Neutrophils Relative %: 72 %
Platelets: 432 10*3/uL — ABNORMAL HIGH (ref 150–400)
RBC: 5.26 MIL/uL — ABNORMAL HIGH (ref 3.87–5.11)
RDW: 23.9 % — ABNORMAL HIGH (ref 11.5–15.5)
WBC: 10.1 10*3/uL (ref 4.0–10.5)
nRBC: 0 % (ref 0.0–0.2)

## 2020-03-16 LAB — COMPREHENSIVE METABOLIC PANEL
ALT: 11 U/L (ref 0–44)
AST: 16 U/L (ref 15–41)
Albumin: 3.9 g/dL (ref 3.5–5.0)
Alkaline Phosphatase: 85 U/L (ref 38–126)
Anion gap: 11 (ref 5–15)
BUN: 14 mg/dL (ref 6–20)
CO2: 26 mmol/L (ref 22–32)
Calcium: 8.7 mg/dL — ABNORMAL LOW (ref 8.9–10.3)
Chloride: 98 mmol/L (ref 98–111)
Creatinine, Ser: 0.66 mg/dL (ref 0.44–1.00)
GFR calc Af Amer: >60 mL/min (ref >60)
GFR calc non Af Amer: >60 mL/min (ref >60)
Glucose, Bld: 87 mg/dL (ref 70–99)
Potassium: 3.1 mmol/L — ABNORMAL LOW (ref 3.5–5.1)
Sodium: 135 mmol/L (ref 135–145)
Total Bilirubin: 0.8 mg/dL (ref 0.3–1.2)
Total Protein: 9.3 g/dL — ABNORMAL HIGH (ref 6.5–8.1)

## 2020-03-16 LAB — URINALYSIS, COMPLETE (UACMP) WITH MICROSCOPIC
Bilirubin Urine: NEGATIVE
Glucose, UA: NEGATIVE mg/dL
Hgb urine dipstick: NEGATIVE
Ketones, ur: NEGATIVE mg/dL
Nitrite: POSITIVE — AB
Protein, ur: NEGATIVE mg/dL
Specific Gravity, Urine: 1.017 (ref 1.005–1.030)
WBC, UA: >50 WBC/hpf — ABNORMAL HIGH (ref 0–5)
pH: 6 (ref 5.0–8.0)

## 2020-03-16 LAB — SARS CORONAVIRUS 2 BY RT PCR (HOSPITAL ORDER, PERFORMED IN ~~LOC~~ HOSPITAL LAB): SARS Coronavirus 2: POSITIVE — AB

## 2020-03-16 LAB — TROPONIN I (HIGH SENSITIVITY): Troponin I (High Sensitivity): <2 ng/L (ref <18)

## 2020-03-16 LAB — LACTIC ACID, PLASMA: Lactic Acid, Venous: 1.2 mmol/L (ref 0.5–1.9)

## 2020-03-16 MED ORDER — POLYETHYLENE GLYCOL 3350 17 G PO PACK
17.0000 g | PACK | Freq: Every day | ORAL | Status: DC | PRN
  Administered 2020-03-20: 17 g via ORAL
  Filled 2020-03-16 (×2): qty 1

## 2020-03-16 MED ORDER — THIAMINE HCL 100 MG PO TABS
100.0000 mg | ORAL_TABLET | Freq: Every day | ORAL | Status: DC
  Administered 2020-03-19: 100 mg via ORAL
  Filled 2020-03-16 (×4): qty 1

## 2020-03-16 MED ORDER — NORTRIPTYLINE HCL 10 MG PO CAPS
20.0000 mg | ORAL_CAPSULE | Freq: Every day | ORAL | Status: DC
  Administered 2020-03-17 – 2020-03-19 (×4): 20 mg via ORAL
  Filled 2020-03-16 (×6): qty 2

## 2020-03-16 MED ORDER — DICYCLOMINE HCL 10 MG PO CAPS
10.0000 mg | ORAL_CAPSULE | Freq: Three times a day (TID) | ORAL | Status: DC | PRN
  Filled 2020-03-16: qty 1

## 2020-03-16 MED ORDER — OXYCODONE-ACETAMINOPHEN 5-325 MG PO TABS
1.0000 | ORAL_TABLET | ORAL | Status: DC | PRN
  Administered 2020-03-16 – 2020-03-19 (×8): 1 via ORAL
  Filled 2020-03-16 (×8): qty 1

## 2020-03-16 MED ORDER — SODIUM CHLORIDE 0.9 % IV SOLN
1.0000 g | Freq: Once | INTRAVENOUS | Status: AC
  Administered 2020-03-16: 1 g via INTRAVENOUS
  Filled 2020-03-16: qty 10

## 2020-03-16 MED ORDER — AMLODIPINE BESYLATE 5 MG PO TABS
5.0000 mg | ORAL_TABLET | Freq: Every day | ORAL | Status: DC
  Administered 2020-03-18 – 2020-03-20 (×3): 5 mg via ORAL
  Filled 2020-03-16 (×4): qty 1

## 2020-03-16 MED ORDER — CYCLOBENZAPRINE HCL 10 MG PO TABS: 10.0000 mg | ORAL_TABLET | Freq: Three times a day (TID) | ORAL | Status: DC | PRN

## 2020-03-16 MED ORDER — SODIUM CHLORIDE 0.9 % IV SOLN
1.0000 g | INTRAVENOUS | Status: DC
  Administered 2020-03-17 – 2020-03-19 (×3): 1 g via INTRAVENOUS
  Filled 2020-03-16: qty 1
  Filled 2020-03-16: qty 10
  Filled 2020-03-16: qty 1

## 2020-03-16 MED ORDER — FERROUS SULFATE 325 (65 FE) MG PO TABS
325.0000 mg | ORAL_TABLET | Freq: Every day | ORAL | Status: DC
  Administered 2020-03-18 – 2020-03-20 (×3): 325 mg via ORAL
  Filled 2020-03-16 (×5): qty 1

## 2020-03-16 MED ORDER — DULOXETINE HCL 30 MG PO CPEP
30.0000 mg | ORAL_CAPSULE | Freq: Every day | ORAL | Status: DC
  Administered 2020-03-18 – 2020-03-20 (×3): 30 mg via ORAL
  Filled 2020-03-16 (×5): qty 1

## 2020-03-16 MED ORDER — FLUTICASONE PROPIONATE 50 MCG/ACT NA SUSP
1.0000 | Freq: Every day | NASAL | Status: DC | PRN
  Filled 2020-03-16: qty 16

## 2020-03-16 MED ORDER — FOLIC ACID 1 MG PO TABS
1.0000 mg | ORAL_TABLET | Freq: Every day | ORAL | Status: DC
  Administered 2020-03-19 – 2020-03-20 (×2): 1 mg via ORAL
  Filled 2020-03-16 (×4): qty 1

## 2020-03-16 MED ORDER — ENOXAPARIN SODIUM 40 MG/0.4ML ~~LOC~~ SOLN
40.0000 mg | SUBCUTANEOUS | Status: DC
  Administered 2020-03-16 – 2020-03-19 (×4): 40 mg via SUBCUTANEOUS
  Filled 2020-03-16 (×4): qty 0.4

## 2020-03-16 MED ORDER — PANTOPRAZOLE SODIUM 40 MG PO TBEC
40.0000 mg | DELAYED_RELEASE_TABLET | Freq: Every day | ORAL | Status: DC
  Administered 2020-03-17 – 2020-03-20 (×4): 40 mg via ORAL
  Filled 2020-03-16 (×4): qty 1

## 2020-03-16 MED ORDER — HYDROCHLOROTHIAZIDE 12.5 MG PO CAPS
12.5000 mg | ORAL_CAPSULE | Freq: Every day | ORAL | Status: DC
  Administered 2020-03-18 – 2020-03-20 (×3): 12.5 mg via ORAL
  Filled 2020-03-16 (×4): qty 1

## 2020-03-16 MED ORDER — VITAMIN B-12 1000 MCG PO TABS
1000.0000 ug | ORAL_TABLET | Freq: Every day | ORAL | Status: DC
  Administered 2020-03-18 – 2020-03-20 (×3): 1000 ug via ORAL
  Filled 2020-03-16 (×5): qty 1

## 2020-03-16 MED ORDER — ADULT MULTIVITAMIN W/MINERALS CH
1.0000 | ORAL_TABLET | Freq: Every day | ORAL | Status: DC
  Administered 2020-03-19: 1 via ORAL
  Filled 2020-03-16 (×4): qty 1

## 2020-03-16 MED ORDER — SODIUM CHLORIDE 0.9% FLUSH
3.0000 mL | Freq: Two times a day (BID) | INTRAVENOUS | Status: DC
  Administered 2020-03-18 – 2020-03-20 (×6): 3 mL via INTRAVENOUS

## 2020-03-16 MED ORDER — NEOMYCIN-POLYMYXIN-DEXAMETH 3.5-10000-0.1 OP SUSP
2.0000 [drp] | Freq: Two times a day (BID) | OPHTHALMIC | Status: DC | PRN
  Filled 2020-03-16: qty 5

## 2020-03-16 MED ORDER — TRAZODONE HCL 50 MG PO TABS
100.0000 mg | ORAL_TABLET | Freq: Every day | ORAL | Status: DC
  Administered 2020-03-17 – 2020-03-19 (×4): 100 mg via ORAL
  Filled 2020-03-16: qty 1
  Filled 2020-03-16 (×2): qty 2
  Filled 2020-03-16: qty 1

## 2020-03-16 MED ORDER — ALBUTEROL SULFATE HFA 108 (90 BASE) MCG/ACT IN AERS
1.0000 | INHALATION_SPRAY | Freq: Four times a day (QID) | RESPIRATORY_TRACT | Status: DC | PRN
  Filled 2020-03-16: qty 6.7
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
## 2020-03-16 NOTE — ED Notes (Signed)
Lights turned off and pt resting in NAD

## 2020-03-16 NOTE — ED Triage Notes (Signed)
Pt states that she had her covid shot on Monday and has not been feeling well since- pt stated that she has had a cough, body aches, and generally has not been feeling well- pt states she thinks she may also have a UTI

## 2020-03-16 NOTE — ED Notes (Signed)
Pt requesting medicine for back pain
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
## 2020-03-16 NOTE — ED Notes (Signed)
Had moderate amount soft brown stool-incontinent.  Also urinated in diaper.  All was changed including bed.

## 2020-03-16 NOTE — ED Notes (Signed)
Sent Dr Scotty Court secure chat regarding pt's back pain

## 2020-03-16 NOTE — ED Notes (Signed)
Pt moved from flex to room 36.  Care assumed, report received.
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
## 2020-03-16 NOTE — ED Notes (Signed)
Pt given cup of ice, cleaned of incont urine, purewick placed. Pt able to turn herself in the bed.

## 2020-03-16 NOTE — ED Notes (Signed)
Hospitalist at bedside 
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
## 2020-03-16 NOTE — ED Notes (Signed)
Introduced self to pt. Pt requesting phone to speak to sister. Denies further needs.

## 2020-03-16 NOTE — ED Provider Notes (Signed)
Angiocath insertion  Date/Time: 03/16/2020 6:50 PM Performed by: Sharman Cheek, MD Authorized by: Sharman Cheek, MD  Preparation: Patient was prepped and draped in the usual sterile fashion. Local anesthesia used: no  Anesthesia: Local anesthesia used: no  Sedation: Patient sedated: no  Patient tolerance: patient tolerated the procedure well with no immediate complications Comments: [place] visualization, good blood return, flush easily, R forearm, EBL 0        ----------------------------------------- 6:50 PM on 03/16/2020 ----------------------------------------- Medical screening examination/treatment/procedure(s) were conducted as a shared visit with non-physician practitioner(s) and myself.  I personally evaluated the patient during the encounter.      Sharman Cheek, MD 03/16/20 [phone]

## 2020-03-17 ENCOUNTER — Encounter: Payer: Self-pay | Admitting: Internal Medicine

## 2020-03-17 DIAGNOSIS — Z23 Encounter for immunization: Secondary | ICD-10-CM | POA: Diagnosis not present

## 2020-03-17 DIAGNOSIS — Z832 Family history of diseases of the blood and blood-forming organs and certain disorders involving the immune mechanism: Secondary | ICD-10-CM | POA: Diagnosis not present

## 2020-03-17 DIAGNOSIS — R630 Anorexia: Secondary | ICD-10-CM | POA: Diagnosis present

## 2020-03-17 DIAGNOSIS — N390 Urinary tract infection, site not specified: Secondary | ICD-10-CM | POA: Diagnosis present

## 2020-03-17 DIAGNOSIS — R531 Weakness: Secondary | ICD-10-CM | POA: Diagnosis present

## 2020-03-17 DIAGNOSIS — I890 Lymphedema, not elsewhere classified: Secondary | ICD-10-CM | POA: Diagnosis present

## 2020-03-17 DIAGNOSIS — U071 COVID-19: Secondary | ICD-10-CM | POA: Diagnosis present

## 2020-03-17 DIAGNOSIS — I10 Essential (primary) hypertension: Secondary | ICD-10-CM | POA: Diagnosis present

## 2020-03-17 DIAGNOSIS — M79605 Pain in left leg: Secondary | ICD-10-CM | POA: Diagnosis present

## 2020-03-17 DIAGNOSIS — D509 Iron deficiency anemia, unspecified: Secondary | ICD-10-CM

## 2020-03-17 DIAGNOSIS — R9431 Abnormal electrocardiogram [ECG] [EKG]: Secondary | ICD-10-CM | POA: Diagnosis present

## 2020-03-17 DIAGNOSIS — Z79899 Other long term (current) drug therapy: Secondary | ICD-10-CM | POA: Diagnosis not present

## 2020-03-17 DIAGNOSIS — G40909 Epilepsy, unspecified, not intractable, without status epilepticus: Secondary | ICD-10-CM | POA: Diagnosis present

## 2020-03-17 DIAGNOSIS — F17210 Nicotine dependence, cigarettes, uncomplicated: Secondary | ICD-10-CM | POA: Diagnosis present

## 2020-03-17 DIAGNOSIS — Z803 Family history of malignant neoplasm of breast: Secondary | ICD-10-CM | POA: Diagnosis not present

## 2020-03-17 DIAGNOSIS — R600 Localized edema: Secondary | ICD-10-CM

## 2020-03-17 DIAGNOSIS — Z970 Presence of artificial eye: Secondary | ICD-10-CM | POA: Diagnosis not present

## 2020-03-17 DIAGNOSIS — R110 Nausea: Secondary | ICD-10-CM | POA: Diagnosis present

## 2020-03-17 HISTORY — DX: Abnormal electrocardiogram (ECG) (EKG): R94.31

## 2020-03-17 LAB — CBC
HCT: 30.8 % — ABNORMAL LOW (ref 36.0–46.0)
Hemoglobin: 8.9 g/dL — ABNORMAL LOW (ref 12.0–15.0)
MCH: 18.5 pg — ABNORMAL LOW (ref 26.0–34.0)
MCHC: 28.9 g/dL — ABNORMAL LOW (ref 30.0–36.0)
MCV: 64 fL — ABNORMAL LOW (ref 80.0–100.0)
Platelets: 402 10*3/uL — ABNORMAL HIGH (ref 150–400)
RBC: 4.81 MIL/uL (ref 3.87–5.11)
RDW: 23.5 % — ABNORMAL HIGH (ref 11.5–15.5)
WBC: 8.4 10*3/uL (ref 4.0–10.5)
nRBC: 0 % (ref 0.0–0.2)

## 2020-03-17 LAB — BASIC METABOLIC PANEL
Anion gap: 10 (ref 5–15)
BUN: 15 mg/dL (ref 6–20)
CO2: 25 mmol/L (ref 22–32)
Calcium: 8.6 mg/dL — ABNORMAL LOW (ref 8.9–10.3)
Chloride: 99 mmol/L (ref 98–111)
Creatinine, Ser: 0.77 mg/dL (ref 0.44–1.00)
GFR calc Af Amer: >60 mL/min (ref >60)
GFR calc non Af Amer: >60 mL/min (ref >60)
Glucose, Bld: 98 mg/dL (ref 70–99)
Potassium: 3.3 mmol/L — ABNORMAL LOW (ref 3.5–5.1)
Sodium: 134 mmol/L — ABNORMAL LOW (ref 135–145)

## 2020-03-17 MED ORDER — ACETAMINOPHEN 325 MG PO TABS
ORAL_TABLET | ORAL | Status: AC
  Administered 2020-03-17: 650 mg via ORAL
  Filled 2020-03-17: qty 2

## 2020-03-17 MED ORDER — POTASSIUM CHLORIDE CRYS ER 20 MEQ PO TBCR
40.0000 meq | EXTENDED_RELEASE_TABLET | Freq: Once | ORAL | Status: AC
  Administered 2020-03-17: 40 meq via ORAL
  Filled 2020-03-17: qty 2

## 2020-03-17 MED ORDER — SODIUM CHLORIDE 0.9 % IV SOLN: INTRAVENOUS | Status: DC | PRN

## 2020-03-17 MED ORDER — DIPHENHYDRAMINE HCL 50 MG/ML IJ SOLN: 50.0000 mg | Freq: Once | INTRAMUSCULAR | Status: DC | PRN

## 2020-03-17 MED ORDER — FAMOTIDINE IN NACL 20-0.9 MG/50ML-% IV SOLN
20.0000 mg | Freq: Once | INTRAVENOUS | Status: AC | PRN
  Administered 2020-03-20: 06:00:00 20 mg via INTRAVENOUS
  Filled 2020-03-17: qty 50

## 2020-03-17 MED ORDER — EPINEPHRINE 0.3 MG/0.3ML IJ SOAJ
0.3000 mg | Freq: Once | INTRAMUSCULAR | Status: DC | PRN
  Filled 2020-03-17: qty 0.3

## 2020-03-17 MED ORDER — METHYLPREDNISOLONE SODIUM SUCC 125 MG IJ SOLR: 125.0000 mg | Freq: Once | INTRAMUSCULAR | Status: DC | PRN

## 2020-03-17 MED ORDER — ACETAMINOPHEN 325 MG PO TABS
650.0000 mg | ORAL_TABLET | Freq: Four times a day (QID) | ORAL | Status: DC | PRN
  Administered 2020-03-19: 650 mg via ORAL
  Filled 2020-03-17: qty 2

## 2020-03-17 MED ORDER — SODIUM CHLORIDE 0.9 % IV SOLN
1200.0000 mg | Freq: Once | INTRAVENOUS | Status: AC
  Administered 2020-03-17: 1200 mg via INTRAVENOUS
  Filled 2020-03-17: qty 10

## 2020-03-17 MED ORDER — ALBUTEROL SULFATE HFA 108 (90 BASE) MCG/ACT IN AERS
2.0000 | INHALATION_SPRAY | Freq: Once | RESPIRATORY_TRACT | Status: DC | PRN
  Filled 2020-03-17: qty 6.7

## 2020-03-17 NOTE — ED Notes (Signed)
Breakfast tray given. °

## 2020-03-17 NOTE — Progress Notes (Addendum)
PROGRESS NOTE  Victoria Gross:[id number] DOB: 12/25/1970 DOA: 03/16/2020 [facility]  Brief History   49 year old woman presented with generalized weakness and cough, left leg swelling.  Admitted for mild Covid, generalized weakness, UTI.   A & P  COVID-19 virus infection Mild cough but otherwise asymptomatic. --CXR NAD --oxygen 100% on RA --inflammatory markers not ordered or needed at this point --Tx  MAB -- d/w pt who consents. Discussed with pharmacy and Dr. Kendrick Fries  UTI (urinary tract infection) --better --follow-up culture data  Iron deficiency anemia --stable; f/u as outpt  Leg edema, left --no DVT --tender to touch but no evidence of infection --supportive care  Seizure disorder (HCC) --stable  Prolonged QT interval --minimal, chronic; continue chronic meds   Disposition Plan:  Discussion: improving; continue tx as above. PT eval. Likely home 1-2 days  Status is: Inpatient  Remains inpatient appropriate because:IV treatments appropriate due to intensity of illness or inability to take PO   Dispo: The patient is from: Home              Anticipated d/c is to: Home              Anticipated d/c date is: 1 day              Patient currently is not medically stable to d/c.  DVT prophylaxis: enoxaparin (LOVENOX) injection 40 mg Start: 03/16/20 2200 Code Status: Full Family Communication: none  Brendia Sacks, MD  [facility] Direct contact: see [email or web address] (further directions at bottom of note if needed) 7PM-7AM contact night coverage as at bottom of note 03/17/2020, 2:42 PM  LOS: 0 days   Significant Hospital Events       Consults:      Procedures:     Significant Diagnostic Tests:      Micro Data:      Antimicrobials:     Interval History/Subjective  C/o left leg pain, swelling and back pain Breathing fine, no SOB  Objective   Vitals:  Vitals:   03/17/20 0800 03/17/20 1000  BP:  132/72 102/68  Pulse: 80 78  Resp: 13 10  Temp:    SpO2: 100% 100%    Exam:  Constitutional:    Appears calm and comfortable ENMT:   grossly normal hearing  Respiratory:   CTA bilaterally, no w/r/r.   Respiratory effort normal.  Cardiovascular:   RRR, no m/r/g  2+ LLE extremity edema   Musculoskeletal:   LLE tender to touch Skin:   No rashes, lesions, ulcers Psychiatric:   Mental status o Mood, affect appropriate  I have personally reviewed the following:   Today's Data   K+ 3.3, BMP otherwise unremarkable  Hgb stable at 8.9  Scheduled Meds:  amLODipine  5 mg Oral Daily   DULoxetine  30 mg Oral Daily   enoxaparin (LOVENOX) injection  40 mg Subcutaneous Q24H   ferrous sulfate  325 mg Oral Q breakfast   folic acid  1 mg Oral Daily   hydrochlorothiazide  12.5 mg Oral Daily   multivitamin with minerals  1 tablet Oral Daily   nortriptyline  20 mg Oral QHS   pantoprazole  40 mg Oral Daily   sodium chloride flush  3 mL Intravenous Q12H   thiamine  100 mg Oral Daily   traZODone  100 mg Oral QHS   vitamin B-12  1,000 mcg Oral Daily   Continuous Infusions:  sodium chloride     casirivimab-imdevimab (REGEN-COV) IVPB  cefTRIAXone (ROCEPHIN)  IV     famotidine (PEPCID) IV      Principal Problem:   UTI (urinary tract infection) Active Problems:   Leg edema, left   Lymphedema   Iron deficiency anemia   COVID-19 virus infection   Prolonged QT interval   LOS: 0 days   How to contact the [facility] Attending or Consulting provider 7A - 7P or covering provider during after hours 7P -7A, for this patient?  1. Check the care team in [facility] and look for a) attending/consulting [facility] provider listed and b) the [facility] team listed 2. Log into [email or web address] and use [facility]'s universal password to access. If you do not have the password, please contact the hospital operator. 3. Locate the [facility] provider you are looking for under [facility] and page  to a number that you can be directly reached. 4. If you still have difficulty reaching the provider, please page the [facility] (Director on Call) for the Hospitalists listed on amion for assistance.

## 2020-03-17 NOTE — ED Notes (Signed)
Lunch tray given. Pt did not have much appetite. Snacks were offered.

## 2020-03-17 NOTE — Assessment & Plan Note (Addendum)
--  Spontaneously improved today.  No evidence of infection. --No DVT --supportive care

## 2020-03-17 NOTE — Assessment & Plan Note (Signed)
stable °

## 2020-03-17 NOTE — Assessment & Plan Note (Addendum)
Mild cough but otherwise asymptomatic. --8/21 CXR NAD --oxygen: RA --inflammatory markers not indicated --Tx . S/p MAB 8/22

## 2020-03-17 NOTE — Hospital Course (Addendum)
49 year old woman presented with generalized weakness and cough, left leg swelling.  Admitted for generalized weakness, UTI. COVID very mild and did not require treatment. CXR was clear. Received casirivimab-imdevimab. I treated for UTI with gradual improvement.  Left leg swelling spontaneously improved, venous Dopplers negative for DVT.  Developed nausea and poor appetite 8/24.  Abdominal imaging was unremarkable.  We will continue to monitor, anticipate discharge 8/25

## 2020-03-17 NOTE — ED Notes (Signed)
Pt resting quietly at this time, respirations equal and unlabored. VSS during the night.

## 2020-03-17 NOTE — Assessment & Plan Note (Signed)
--  minimal, chronic; continue chronic meds

## 2020-03-17 NOTE — Assessment & Plan Note (Signed)
--  stable; f/u as outpt

## 2020-03-17 NOTE — ED Notes (Signed)
Pt repositioned in the bed, purewick repositioned as well, no leaks noted at this time. AM labs drawn
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
## 2020-03-17 NOTE — Assessment & Plan Note (Addendum)
--  Appears resolved --culture w/ pansensitive E. coli, BC NGTD --Completed antibiotic today

## 2020-03-18 MED ORDER — ASCORBIC ACID 500 MG PO TABS
500.0000 mg | ORAL_TABLET | Freq: Every day | ORAL | Status: DC
  Administered 2020-03-18 – 2020-03-20 (×3): 500 mg via ORAL
  Filled 2020-03-18 (×3): qty 1

## 2020-03-18 MED ORDER — ZINC SULFATE 220 (50 ZN) MG PO CAPS
220.0000 mg | ORAL_CAPSULE | Freq: Every day | ORAL | Status: DC
  Administered 2020-03-18 – 2020-03-20 (×3): 220 mg via ORAL
  Filled 2020-03-18 (×3): qty 1

## 2020-03-18 NOTE — Progress Notes (Signed)
PT Cancellation Note  Patient Details Name: Victoria Gross MRN: [id number] DOB: 01-29-1971   Cancelled Treatment:    Reason Eval/Treat Not Completed: PT screened, no needs identified, will sign off (Pt has orders for PT evaluation for 8/23. Pt was evaluated on 8/22, found to be at baseline for basic functional mobility. Pt nonAMB at baseline, requires assist for ADL. Please reference eval from 8/22 for detail. No acute skilled PT needs at this time, PT will sign off. Thank you for this consult.   8:56 AM, 03/18/20 Rosamaria Lints, PT, DPT Physical Therapist - [facility]  [phone] (ASCOM)   [name] 03/18/2020, 8:55 AM

## 2020-03-18 NOTE — [place] FL2 (Signed)
[place] MEDICAID FL2 LEVEL OF CARE SCREENING TOOL     IDENTIFICATION  Patient Name: Victoria Gross Birthdate: Jul 19, 1971 Sex: female Admission Date (Current Location): 03/16/2020  [place] and [place] Number:  Chiropodist and Address:  [facility], [address], [place], [place] [address]      Provider Number: [id number]  Attending Physician Name and Address:  Standley Brooking, MD  Relative Name and Phone Number:  Melburn Popper (sister) [phone]    Current Level of Care: Hospital Recommended Level of Care: Skilled Nursing Facility Prior Approval Number:    Date Approved/Denied:   PASRR Number: waived  Discharge Plan: SNF    Current Diagnoses: Patient Active Problem List   Diagnosis Date Noted  . Leg edema, left 03/17/2020  . Prolonged QT interval 03/17/2020  . COVID-19 virus infection 03/16/2020  . Iron deficiency anemia 01/23/2020  . Iron deficiency anemia due to chronic blood loss   . Anemia due to vitamin B12 deficiency   . Acute metabolic encephalopathy   . Ovarian mass   . Acute lower UTI 01/22/2020  . Obesity, Class III, BMI 40-49.9 (morbid obesity) (HCC) 01/22/2020  . UTI (urinary tract infection) 01/22/2020  . Lymphedema 11/02/2019  . GERD (gastroesophageal reflux disease) 11/02/2019  . Essential hypertension 11/02/2019  . Cellulitis 04/01/2019  . Seizure disorder (HCC)   . Prosthetic eye globe   . Anxiety   . ETOH abuse   . Elevated liver enzymes   . Hepatitis C 10/18/2013  . Weakness of left side of body 07/27/1997  . Blindness of left eye 07/27/1997    Orientation RESPIRATION BLADDER Height & Weight     Self, Time, Situation  Normal Incontinent Weight: 91 kg Height:  5' (152.4 cm)  BEHAVIORAL SYMPTOMS/MOOD NEUROLOGICAL BOWEL NUTRITION STATUS      Continent Diet (see discharge summary)  AMBULATORY STATUS COMMUNICATION OF NEEDS Skin   Extensive Assist Verbally Other (Comment) (skin cracking both legs  and blister to breast)                       Personal Care Assistance Level of Assistance  Bathing, Feeding, Dressing Bathing Assistance: Limited assistance Feeding assistance: Limited assistance Dressing Assistance: Limited assistance     Functional Limitations Info  Sight, Hearing Sight Info: Impaired (L eye prosthesis) Hearing Info: Impaired      SPECIAL CARE FACTORS FREQUENCY  PT (By licensed PT), OT (By licensed OT)     PT Frequency: SNF OT Frequency: SNF            Contractures Contractures Info: Present    Additional Factors Info  Isolation Precautions, Code Status, Allergies Code Status Info: Full Allergies Info: Aspirin, prozac, tomatoes     Isolation Precautions Info: aireborne- COVID     Current Medications (03/18/2020):  This is the current hospital active medication list Current Facility-Administered Medications  Medication Dose Route Frequency Provider Last Rate Last Admin  . 0.9 %  sodium chloride infusion   Intravenous PRN Standley Brooking, MD      . acetaminophen (TYLENOL) tablet 650 mg  650 mg Oral Q6H PRN Jimmye Norman, NP   650 mg at 03/17/20 0117  . albuterol (VENTOLIN HFA) 108 (90 Base) MCG/ACT inhaler 1-2 puff  1-2 puff Inhalation Q6H PRN Rayne Du, MD      . albuterol (VENTOLIN HFA) 108 (90 Base) MCG/ACT inhaler 2 puff  2 puff Inhalation Once PRN Standley Brooking, MD      .  amLODipine (NORVASC) tablet 5 mg  5 mg Oral Daily Rayne Du, MD   5 mg at 03/18/20 0942  . cefTRIAXone (ROCEPHIN) 1 g in sodium chloride 0.9 % 100 mL IVPB  1 g Intravenous Q24H Rayne Du, MD   Stopped at 03/17/20 2153  . cyclobenzaprine (FLEXERIL) tablet 10 mg  10 mg Oral TID PRN Rayne Du, MD      . dicyclomine (BENTYL) capsule 10 mg  10 mg Oral TID PRN Rayne Du, MD      . diphenhydrAMINE (BENADRYL) injection 50 mg  50 mg Intravenous Once PRN Standley Brooking, MD      . DULoxetine (CYMBALTA) DR capsule 30 mg  30 mg Oral Daily Rayne Du, MD   30 mg at 03/18/20 0943  . enoxaparin (LOVENOX) injection 40 mg  40 mg Subcutaneous Q24H Rayne Du, MD   40 mg at 03/17/20 2317  . EPINEPHrine (EPI-PEN) injection 0.3 mg  0.3 mg Intramuscular Once PRN Standley Brooking, MD      . famotidine (PEPCID) IVPB 20 mg premix  20 mg Intravenous Once PRN Standley Brooking, MD      . ferrous sulfate tablet 325 mg  325 mg Oral Q breakfast Rayne Du, MD   325 mg at 03/18/20 0942  . fluticasone (FLONASE) 50 MCG/ACT nasal spray 1 spray  1 spray Each Nare Daily PRN Rayne Du, MD      . folic acid (FOLVITE) tablet 1 mg  1 mg Oral Daily Rayne Du, MD      . hydrochlorothiazide (MICROZIDE) capsule 12.5 mg  12.5 mg Oral Daily Rayne Du, MD   12.5 mg at 03/18/20 0942  . methylPREDNISolone sodium succinate (SOLU-MEDROL) 125 mg/2 mL injection 125 mg  125 mg Intravenous Once PRN Standley Brooking, MD      . multivitamin with minerals tablet 1 tablet  1 tablet Oral Daily Rayne Du, MD      . neomycin-polymyxin b-dexamethasone (MAXITROL) ophthalmic suspension 2 drop  2 drop Left Eye BID PRN Rayne Du, MD      . nortriptyline (PAMELOR) capsule 20 mg  20 mg Oral QHS Rayne Du, MD   20 mg at 03/18/20 0022  . oxyCODONE-acetaminophen (PERCOCET/ROXICET) 5-325 MG per tablet 1 tablet  1 tablet Oral Q4H PRN Rayne Du, MD   1 tablet at 03/18/20 0948  . pantoprazole (PROTONIX) EC tablet 40 mg  40 mg Oral Daily Rayne Du, MD   40 mg at 03/18/20 0944  . polyethylene glycol (MIRALAX / GLYCOLAX) packet 17 g  17 g Oral Daily PRN Rayne Du, MD      . sodium chloride flush (NS) 0.9 % injection 3 mL  3 mL Intravenous Q12H Rayne Du, MD   3 mL at 03/18/20 0944  . thiamine tablet 100 mg  100 mg Oral Daily Rayne Du, MD      . traZODone (DESYREL) tablet 100 mg  100 mg Oral QHS Rayne Du, MD   100 mg at 03/17/20 2315  . vitamin B-12 (CYANOCOBALAMIN) tablet 1,000 mcg  1,000 mcg Oral Daily Rayne Du, MD   1,000 mcg at  03/18/20 0109     Discharge Medications: Please see discharge summary for a list of discharge medications.  Relevant Imaging Results:  Relevant Lab Results:   Additional Information SS# [id number]  Allayne Butcher, RN

## 2020-03-18 NOTE — TOC Progression Note (Signed)
Transition of Care [facility]) - Progression Note    Patient Details  Name: LEIGHLA CHESTNUTT MRN: [id number] Date of Birth: [birth year]  Transition [facility]) CM/SW Contact  Allayne Butcher, RN Phone Number: 03/18/2020, 11:13 AM  Clinical Narrative:    Patient reports that she already has a hospital bed at home.  Patient is requesting to go to SNF at discharge, will discuss with PT.    Expected Discharge Plan: Home w Home Health Services Barriers to Discharge: Continued Medical Work up  Expected Discharge Plan and Services Expected Discharge Plan: Home w Home Health Services   Discharge Planning Services: CM Consult Post Acute Care Choice: Home Health Living arrangements for the past 2 months: Single Family Home                 DME Arranged: Hospital bed DME Agency: [facility] Date DME Agency Contacted: 03/18/20 Time DME Agency Contacted: 1030 Representative spoke with at DME Agency: Oletha Cruel HH Arranged: PT, OT, Nurse's Aide, Social Work [employer] Agency: [facility] Date HH Agency Contacted: 03/18/20 Time HH Agency Contacted: 1024 Representative spoke with at [facility] Agency: Feliberto Gottron   Social Determinants of Health (SDOH) Interventions    Readmission Risk Interventions No flowsheet data found.
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
## 2020-03-18 NOTE — Progress Notes (Signed)
PROGRESS NOTE  Victoria Gross CBJ:[id number] DOB: 1971-03-13 DOA: 03/16/2020 [facility]: [facility]  Brief History   49 year old woman presented with generalized weakness and cough, left leg swelling.  Admitted for mild Covid, generalized weakness, UTI. Received casirivimab-imdevimab. Improving. Likely home 24 hours.   A & P  COVID-19 virus infection Mild cough but otherwise asymptomatic. --CXR NAD --oxygen 100% on RA --inflammatory markers not ordered or needed at this point --Tx . MAB 8/22  UTI (urinary tract infection) --improving --culture w/ GNR, BC NGTD --continue current Rx, follow-up culture data  Iron deficiency anemia --stable; f/u as outpt  Leg edema, left --no DVT --tender to touch but no evidence of infection --supportive care  Seizure disorder (HCC) --stable  Prolonged QT interval --minimal, chronic; continue chronic meds   Disposition Plan:  Discussion: improving; home vs SNF 8/24 if continues to improve  Status is: Inpatient  Remains inpatient appropriate because:IV treatments appropriate due to intensity of illness or inability to take PO   Dispo: The patient is from: Home              Anticipated d/c is to: TBD              Anticipated d/c date is: 1 day              Patient currently is not medically stable to d/c.  DVT prophylaxis: enoxaparin (LOVENOX) injection 40 mg Start: 03/16/20 2200 Code Status: Full Family Communication: none  Brendia Sacks, MD  [facility] Direct contact: see [email or web address] (further directions at bottom of note if needed) 7PM-7AM contact night coverage as at bottom of note 03/18/2020, 2:22 PM  LOS: 1 day   Significant Hospital Events   .    Consults:  .    Procedures:  .   Significant Diagnostic Tests:  Marland Kitchen    Micro Data:  .    Antimicrobials:  .   Interval History/Subjective  Reports continue pain in left leg; now pain right foot sole Breathing ok Eating fine  Objective    Vitals:  Vitals:   03/18/20 0856 03/18/20 1100  BP: 136/61 (!) 129/52  Pulse: 89 78  Resp: 18 13  Temp: 98.3 F (36.8 C) 98.2 F (36.8 C)  SpO2: 100% 100%    Exam: Constitutional:   . Appears calm and comfortable ENMT:  . grossly normal hearing  Respiratory:  . CTA bilaterally, no w/r/r.  . Respiratory effort normal. Cardiovascular:  . RRR, no m/r/g . No RLE extremity edema   . LLE edema 2+, no change Musculoskeletal:  . LUE, LLE   . Contractures noted Skin:  . Dry skin on feet Psychiatric:  . Mental status o Mood, affect appropriate  I have personally reviewed the following:   Today's Data  . No new data  Scheduled Meds: . amLODipine  5 mg Oral Daily  . vitamin C  500 mg Oral Daily  . DULoxetine  30 mg Oral Daily  . enoxaparin (LOVENOX) injection  40 mg Subcutaneous Q24H  . ferrous sulfate  325 mg Oral Q breakfast  . folic acid  1 mg Oral Daily  . hydrochlorothiazide  12.5 mg Oral Daily  . multivitamin with minerals  1 tablet Oral Daily  . nortriptyline  20 mg Oral QHS  . pantoprazole  40 mg Oral Daily  . sodium chloride flush  3 mL Intravenous Q12H  . thiamine  100 mg Oral Daily  . traZODone  100 mg Oral QHS  .  vitamin B-12  1,000 mcg Oral Daily  . zinc sulfate  220 mg Oral Daily   Continuous Infusions: . sodium chloride    . cefTRIAXone (ROCEPHIN)  IV Stopped (03/17/20 2153)  . famotidine (PEPCID) IV      Principal Problem:   UTI (urinary tract infection) Active Problems:   Leg edema, left   Lymphedema   Iron deficiency anemia   COVID-19 virus infection   Prolonged QT interval   LOS: 1 day   How to contact the [facility] Attending or Consulting provider 7A - 7P or covering provider during after hours 7P -7A, for this patient?  1. Check the care team in [facility] and look for a) attending/consulting TRH provider listed and b) the [facility] team listed 2. Log into [email or web address] and use 's universal password to access. If you do not have the  password, please contact the hospital operator. 3. Locate the [facility] provider you are looking for under [facility] and page to a number that you can be directly reached. 4. If you still have difficulty reaching the provider, please page the [facility] (Director on Call) for the Hospitalists listed on amion for assistance.
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
## 2020-03-18 NOTE — ED Notes (Signed)
Attempted to call report; nurse was unavailable at this time 

## 2020-03-19 ENCOUNTER — Inpatient Hospital Stay: Payer: Medicare Other

## 2020-03-19 LAB — URINE CULTURE: Culture: >=100000 — AB

## 2020-03-19 MED ORDER — PROCHLORPERAZINE EDISYLATE 10 MG/2ML IJ SOLN
10.0000 mg | Freq: Four times a day (QID) | INTRAMUSCULAR | Status: DC | PRN
  Administered 2020-03-19: 14:00:00 10 mg via INTRAVENOUS
  Filled 2020-03-19 (×3): qty 2

## 2020-03-19 NOTE — TOC Progression Note (Signed)
Transition of Care [facility]) - Progression Note    Patient Details  Name: Victoria Gross MRN: [id number] Date of Birth: 1970/08/19  [facility] CM/SW Contact  Allayne Butcher, RN Phone Number: 03/19/2020, 3:04 PM  Clinical Narrative:     Patient reports that her stomach has been feeling bad today.  Discharge is on hold for now.  When she is medically stable for discharge she will need EMS transport and be going to her [place] home at [address] in [place] because that is where her wheelchair is.  Cousin's name is Iantha Fallen.   Expected Discharge Plan: Home w Home Health Services Barriers to Discharge: Continued Medical Work up  Expected Discharge Plan and Services Expected Discharge Plan: Home w Home Health Services   Discharge Planning Services: CM Consult Post Acute Care Choice: Home Health Living arrangements for the past 2 months: Single Family Home                 DME Arranged: Hospital bed DME Agency: [employer] Date DME Agency Contacted: 03/18/20 Time DME Agency Contacted: 1030 Representative spoke with at DME Agency: Oletha Cruel HH Arranged: PT, OT, Nurse's Aide, Social Work [employer] Agency: [facility] Date HH Agency Contacted: 03/18/20 Time HH Agency Contacted: [phone] Representative spoke with at [facility] Agency: Feliberto Gottron   Social Determinants of Health (SDOH) Interventions    Readmission Risk Interventions No flowsheet data found.

## 2020-03-19 NOTE — Progress Notes (Signed)
PROGRESS NOTE  Victoria Gross BPZ:[id number] DOB: 1971/06/08 DOA: 03/16/2020 PCP: [facility]  Brief History   49 year old woman presented with generalized weakness and cough, left leg swelling.  Admitted for generalized weakness, UTI. COVID very mild and did not require treatment. CXR was clear. Received casirivimab-imdevimab. I treated for UTI with gradual improvement.  Left leg swelling spontaneously improved, venous Dopplers negative for DVT.  Developed nausea and poor appetite 8/24.  Abdominal imaging was unremarkable.  We will continue to monitor, anticipate discharge 8/25   A & P  COVID-19 virus infection Mild cough but otherwise asymptomatic. --8/21 CXR NAD --oxygen: RA --inflammatory markers not indicated --Tx . S/p MAB 8/22  UTI (urinary tract infection) --Appears resolved --culture w/ pansensitive E. coli, BC NGTD --Completed antibiotic today  Iron deficiency anemia --stable; f/u as outpt  Leg edema, left --Spontaneously improved today.  No evidence of infection. --No DVT --supportive care  Seizure disorder (HCC) --stable  Prolonged QT interval --minimal, chronic; continue chronic meds   Disposition Plan:  Discussion: UTI treated.  Only active issue at this point is nausea and poor appetite.  Will monitor clinically.  Spent discharging/25.  Status is: Inpatient  Dispo: The patient is from: Home              Anticipated d/c is to: TBD              Anticipated d/c date is: 1 day              Patient currently is not medically stable to d/c.  DVT prophylaxis: enoxaparin (LOVENOX) injection 40 mg Start: 03/16/20 2200 Code Status: Full Family Communication: none  Brendia Sacks, MD  [facility] Direct contact: see [email or web address] (further directions at bottom of note if needed) 7PM-7AM contact night coverage as at bottom of note 03/19/2020, 4:49 PM  LOS: 2 days     Micro Data:  . UC E coli pansensitive   Antimicrobials:   . Ceftriaxone 8/22 >8/24 . BC NGTD  Interval History/Subjective  Today has nausea, didn't breakfast, feels poorly  Objective   Vitals:  Vitals:   03/19/20 1140 03/19/20 1552  BP: 138/71 128/80  Pulse: 81 81  Resp: 18 16  Temp: 98.6 F (37 C) 98.8 F (37.1 C)  SpO2: 100% 100%    Exam: Constitutional:   . Appears calm, uncomfortable, nontoxic ENMT:  . grossly normal hearing  Respiratory:  . CTA bilaterally, no w/r/r.  . Respiratory effort normal. Cardiovascular:  . RRR, no m/r/g . decreased LLE extremity edema, now 1+ Abdomen:  . Soft, ntnd Musculoskeletal:  . No change noted . Contractures left arm, left leg Psychiatric:  . Mental status o Mood, affect appropriate  I have personally reviewed the following:   Today's Data  . No new data  Scheduled Meds: . amLODipine  5 mg Oral Daily  . vitamin C  500 mg Oral Daily  . DULoxetine  30 mg Oral Daily  . enoxaparin (LOVENOX) injection  40 mg Subcutaneous Q24H  . ferrous sulfate  325 mg Oral Q breakfast  . folic acid  1 mg Oral Daily  . hydrochlorothiazide  12.5 mg Oral Daily  . multivitamin with minerals  1 tablet Oral Daily  . nortriptyline  20 mg Oral QHS  . pantoprazole  40 mg Oral Daily  . sodium chloride flush  3 mL Intravenous Q12H  . thiamine  100 mg Oral Daily  . traZODone  100 mg Oral QHS  .  vitamin B-12  1,000 mcg Oral Daily  . zinc sulfate  220 mg Oral Daily   Continuous Infusions: . sodium chloride    . famotidine (PEPCID) IV      Principal Problem:   UTI (urinary tract infection) Active Problems:   Leg edema, left   Lymphedema   Iron deficiency anemia   COVID-19 virus infection   Prolonged QT interval   LOS: 2 days   How to contact the [facility] Attending or Consulting provider 7A - 7P or covering provider during after hours 7P -7A, for this patient?  1. Check the care team in [facility] and look for a) attending/consulting TRH provider listed and b) the [facility] team listed 2. Log into  [email or web address] and use [place]'s universal password to access. If you do not have the password, please contact the hospital operator. 3. Locate the [facility] provider you are looking for under [facility] and page to a number that you can be directly reached. 4. If you still have difficulty reaching the provider, please page the [facility] (Director on Call) for the Hospitalists listed on amion for assistance.

## 2020-03-20 MED ORDER — SENNOSIDES-DOCUSATE SODIUM 8.6-50 MG PO TABS
1.0000 | ORAL_TABLET | Freq: Two times a day (BID) | ORAL | Status: DC
  Administered 2020-03-20: 11:00:00 1 via ORAL

## 2020-03-20 MED ORDER — KETOROLAC TROMETHAMINE 15 MG/ML IJ SOLN
15.0000 mg | Freq: Once | INTRAMUSCULAR | Status: AC
  Administered 2020-03-20: 15 mg via INTRAVENOUS
  Filled 2020-03-20: qty 1

## 2020-03-20 MED ORDER — SIMETHICONE 80 MG PO CHEW: 80.0000 mg | CHEWABLE_TABLET | Freq: Four times a day (QID) | ORAL | 0 refills | Status: DC | PRN

## 2020-03-20 MED ORDER — SIMETHICONE 80 MG PO CHEW
80.0000 mg | CHEWABLE_TABLET | Freq: Four times a day (QID) | ORAL | Status: DC | PRN
  Administered 2020-03-20: 11:00:00 80 mg via ORAL
  Filled 2020-03-20 (×2): qty 1

## 2020-03-20 MED ORDER — SENNOSIDES-DOCUSATE SODIUM 8.6-50 MG PO TABS: 1.0000 | ORAL_TABLET | Freq: Two times a day (BID) | ORAL | 0 refills | Status: AC

## 2020-03-20 NOTE — Care Management Important Message (Signed)
Important Message  Patient Details  Name: Victoria Gross MRN: [id number] Date of Birth: 23-Nov-1970   Medicare Important Message Given:  Yes  Reviewed with patient via room phone due to isolation status.  Copy of Medicare IM to be left with unit secretary for staff to bring in next time someone enters room.     Johnell Comings 03/20/2020, 11:44 AM

## 2020-03-20 NOTE — Discharge Summary (Signed)
Physician Discharge Summary  Victoria Gross PXT:[id number] DOB: [birth year] DOA: 03/16/2020  PCP: [facility]  Admit date: 03/16/2020 Discharge date: 03/20/2020  Admitted From: Home Disposition: Home  Recommendations for Outpatient Follow-up:  1. Follow up with PCP in 1-2 weeks 2. Follow-up with Dr. Rosita Kea, orthopedic surgery 2 weeks    Home Health: Yes Equipment/Devices: No Discharge Condition: Stable CODE STATUS: Full Diet recommendation: Regular  Brief/Interim Summary: 49 year old woman presented with generalized weakness and cough, left leg swelling.  Admitted for generalized weakness, UTI. COVID very mild and did not require treatment. CXR was clear. Received casirivimab-imdevimab. I treated for UTI with gradual improvement.  Left leg swelling spontaneously improved, venous Dopplers negative for DVT.  Developed nausea and poor appetite 8/24.  Abdominal imaging was unremarkable.  We will continue to monitor, anticipate discharge 8/25  8/25: Patient seen and examined.  Continues to endorse mild abdominal pain.  Abdominal x-ray reviewed.  Results discussed with patient.  Some mild gaseous distention with mild stool burden.  No ileus or obstruction noted.  Patient's belly is soft on exam.  Nontender to palpation.  Positive bowel sounds.  Suspect gas versus constipation.  Recommend simethicone and Senokot-S.  Prescribed on discharge.  Stable for discharge home at this time.   Discharge Diagnoses:  Principal Problem:   UTI (urinary tract infection) Active Problems:   Lymphedema   Iron deficiency anemia   COVID-19 virus infection   Leg edema, left   Prolonged QT interval  COVID-19 virus infection Mild cough but otherwise asymptomatic. --8/21 CXR NAD --oxygen: RA --inflammatory markers not indicated --Tx  S/p MAB 8/22   UTI (urinary tract infection) --Appears resolved --culture w/ pansensitive E. coli, BC NGTD --Completed antibiotic 8/24  Iron  deficiency anemia --stable; f/u as outpt  Leg edema, left --Exact etiology is unclear.  No evidence of infection.  No DVT.  Patient states that she has hardware in her knee from an orthopedic surgery.  Per care everywhere patient sees Dr. Rosita Kea.  I recommend the patient follow-up with Dr. Rosita Kea as outpatient.  2-week follow-up recommended and included in discharge packet  Seizure disorder (HCC) --stable  Prolonged QT interval --minimal, chronic; continue chronic meds   Discharge Instructions  Discharge Instructions    Diet - low sodium heart healthy   Complete by: As directed    Increase activity slowly   Complete by: As directed      Allergies as of 03/20/2020      Reactions   Aspirin Other (See Comments)   Prozac [fluoxetine Hcl]    Tomato Other (See Comments)      Medication List    TAKE these medications   amLODipine 5 MG tablet Commonly known as: NORVASC Take 5 mg by mouth daily.   cyclobenzaprine 10 MG tablet Commonly known as: FLEXERIL Take 10 mg by mouth 3 (three) times daily as needed for muscle spasms.   dicyclomine 10 MG capsule Commonly known as: BENTYL Take 1 capsule (10 mg total) by mouth 3 (three) times daily before meals.   DULoxetine 30 MG capsule Commonly known as: CYMBALTA Take 30 mg by mouth daily.   ferrous sulfate 325 (65 FE) MG tablet Take 325 mg by mouth daily with breakfast.   fluticasone 50 MCG/ACT nasal spray Commonly known as: FLONASE Place 1 spray into both nostrils daily as needed.   folic acid 1 MG tablet Commonly known as: FOLVITE Take 1 tablet (1 mg total) by mouth daily.   hydrochlorothiazide 12.5 MG  capsule Commonly known as: MICROZIDE Take 12.5 mg by mouth daily.   medroxyPROGESTERone 150 MG/ML injection Commonly known as: DEPO-PROVERA Inject 150 mg into the muscle every 3 (three) months.   multivitamin with minerals Tabs tablet Take 1 tablet by mouth daily.   neomycin-polymyxin b-dexamethasone 3.5-10000-0.1  Susp Commonly known as: MAXITROL Place 2 drops into the left eye 2 (two) times daily as needed (eye drainage).   nortriptyline 10 MG capsule Commonly known as: PAMELOR Take 2 capsules (20 mg total) by mouth at bedtime.   omeprazole 20 MG capsule Commonly known as: PRILOSEC Take 20 mg by mouth daily.   polyethylene glycol 17 g packet Commonly known as: MIRALAX / GLYCOLAX Take 17 g by mouth daily.   albuterol 108 (90 Base) MCG/ACT inhaler Commonly known as: VENTOLIN HFA Inhale into the lungs every 6 (six) hours as needed for wheezing or shortness of breath.   ProAir HFA 108 (90 Base) MCG/ACT inhaler Generic drug: albuterol Inhale 2 puffs into the lungs every 6 (six) hours as needed.   senna-docusate 8.6-50 MG tablet Commonly known as: Senokot-S Take 1 tablet by mouth 2 (two) times daily for 7 days.   simethicone 80 MG chewable tablet Commonly known as: MYLICON Chew 1 tablet (80 mg total) by mouth 4 (four) times daily as needed for flatulence.   thiamine 100 MG tablet Take 1 tablet (100 mg total) by mouth daily.   traZODone 50 MG tablet Commonly known as: DESYREL Take 100 mg by mouth at bedtime.   vitamin B-12 1000 MCG tablet Commonly known as: CYANOCOBALAMIN Take 1 tablet (1,000 mcg total) by mouth daily.       Follow-up Information    Kennedy Bucker, MD. Schedule an appointment as soon as possible for a visit in 2 weeks.   Specialty: Orthopedic Surgery Contact information: [address] [place] [address] [phone]        [facility] Go on 04/03/2020.   Specialty: General Practice Why: at 9:00am Contact information: [address] [place] [address] [phone]              Allergies  Allergen Reactions   Aspirin Other (See Comments)   Prozac [Fluoxetine Hcl]    Tomato Other (See Comments)    Consultations:  None   Procedures/Studies: DG Chest 1  View  Result Date: 03/16/2020 CLINICAL DATA:  Cough.  COVID shot/vaccine Monday. EXAM: CHEST  1 VIEW COMPARISON:  February 15, 2020 FINDINGS: The study is limited due to portable technique. No change in the cardiomediastinal silhouette. No pneumothorax. No nodules or masses. No focal infiltrates. Mild haziness over the chest is probably due to the portable technique. IMPRESSION: No acute abnormalities are identified. Limited study due to portable technique. A PA and lateral chest x-ray could better evaluate if concern persists. Electronically Signed   By: Gerome Sam III M.D   On: 03/16/2020 13:06   US Venous Img Lower Unilateral Left  Result Date: 03/16/2020 CLINICAL DATA:  Left lower extremity pain and edema EXAM: LEFT LOWER EXTREMITY VENOUS DOPPLER ULTRASOUND TECHNIQUE: Gray-scale sonography with graded compression, as well as color Doppler and duplex ultrasound were performed to evaluate the lower extremity deep venous systems from the level of the common femoral vein and including the common femoral, femoral, profunda femoral, popliteal and calf veins including the posterior tibial, peroneal and gastrocnemius veins when visible. The superficial great saphenous vein was also interrogated. Spectral Doppler was utilized to  evaluate flow at rest and with distal augmentation maneuvers in the common femoral, femoral and popliteal veins. COMPARISON:  None. FINDINGS: Contralateral Common Femoral Vein: Respiratory phasicity is normal and symmetric with the symptomatic side. No evidence of thrombus. Normal compressibility. Common Femoral Vein: No evidence of thrombus. Normal compressibility, respiratory phasicity and response to augmentation. Saphenofemoral Junction: No evidence of thrombus. Normal compressibility and flow on color Doppler imaging. Profunda Femoral Vein: No evidence of thrombus. Normal compressibility and flow on color Doppler imaging. Femoral Vein: No evidence of thrombus. Normal compressibility,  respiratory phasicity and response to augmentation. Popliteal Vein: No evidence of thrombus. Normal compressibility, respiratory phasicity and response to augmentation. Calf Veins: Limited assessment because of body habitus and peripheral edema. Posterior tibial vein appears patent. Peroneal vein not well visualized. No gross occlusive calf thrombus present. IMPRESSION: No significant left lower extremity DVT. Limited assessment of the calf veins because of body habitus and peripheral edema. Electronically Signed   By: Judie Petit.  Shick M.D.   On: 03/16/2020 13:27   DG Abd Portable 1V  Result Date: 03/19/2020 CLINICAL DATA:  COVID, nausea EXAM: PORTABLE ABDOMEN - 1 VIEW COMPARISON:  01/24/2020 FINDINGS: Air seen throughout the bowel without significant dilatation. Stool burden is mild. No acute osseous abnormality. IMPRESSION: No significant abnormality. Electronically Signed   By: Guadlupe Spanish M.D.   On: 03/19/2020 13:03    (Echo, Carotid, EGD, Colonoscopy, ERCP)    Subjective: Patient seen and examined on day of discharge.  Endorses some abdominal pain otherwise is asymptomatic.  Discharge Exam: Vitals:   03/20/20 0328 03/20/20 0913  BP: (!) 157/84 (!) 146/78  Pulse: 96 90  Resp: 20 17  Temp: 98.6 F (37 C) 98.1 F (36.7 C)  SpO2: 100% 100%   Vitals:   03/19/20 2042 03/20/20 0054 03/20/20 0328 03/20/20 0913  BP: 132/74 130/73 (!) 157/84 (!) 146/78  Pulse: 83 95 96 90  Resp: Temp: 98.7 F (37.1 C) 98.4 F (36.9 C) 98.6 F (37 C) 98.1 F (36.7 C)  TempSrc: Oral Oral Oral Oral  SpO2:  98% 100% 100%  Weight:      Height:        General: Pt is alert, awake, not in acute distress, appears chronically ill Cardiovascular: RRR, S1/S2 +, no rubs, no gallops Respiratory: CTA bilaterally, no wheezing, no rhonchi Abdominal: Soft, NT, ND, bowel sounds + Extremities: Bilateral lower extremity paraplegia.  Left upper extremity contracted.  Left lower extremity swollen.  Mild  tender to palpation    The results of significant diagnostics from this hospitalization (including imaging, microbiology, ancillary and laboratory) are listed below for reference.     Microbiology: Recent Results (from the past 240 hour(s))  Urine culture     Status: Abnormal   Collection Time: 03/16/20 12:25 PM   Specimen: In/Out Cath Urine  Result Value Ref Range Status   Specimen Description   Final    IN/OUT CATH URINE Performed at [facility], [address], [place], [place] [address]    Special Requests   Final    NONE Performed at [facility], [address] [place], [place] [address]    Culture >=100,000 COLONIES/mL ESCHERICHIA COLI (A)  Final   Report Status 03/19/2020 FINAL  Final   Organism ID, Bacteria ESCHERICHIA COLI (A)  Final      Susceptibility   Escherichia coli - MIC*    AMPICILLIN <=2 SENSITIVE Sensitive     CEFAZOLIN <=4 SENSITIVE Sensitive  CEFTRIAXONE <=0.25 SENSITIVE Sensitive     CIPROFLOXACIN <=0.25 SENSITIVE Sensitive     GENTAMICIN <=1 SENSITIVE Sensitive     IMIPENEM <=0.25 SENSITIVE Sensitive     NITROFURANTOIN <=16 SENSITIVE Sensitive     TRIMETH/SULFA <=20 SENSITIVE Sensitive     AMPICILLIN/SULBACTAM <=2 SENSITIVE Sensitive     PIP/TAZO <=4 SENSITIVE Sensitive     * >=100,000 COLONIES/mL ESCHERICHIA COLI  SARS Coronavirus 2 by RT PCR (hospital order, performed in [facility] hospital lab) Nasopharyngeal Urine, Catheterized     Status: Abnormal   Collection Time: 03/16/20 12:27 PM   Specimen: Urine, Catheterized; Nasopharyngeal  Result Value Ref Range Status   SARS Coronavirus 2 POSITIVE (A) NEGATIVE Final    Comment: RESULT CALLED TO, READ BACK BY AND VERIFIED WITH: GREG MOYER 03/16/20 AT 1413 BY AR (NOTE) SARS-CoV-2 target nucleic acids are DETECTED  SARS-CoV-2 RNA is generally detectable in upper respiratory specimens  during the acute phase of infection.  Positive results are indicative  of the presence of  the identified virus, but do not rule out bacterial infection or co-infection with other pathogens not detected by the test.  Clinical correlation with patient history and  other diagnostic information is necessary to determine patient infection status.  The expected result is negative.  Fact Sheet for Patients:   BoilerBrush.com.cyhttps://www.fda.gov/media/136312/download   Fact Sheet for Healthcare Providers:   https://pope.com/https://www.fda.gov/media/136313/download    This test is not yet approved or cleared by the [place] FDA and  has been authorized for detection and/or diagnosis of SARS-CoV-2 by FDA under an Emergency Use Authorization (EUA).  This EUA will remain in effect (meaning this test  can be used) for the duration of  the COVID-19 declaration under Section 564(b)(1) of the Act, 21 U.S.C. section 360-bbb-3(b)(1), unless the authorization is terminated or revoked sooner.  Performed at [facility], [address] [place], [place] [address]   Blood culture (routine x 2)     Status: None (Preliminary result)   Collection Time: 03/16/20  4:17 PM   Specimen: BLOOD RIGHT FOREARM  Result Value Ref Range Status   Specimen Description BLOOD RIGHT FOREARM  Final   Special Requests   Final    BOTTLES DRAWN AEROBIC AND ANAEROBIC Blood Culture results may not be optimal due to an excessive volume of blood received in culture bottles   Culture   Final    NO GROWTH 4 DAYS Performed at [facility], [address] [place], [place] [address]    Report Status PENDING  Incomplete     Labs: BNP (last 3 results) Recent Labs    09/16/19 1441 01/21/20 2139  BNP 51.0 34.9   Basic Metabolic Panel: Recent Labs  Lab 03/16/20 1227 03/17/20 0430  NA 135 134*  K 3.1* 3.3*  CL 98 99  CO2 26 25  GLUCOSE 87 98  BUN 14 15  CREATININE 0.66 0.77  CALCIUM 8.7* 8.6*   Liver Function Tests: Recent Labs  Lab 03/16/20 1227  AST 16  ALT 11  ALKPHOS 85  BILITOT 0.8  PROT 9.3*   ALBUMIN 3.9   No results for input(s): LIPASE, AMYLASE in the last 168 hours. No results for input(s): AMMONIA in the last 168 hours. CBC: Recent Labs  Lab 03/16/20 1227 03/17/20 0430  WBC 10.1 8.4  NEUTROABS 7.3  --   HGB 9.7* 8.9*  HCT 33.9* 30.8*  MCV 64.4* 64.0*  PLT 432* 402*   Cardiac Enzymes: No results for input(s): CKTOTAL,  CKMB, CKMBINDEX, TROPONINI in the last 168 hours. BNP: Invalid input(s): POCBNP CBG: No results for input(s): GLUCAP in the last 168 hours. D-Dimer No results for input(s): DDIMER in the last 72 hours. Hgb A1c No results for input(s): HGBA1C in the last 72 hours. Lipid Profile No results for input(s): CHOL, HDL, LDLCALC, TRIG, CHOLHDL, LDLDIRECT in the last 72 hours. Thyroid function studies No results for input(s): TSH, T4TOTAL, T3FREE, THYROIDAB in the last 72 hours.  Invalid input(s): FREET3 Anemia work up No results for input(s): VITAMINB12, FOLATE, FERRITIN, TIBC, IRON, RETICCTPCT in the last 72 hours. Urinalysis    Component Value Date/Time   COLORURINE YELLOW (A) 03/16/2020 1225   APPEARANCEUR HAZY (A) 03/16/2020 1225   APPEARANCEUR Hazy 03/22/2013 0857   LABSPEC 1.017 03/16/2020 1225   LABSPEC 1.013 03/22/2013 0857   PHURINE 6.0 03/16/2020 1225   GLUCOSEU NEGATIVE 03/16/2020 1225   GLUCOSEU >=500 03/22/2013 0857   HGBUR NEGATIVE 03/16/2020 1225   BILIRUBINUR NEGATIVE 03/16/2020 1225   BILIRUBINUR Negative 03/22/2013 0857   KETONESUR NEGATIVE 03/16/2020 1225   PROTEINUR NEGATIVE 03/16/2020 1225   NITRITE POSITIVE (A) 03/16/2020 1225   LEUKOCYTESUR TRACE (A) 03/16/2020 1225   LEUKOCYTESUR Negative 03/22/2013 0857   Sepsis Labs Invalid input(s): PROCALCITONIN,  WBC,  LACTICIDVEN Microbiology Recent Results (from the past 240 hour(s))  Urine culture     Status: Abnormal   Collection Time: 03/16/20 12:25 PM   Specimen: In/Out Cath Urine  Result Value Ref Range Status   Specimen Description   Final    IN/OUT CATH  URINE Performed at [facility], [address], [place], [place] [address]    Special Requests   Final    NONE Performed at [facility], [address] [place], [place] [address]    Culture >=100,000 COLONIES/mL ESCHERICHIA COLI (A)  Final   Report Status 03/19/2020 FINAL  Final   Organism ID, Bacteria ESCHERICHIA COLI (A)  Final      Susceptibility   Escherichia coli - MIC*    AMPICILLIN <=2 SENSITIVE Sensitive     CEFAZOLIN <=4 SENSITIVE Sensitive     CEFTRIAXONE <=0.25 SENSITIVE Sensitive     CIPROFLOXACIN <=0.25 SENSITIVE Sensitive     GENTAMICIN <=1 SENSITIVE Sensitive     IMIPENEM <=0.25 SENSITIVE Sensitive     NITROFURANTOIN <=16 SENSITIVE Sensitive     TRIMETH/SULFA <=20 SENSITIVE Sensitive     AMPICILLIN/SULBACTAM <=2 SENSITIVE Sensitive     PIP/TAZO <=4 SENSITIVE Sensitive     * >=100,000 COLONIES/mL ESCHERICHIA COLI  SARS Coronavirus 2 by RT PCR (hospital order, performed in [facility] hospital lab) Nasopharyngeal Urine, Catheterized     Status: Abnormal   Collection Time: 03/16/20 12:27 PM   Specimen: Urine, Catheterized; Nasopharyngeal  Result Value Ref Range Status   SARS Coronavirus 2 POSITIVE (A) NEGATIVE Final    Comment: RESULT CALLED TO, READ BACK BY AND VERIFIED WITH: GREG MOYER 03/16/20 AT 1413 BY AR (NOTE) SARS-CoV-2 target nucleic acids are DETECTED  SARS-CoV-2 RNA is generally detectable in upper respiratory specimens  during the acute phase of infection.  Positive results are indicative  of the presence of the identified virus, but do not rule out bacterial infection or co-infection with other pathogens not detected by the test.  Clinical correlation with patient history and  other diagnostic information is necessary to determine patient infection status.  The expected result is negative.  Fact Sheet for Patients:   BoilerBrush.com.cy   Fact Sheet for Healthcare Providers:  https://pope.com/    This test is not yet approved or cleared by the [place] and  has been authorized for detection and/or diagnosis of SARS-CoV-2 by FDA under an Emergency Use Authorization (EUA).  This EUA will remain in effect (meaning this test  can be used) for the duration of  the COVID-19 declaration under Section 564(b)(1) of the Act, 21 U.S.C. section 360-bbb-3(b)(1), unless the authorization is terminated or revoked sooner.  Performed at [facility], [address] [place], [place] [address]   Blood culture (routine x 2)     Status: None (Preliminary result)   Collection Time: 03/16/20  4:17 PM   Specimen: BLOOD RIGHT FOREARM  Result Value Ref Range Status   Specimen Description BLOOD RIGHT FOREARM  Final   Special Requests   Final    BOTTLES DRAWN AEROBIC AND ANAEROBIC Blood Culture results may not be optimal due to an excessive volume of blood received in culture bottles   Culture   Final    NO GROWTH 4 DAYS Performed at [facility], [address], [place], [place] [address]    Report Status PENDING  Incomplete     Time coordinating discharge: Over 30 minutes  SIGNED:   Tresa Moore, MD  [facility] 03/20/2020, 12:07 PM Pager   If 7PM-7AM, please contact night-coverage

## 2020-03-20 NOTE — TOC Transition Note (Signed)
Transition of Care [facility]) - CM/SW Discharge Note   Patient Details  Name: Victoria Gross MRN: [id number] Date of Birth: 10/17/1970  [facility] of [facility] CM/SW Contact:  Allayne Butcher, RN Phone Number: 03/20/2020, 10:26 AM   Clinical Narrative:    Patient is medically cleared for discharge home today.  Patient needs EMS transport and is going to her cousin's home because that is where her wheelchair is.  Iantha Fallen her cousin lives at [address] in [place].  Patient will discharge with [facility] health services through [facility].  Feliberto Gottron with [facility] aware of discharge today.  Patient reports that she has a hospital bed at home and denies any equipment needs.    Final next level of care: Home w Home Health Services Barriers to Discharge: Barriers Resolved   Patient Goals and CMS Choice Patient states their goals for this hospitalization and ongoing recovery are:: Patient reports that she wants to be able to walk a little bit so she can take care of herself CMS Medicare.gov Compare Post Acute Care list provided to:: Patient Choice offered to / list presented to : Patient  Discharge Placement                Patient to be transferred to facility by: [facility] will take patient home Name of family member notified: Patient will notify family Patient and family notified of of transfer: 03/20/20  Discharge Plan and Services   Discharge Planning Services: CM Consult Post Acute Care Choice: Home Health          DME Arranged: Hospital bed DME Agency: [facility] Date DME Agency Contacted: 03/18/20 Time DME Agency Contacted: 1030 Representative spoke with at DME Agency: Oletha Cruel HH Arranged: PT, OT, Nurse's Aide, Social Work [employer] Agency: [facility] Date HH Agency Contacted: 03/20/20 Time HH Agency Contacted: 1025 Representative spoke with at [facility] Agency: Feliberto Gottron  Social Determinants of Health (SDOH) Interventions     Readmission Risk  Interventions No flowsheet data found.

## 2020-03-21 LAB — CULTURE, BLOOD (ROUTINE X 2): Culture: NO GROWTH

## 2020-04-08 ENCOUNTER — Ambulatory Visit: Payer: Medicare Other

## 2020-04-15 DIAGNOSIS — I10 Essential (primary) hypertension: Secondary | ICD-10-CM | POA: Insufficient documentation

## 2020-04-15 DIAGNOSIS — F17210 Nicotine dependence, cigarettes, uncomplicated: Secondary | ICD-10-CM | POA: Insufficient documentation

## 2020-04-15 DIAGNOSIS — R45850 Homicidal ideations: Secondary | ICD-10-CM | POA: Diagnosis not present

## 2020-04-15 DIAGNOSIS — F329 Major depressive disorder, single episode, unspecified: Secondary | ICD-10-CM | POA: Insufficient documentation

## 2020-04-15 DIAGNOSIS — F1414 Cocaine abuse with cocaine-induced mood disorder: Secondary | ICD-10-CM | POA: Insufficient documentation

## 2020-04-15 DIAGNOSIS — Z79899 Other long term (current) drug therapy: Secondary | ICD-10-CM | POA: Diagnosis not present

## 2020-04-15 DIAGNOSIS — F1910 Other psychoactive substance abuse, uncomplicated: Secondary | ICD-10-CM | POA: Diagnosis not present

## 2020-04-15 DIAGNOSIS — R2242 Localized swelling, mass and lump, left lower limb: Secondary | ICD-10-CM | POA: Diagnosis not present

## 2020-04-15 DIAGNOSIS — J40 Bronchitis, not specified as acute or chronic: Secondary | ICD-10-CM | POA: Insufficient documentation

## 2020-04-15 DIAGNOSIS — Z20822 Contact with and (suspected) exposure to covid-19: Secondary | ICD-10-CM | POA: Insufficient documentation

## 2020-04-15 DIAGNOSIS — R45851 Suicidal ideations: Secondary | ICD-10-CM | POA: Diagnosis not present

## 2020-04-15 DIAGNOSIS — F10239 Alcohol dependence with withdrawal, unspecified: Secondary | ICD-10-CM | POA: Diagnosis present

## 2020-04-15 LAB — COMPREHENSIVE METABOLIC PANEL
ALT: 12 U/L (ref 0–44)
AST: 15 U/L (ref 15–41)
Albumin: 3.9 g/dL (ref 3.5–5.0)
Alkaline Phosphatase: 96 U/L (ref 38–126)
Anion gap: 9 (ref 5–15)
BUN: 18 mg/dL (ref 6–20)
CO2: 28 mmol/L (ref 22–32)
Calcium: 9.3 mg/dL (ref 8.9–10.3)
Chloride: 101 mmol/L (ref 98–111)
Creatinine, Ser: 1.01 mg/dL — ABNORMAL HIGH (ref 0.44–1.00)
GFR calc Af Amer: >60 mL/min (ref >60)
GFR calc non Af Amer: >60 mL/min (ref >60)
Glucose, Bld: 93 mg/dL (ref 70–99)
Potassium: 4.1 mmol/L (ref 3.5–5.1)
Sodium: 138 mmol/L (ref 135–145)
Total Bilirubin: 0.9 mg/dL (ref 0.3–1.2)
Total Protein: 9.3 g/dL — ABNORMAL HIGH (ref 6.5–8.1)

## 2020-04-15 LAB — CBC
HCT: 32.1 % — ABNORMAL LOW (ref 36.0–46.0)
Hemoglobin: 9.7 g/dL — ABNORMAL LOW (ref 12.0–15.0)
MCH: 19.1 pg — ABNORMAL LOW (ref 26.0–34.0)
MCHC: 30.2 g/dL (ref 30.0–36.0)
MCV: 63.2 fL — ABNORMAL LOW (ref 80.0–100.0)
Platelets: 416 10*3/uL — ABNORMAL HIGH (ref 150–400)
RBC: 5.08 MIL/uL (ref 3.87–5.11)
RDW: 21.3 % — ABNORMAL HIGH (ref 11.5–15.5)
WBC: 8.1 10*3/uL (ref 4.0–10.5)
nRBC: 0 % (ref 0.0–0.2)

## 2020-04-15 LAB — ACETAMINOPHEN LEVEL: Acetaminophen (Tylenol), Serum: <10 ug/mL — ABNORMAL LOW (ref 10–30)

## 2020-04-15 LAB — SALICYLATE LEVEL: Salicylate Lvl: <7 mg/dL — ABNORMAL LOW (ref 7.0–30.0)

## 2020-04-15 LAB — ETHANOL: Alcohol, Ethyl (B): <10 mg/dL (ref <10)

## 2020-04-15 MED ORDER — THIAMINE HCL 100 MG/ML IJ SOLN: 100.0000 mg | Freq: Every day | INTRAMUSCULAR | Status: DC

## 2020-04-15 MED ORDER — FOLIC ACID 1 MG PO TABS
1.0000 mg | ORAL_TABLET | Freq: Every day | ORAL | Status: DC
  Administered 2020-04-16: 1 mg via ORAL
  Filled 2020-04-15: qty 1

## 2020-04-15 MED ORDER — LORAZEPAM 2 MG/ML IJ SOLN: 1.0000 mg | INTRAMUSCULAR | Status: DC | PRN

## 2020-04-15 MED ORDER — THIAMINE HCL 100 MG PO TABS
100.0000 mg | ORAL_TABLET | Freq: Every day | ORAL | Status: DC
  Administered 2020-04-16: 100 mg via ORAL
  Filled 2020-04-15: qty 1

## 2020-04-15 MED ORDER — LORAZEPAM 1 MG PO TABS
1.0000 mg | ORAL_TABLET | ORAL | Status: DC | PRN
  Administered 2020-04-15: 1 mg via ORAL
  Filled 2020-04-15: qty 1

## 2020-04-15 MED ORDER — ADULT MULTIVITAMIN W/MINERALS CH
1.0000 | ORAL_TABLET | Freq: Every day | ORAL | Status: DC
  Administered 2020-04-16: 1 via ORAL
  Filled 2020-04-15: qty 1

## 2020-04-15 NOTE — ED Triage Notes (Signed)
PT to ED via [facility], ems called by crisis center. c/o ETOH withdrawal. PT has tremors, nausea. Last drink yesterday  Pt also wants L leg checked out, ems reports potential cellulitis d/t hot to touch and swollen, no fevers.  Hx of GWS causing L side paralysis.   133/70 HR 90 98.7 oral

## 2020-04-15 NOTE — ED Triage Notes (Signed)
Pt called EMS to bring her to ED for alcohol and drug rehab. 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 Pt drinks 15 beers, 1 5th of wine per day. Pt has not had anything to drink since 0400 AM. Pt reports HA, light sensitivity, shaking felt but not scene. Emesis prior to arrival. CIWA by this RN in triage noted.

## 2020-04-16 ENCOUNTER — Encounter: Payer: Self-pay | Admitting: Radiology

## 2020-04-16 ENCOUNTER — Emergency Department
Admission: EM | Admit: 2020-04-16 | Discharge: 2020-04-16 | Disposition: A | Discharge status: Home / Self Care | Payer: Medicare Other | Attending: Emergency Medicine | Admitting: Emergency Medicine

## 2020-04-16 ENCOUNTER — Emergency Department: Payer: Medicare Other

## 2020-04-16 DIAGNOSIS — J40 Bronchitis, not specified as acute or chronic: Secondary | ICD-10-CM

## 2020-04-16 DIAGNOSIS — R45851 Suicidal ideations: Secondary | ICD-10-CM

## 2020-04-16 DIAGNOSIS — F1414 Cocaine abuse with cocaine-induced mood disorder: Secondary | ICD-10-CM

## 2020-04-16 DIAGNOSIS — F1910 Other psychoactive substance abuse, uncomplicated: Secondary | ICD-10-CM

## 2020-04-16 DIAGNOSIS — R600 Localized edema: Secondary | ICD-10-CM

## 2020-04-16 LAB — URINALYSIS, COMPLETE (UACMP) WITH MICROSCOPIC
Bilirubin Urine: NEGATIVE
Glucose, UA: NEGATIVE mg/dL
Hgb urine dipstick: NEGATIVE
Ketones, ur: NEGATIVE mg/dL
Leukocytes,Ua: NEGATIVE
Nitrite: NEGATIVE
Protein, ur: NEGATIVE mg/dL
Specific Gravity, Urine: 1.025 (ref 1.005–1.030)
pH: 5 (ref 5.0–8.0)

## 2020-04-16 LAB — URINE DRUG SCREEN, QUALITATIVE (ARMC ONLY)
Amphetamines, Ur Screen: NOT DETECTED
Barbiturates, Ur Screen: NOT DETECTED
Benzodiazepine, Ur Scrn: POSITIVE — AB
Cannabinoid 50 Ng, Ur ~~LOC~~: POSITIVE — AB
Cocaine Metabolite,Ur ~~LOC~~: POSITIVE — AB
MDMA (Ecstasy)Ur Screen: NOT DETECTED
Methadone Scn, Ur: NOT DETECTED
Opiate, Ur Screen: NOT DETECTED
Phencyclidine (PCP) Ur S: NOT DETECTED
Tricyclic, Ur Screen: POSITIVE — AB

## 2020-04-16 LAB — MAGNESIUM: Magnesium: 2.4 mg/dL (ref 1.7–2.4)

## 2020-04-16 LAB — SARS CORONAVIRUS 2 BY RT PCR (HOSPITAL ORDER, PERFORMED IN ~~LOC~~ HOSPITAL LAB): SARS Coronavirus 2: NEGATIVE

## 2020-04-16 MED ORDER — CHLORDIAZEPOXIDE HCL 25 MG PO CAPS
25.0000 mg | ORAL_CAPSULE | Freq: Once | ORAL | Status: AC
  Administered 2020-04-16: 25 mg via ORAL
  Filled 2020-04-16: qty 1
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
## 2020-04-16 NOTE — ED Notes (Signed)
Pt states SI/HI without a specific plan. Pt states pain to the left leg and leg noted to be swollen. Pt changed into hospital gown. Ultrasound at bedside. Pt is sleeping at this time. Pt on pulse ox and BP monitoring for the time, as pt is still a medical patient.

## 2020-04-16 NOTE — ED Notes (Signed)
[facility]  CALLED  FOR  TRANSPORT 
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
## 2020-04-16 NOTE — ED Notes (Addendum)
Pts personal belongings to be secured outside room. Pts personal belongings include: 1 pair white socks 1 pair blue jeans 1 green and white shirt 1 pair black shoes 1 purple bra 29 cents (penies, dimes, nickels) 1 grey necklace 1 green hair tie 1 watch  1 bracelet 1 flip cell phone 1 purple lighter 1 black bag 1 brown pocketbook, no money in it 1 black box cutter given to security in bag with pt label 1 black sunglasses 1 purple lipstick 1 bottle hydrochlorothiazide 3 bottle cyclobenzaprine 1 bottle amlodipine 1 bottle of tramadol. Bottle noted to have bugs in it. As per pharmacy, do not open but place in a tamper proof bag and seal it. Raquel, RN and this RN signed it. This RN walked it down to pharmacy.
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
## 2020-04-16 NOTE — Discharge Instructions (Signed)
[facility]  Non-profit organization in [place] Address: [address] [place] [address], [place], [place] [address] Hours:  Open ? Closes 5PM Phone: [phone]

## 2020-04-16 NOTE — ED Provider Notes (Signed)
The patient has been evaluated at bedside by Dr. Lucianne Muss psychiatry.  Patient is clinically stable.  Not felt to be a danger to self or others.  No SI or Hi.  No indication for inpatient psychiatric admission at this time.  Appropriate for continued outpatient therapy.    Willy Eddy, MD 04/16/20 1136

## 2020-04-16 NOTE — BH Assessment (Addendum)
Writer discussed with the patient about the option for detox/treatment facilities.  Patient was in the agreement with the plan.   Referral information for substance abuse treatment faxed to:  [facility] [phone])   . [facility] [phone])  . [facility] [phone])   [facility] [phone])

## 2020-04-16 NOTE — ED Notes (Signed)
Pt given breakfast tray and repositioned in stretcher.

## 2020-04-16 NOTE — ED Notes (Signed)
Pt resting at this time, all harmful objects out of reach of pt

## 2020-04-16 NOTE — ED Notes (Signed)
Pt denies SI/HI 

## 2020-04-16 NOTE — ED Notes (Signed)
Pt changed of urine incontinence.

## 2020-04-16 NOTE — ED Notes (Signed)
Pt incontinent of urine. Pt bed linen was changed. Pt cleaned at this time. Pt has no further request.

## 2020-05-06 ENCOUNTER — Encounter: Payer: Self-pay | Admitting: Emergency Medicine

## 2020-05-06 ENCOUNTER — Emergency Department: Payer: Medicare Other

## 2020-05-06 ENCOUNTER — Emergency Department
Admission: EM | Admit: 2020-05-06 | Discharge: 2020-05-06 | Disposition: A | Discharge status: Home / Self Care | Payer: Medicare Other | Attending: Emergency Medicine | Admitting: Emergency Medicine

## 2020-05-06 ENCOUNTER — Other Ambulatory Visit: Payer: Medicare Other

## 2020-05-06 ENCOUNTER — Other Ambulatory Visit: Payer: Self-pay

## 2020-05-06 DIAGNOSIS — I10 Essential (primary) hypertension: Secondary | ICD-10-CM | POA: Insufficient documentation

## 2020-05-06 DIAGNOSIS — R600 Localized edema: Secondary | ICD-10-CM | POA: Diagnosis not present

## 2020-05-06 DIAGNOSIS — M25562 Pain in left knee: Secondary | ICD-10-CM | POA: Insufficient documentation

## 2020-05-06 DIAGNOSIS — M79642 Pain in left hand: Secondary | ICD-10-CM | POA: Insufficient documentation

## 2020-05-06 DIAGNOSIS — S0003XA Contusion of scalp, initial encounter: Secondary | ICD-10-CM | POA: Insufficient documentation

## 2020-05-06 DIAGNOSIS — Z79899 Other long term (current) drug therapy: Secondary | ICD-10-CM | POA: Insufficient documentation

## 2020-05-06 DIAGNOSIS — M542 Cervicalgia: Secondary | ICD-10-CM | POA: Diagnosis not present

## 2020-05-06 DIAGNOSIS — N390 Urinary tract infection, site not specified: Secondary | ICD-10-CM | POA: Diagnosis not present

## 2020-05-06 DIAGNOSIS — R0781 Pleurodynia: Secondary | ICD-10-CM

## 2020-05-06 DIAGNOSIS — W19XXXA Unspecified fall, initial encounter: Secondary | ICD-10-CM

## 2020-05-06 DIAGNOSIS — F17210 Nicotine dependence, cigarettes, uncomplicated: Secondary | ICD-10-CM | POA: Insufficient documentation

## 2020-05-06 LAB — URINALYSIS, COMPLETE (UACMP) WITH MICROSCOPIC
Bilirubin Urine: NEGATIVE
Glucose, UA: NEGATIVE mg/dL
Hgb urine dipstick: NEGATIVE
Ketones, ur: NEGATIVE mg/dL
Leukocytes,Ua: NEGATIVE
Nitrite: NEGATIVE
Protein, ur: NEGATIVE mg/dL
Specific Gravity, Urine: 1.013 (ref 1.005–1.030)
pH: 7 (ref 5.0–8.0)

## 2020-05-06 LAB — CBC WITH DIFFERENTIAL/PLATELET
Abs Immature Granulocytes: 0.05 10*3/uL (ref 0.00–0.07)
Basophils Absolute: 0 10*3/uL (ref 0.0–0.1)
Basophils Relative: 0 %
Eosinophils Absolute: 0.1 10*3/uL (ref 0.0–0.5)
Eosinophils Relative: 1 %
HCT: 36.5 % (ref 36.0–46.0)
Hemoglobin: 10.7 g/dL — ABNORMAL LOW (ref 12.0–15.0)
Immature Granulocytes: 0 %
Lymphocytes Relative: 18 %
Lymphs Abs: 2.3 10*3/uL (ref 0.7–4.0)
MCH: 18.6 pg — ABNORMAL LOW (ref 26.0–34.0)
MCHC: 29.3 g/dL — ABNORMAL LOW (ref 30.0–36.0)
MCV: 63.4 fL — ABNORMAL LOW (ref 80.0–100.0)
Monocytes Absolute: 1 10*3/uL (ref 0.1–1.0)
Monocytes Relative: 8 %
Neutro Abs: 9.2 10*3/uL — ABNORMAL HIGH (ref 1.7–7.7)
Neutrophils Relative %: 73 %
Platelets: 418 10*3/uL — ABNORMAL HIGH (ref 150–400)
RBC: 5.76 MIL/uL — ABNORMAL HIGH (ref 3.87–5.11)
RDW: 21.9 % — ABNORMAL HIGH (ref 11.5–15.5)
Smear Review: NORMAL
WBC: 12.7 10*3/uL — ABNORMAL HIGH (ref 4.0–10.5)
nRBC: 0 % (ref 0.0–0.2)

## 2020-05-06 LAB — COMPREHENSIVE METABOLIC PANEL
ALT: 19 U/L (ref 0–44)
AST: 16 U/L (ref 15–41)
Albumin: 4.5 g/dL (ref 3.5–5.0)
Alkaline Phosphatase: 98 U/L (ref 38–126)
Anion gap: 11 (ref 5–15)
BUN: 21 mg/dL — ABNORMAL HIGH (ref 6–20)
CO2: 22 mmol/L (ref 22–32)
Calcium: 9.2 mg/dL (ref 8.9–10.3)
Chloride: 104 mmol/L (ref 98–111)
Creatinine, Ser: 0.86 mg/dL (ref 0.44–1.00)
GFR, Estimated: >60 mL/min (ref >60)
Glucose, Bld: 84 mg/dL (ref 70–99)
Potassium: 4 mmol/L (ref 3.5–5.1)
Sodium: 137 mmol/L (ref 135–145)
Total Bilirubin: 0.8 mg/dL (ref 0.3–1.2)
Total Protein: 9.8 g/dL — ABNORMAL HIGH (ref 6.5–8.1)

## 2020-05-06 LAB — TROPONIN I (HIGH SENSITIVITY): Troponin I (High Sensitivity): 3 ng/L (ref <18)

## 2020-05-06 MED ORDER — CEPHALEXIN 500 MG PO CAPS: 500.0000 mg | ORAL_CAPSULE | Freq: Three times a day (TID) | ORAL | 0 refills | Status: DC

## 2020-05-06 MED ORDER — HYDROCODONE-ACETAMINOPHEN 5-325 MG PO TABS: 1.0000 | ORAL_TABLET | Freq: Four times a day (QID) | ORAL | 0 refills | Status: DC | PRN

## 2020-05-06 MED ORDER — HYDROCODONE-ACETAMINOPHEN 5-325 MG PO TABS
1.0000 | ORAL_TABLET | Freq: Once | ORAL | Status: AC
  Administered 2020-05-06: 1 via ORAL
  Filled 2020-05-06: qty 1

## 2020-05-06 MED ORDER — CEPHALEXIN 500 MG PO CAPS
500.0000 mg | ORAL_CAPSULE | Freq: Once | ORAL | Status: AC
  Administered 2020-05-06: 500 mg via ORAL
  Filled 2020-05-06: qty 1
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
## 2020-05-06 NOTE — ED Triage Notes (Signed)
Pt in via EMS from home with c/o left leg pain for 4 months. Pt also has left chest wall pain that is painful to the touch from a fall. EMS reports BLE are swollen. 140/80, 89 HR, 99% RA

## 2020-05-06 NOTE — ED Notes (Signed)
This RN spoke to emergency contact listed, believed to be "Victoria Gross" at number listed to attempt determine if family is home to care for pt upon discharge and if family is home 24/7 to care for pt, family hung up on RN and states they "don't mess with her anymore" and to stop calling.  Received call from pt sister "Victoria Gross" at [phone] who stated that pt lives with friends Victoria Gross and Junior who are roommates and care for pt 24/7, stated they do not have a phone to call but they are physically home to care for pt.  Pt agrees she receives care 24/7 at home and lives with Victoria Gross and Victoria Gross

## 2020-05-06 NOTE — ED Notes (Signed)
EMS CALLED FOR TRANSPORT HOME.

## 2020-05-06 NOTE — ED Notes (Signed)
Medical necessity given to secretary for transport home.

## 2020-05-06 NOTE — ED Notes (Signed)
Pt unable to sign for d/c. Pt discharged with EMS at this time.

## 2020-05-06 NOTE — Discharge Instructions (Signed)
Follow-up with your primary care provider especially for follow-up of your urinary tract infection.  Begin taking the antibiotics that were sent to your pharmacy 3 times a day for the next 7 days.  Drink lots of fluids with this medication.  Continue with your regular medication.  A prescription for Norco was sent to your pharmacy for the next 2 days.  After that if you continue to have pain you will need to see your primary care provider.

## 2020-05-06 NOTE — ED Notes (Signed)
Pt unable to return home at this time with EMS. Multiple phone calls made to family members, and Molly Maduro [phone] with no answer. Per pt, she lives at home with two other people who take care of her but do not have cell phones. EMS unable to provide safe transport at this time.

## 2020-05-06 NOTE — ED Notes (Signed)
In and out performed with Caitlyn NT and Pharmacist, hospital

## 2020-05-07 LAB — URINE CULTURE

## 2020-05-12 ENCOUNTER — Emergency Department: Payer: Medicare Other

## 2020-05-12 ENCOUNTER — Emergency Department
Admission: EM | Admit: 2020-05-12 | Discharge: 2020-05-12 | Disposition: A | Discharge status: Home / Self Care | Payer: Medicare Other | Attending: Student in an Organized Health Care Education/Training Program | Admitting: Student in an Organized Health Care Education/Training Program

## 2020-05-12 ENCOUNTER — Other Ambulatory Visit: Payer: Self-pay

## 2020-05-12 ENCOUNTER — Encounter: Payer: Self-pay | Admitting: Emergency Medicine

## 2020-05-12 DIAGNOSIS — S20211A Contusion of right front wall of thorax, initial encounter: Secondary | ICD-10-CM

## 2020-05-12 DIAGNOSIS — F17210 Nicotine dependence, cigarettes, uncomplicated: Secondary | ICD-10-CM | POA: Insufficient documentation

## 2020-05-12 DIAGNOSIS — S0003XA Contusion of scalp, initial encounter: Secondary | ICD-10-CM | POA: Insufficient documentation

## 2020-05-12 DIAGNOSIS — Z79899 Other long term (current) drug therapy: Secondary | ICD-10-CM | POA: Insufficient documentation

## 2020-05-12 DIAGNOSIS — I10 Essential (primary) hypertension: Secondary | ICD-10-CM | POA: Insufficient documentation

## 2020-05-12 MED ORDER — ACETAMINOPHEN 325 MG PO TABS
ORAL_TABLET | ORAL | Status: AC
  Administered 2020-05-12: 650 mg
  Filled 2020-05-12: qty 2
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
## 2020-05-12 NOTE — ED Notes (Signed)
Unable to get patient signature due to computer not working. Patient verbalized understanding.
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
## 2020-05-12 NOTE — ED Notes (Signed)
Pt covered in feces. Pt cleaned by this RN, Delsa Grana, and Art therapist. Purewick placed on this pt.

## 2020-05-12 NOTE — Discharge Instructions (Addendum)
Follow-up with your primary care provider if any continued problems.  A book with resources for [facility] was given to you also with your discharge instructions.  This way you may call for assistance when you are around a phone since any consult that is present today would not be able to get in touch with you due to you not having any phone.  Continue taking Tylenol as needed.  Ice to your head and ribs as needed for discomfort.
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
## 2020-05-29 ENCOUNTER — Emergency Department
Admission: EM | Admit: 2020-05-29 | Discharge: 2020-05-29 | Disposition: A | Discharge status: Home / Self Care | Payer: Medicare Other | Attending: Emergency Medicine | Admitting: Emergency Medicine

## 2020-05-29 ENCOUNTER — Other Ambulatory Visit: Payer: Self-pay

## 2020-05-29 DIAGNOSIS — I10 Essential (primary) hypertension: Secondary | ICD-10-CM | POA: Diagnosis not present

## 2020-05-29 DIAGNOSIS — M62432 Contracture of muscle, left forearm: Secondary | ICD-10-CM | POA: Insufficient documentation

## 2020-05-29 DIAGNOSIS — F17210 Nicotine dependence, cigarettes, uncomplicated: Secondary | ICD-10-CM | POA: Diagnosis not present

## 2020-05-29 DIAGNOSIS — Z8616 Personal history of COVID-19: Secondary | ICD-10-CM | POA: Insufficient documentation

## 2020-05-29 DIAGNOSIS — M791 Myalgia, unspecified site: Secondary | ICD-10-CM

## 2020-05-29 LAB — COMPREHENSIVE METABOLIC PANEL
ALT: 14 U/L (ref 0–44)
AST: 18 U/L (ref 15–41)
Albumin: 3.8 g/dL (ref 3.5–5.0)
Alkaline Phosphatase: 78 U/L (ref 38–126)
Anion gap: 12 (ref 5–15)
BUN: 10 mg/dL (ref 6–20)
CO2: 22 mmol/L (ref 22–32)
Calcium: 9.3 mg/dL (ref 8.9–10.3)
Chloride: 103 mmol/L (ref 98–111)
Creatinine, Ser: 0.68 mg/dL (ref 0.44–1.00)
GFR, Estimated: >60 mL/min (ref >60)
Glucose, Bld: 102 mg/dL — ABNORMAL HIGH (ref 70–99)
Potassium: 3.6 mmol/L (ref 3.5–5.1)
Sodium: 137 mmol/L (ref 135–145)
Total Bilirubin: 0.7 mg/dL (ref 0.3–1.2)
Total Protein: 8.7 g/dL — ABNORMAL HIGH (ref 6.5–8.1)

## 2020-05-29 LAB — CBC
HCT: 33.2 % — ABNORMAL LOW (ref 36.0–46.0)
Hemoglobin: 9.7 g/dL — ABNORMAL LOW (ref 12.0–15.0)
MCH: 18.3 pg — ABNORMAL LOW (ref 26.0–34.0)
MCHC: 29.2 g/dL — ABNORMAL LOW (ref 30.0–36.0)
MCV: 62.8 fL — ABNORMAL LOW (ref 80.0–100.0)
Platelets: 460 10*3/uL — ABNORMAL HIGH (ref 150–400)
RBC: 5.29 MIL/uL — ABNORMAL HIGH (ref 3.87–5.11)
RDW: 21.5 % — ABNORMAL HIGH (ref 11.5–15.5)
WBC: 9.8 10*3/uL (ref 4.0–10.5)
nRBC: 0 % (ref 0.0–0.2)

## 2020-05-29 MED ORDER — LIDOCAINE 5 % EX PTCH
1.0000 | MEDICATED_PATCH | CUTANEOUS | Status: DC
  Administered 2020-05-29: 1 via TRANSDERMAL
  Filled 2020-05-29: qty 1

## 2020-05-29 MED ORDER — METHOCARBAMOL 500 MG PO TABS
500.0000 mg | ORAL_TABLET | Freq: Once | ORAL | Status: AC
  Administered 2020-05-29: 500 mg via ORAL
  Filled 2020-05-29: qty 1

## 2020-05-29 MED ORDER — AMLODIPINE BESYLATE 5 MG PO TABS
5.0000 mg | ORAL_TABLET | Freq: Once | ORAL | Status: AC
  Administered 2020-05-29: 5 mg via ORAL
  Filled 2020-05-29: qty 1

## 2020-05-29 MED ORDER — IBUPROFEN 600 MG PO TABS
600.0000 mg | ORAL_TABLET | Freq: Once | ORAL | Status: AC
  Administered 2020-05-29: 600 mg via ORAL
  Filled 2020-05-29: qty 1

## 2020-05-29 MED ORDER — DICYCLOMINE HCL 10 MG PO CAPS
10.0000 mg | ORAL_CAPSULE | Freq: Once | ORAL | Status: AC
  Administered 2020-05-29: 10 mg via ORAL
  Filled 2020-05-29: qty 1

## 2020-05-29 MED ORDER — METHOCARBAMOL 500 MG PO TABS: 500.0000 mg | ORAL_TABLET | Freq: Three times a day (TID) | ORAL | 0 refills | Status: DC | PRN

## 2020-05-29 MED ORDER — ACETAMINOPHEN 500 MG PO TABS
1000.0000 mg | ORAL_TABLET | Freq: Once | ORAL | Status: AC
  Administered 2020-05-29: 1000 mg via ORAL
  Filled 2020-05-29: qty 2

## 2020-05-29 NOTE — Discharge Instructions (Signed)
Use Tylenol for pain and fevers.  Up to 1000 mg per dose, up to 4 times per day.  Do not take more than 4000 mg of Tylenol/acetaminophen within 24 hours..  Return to the ED with any fevers or worsening symptoms.

## 2020-05-29 NOTE — ED Notes (Signed)
Pt discharge to:   [address] [place], [place]   Contact: Daivd Council [phone]

## 2020-05-29 NOTE — ED Triage Notes (Signed)
See first nurse note. 
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
## 2020-06-21 ENCOUNTER — Other Ambulatory Visit: Payer: Self-pay

## 2020-06-21 DIAGNOSIS — F17210 Nicotine dependence, cigarettes, uncomplicated: Secondary | ICD-10-CM | POA: Insufficient documentation

## 2020-06-21 DIAGNOSIS — G8929 Other chronic pain: Secondary | ICD-10-CM | POA: Insufficient documentation

## 2020-06-21 DIAGNOSIS — Z8616 Personal history of COVID-19: Secondary | ICD-10-CM | POA: Diagnosis not present

## 2020-06-21 DIAGNOSIS — I10 Essential (primary) hypertension: Secondary | ICD-10-CM | POA: Insufficient documentation

## 2020-06-21 DIAGNOSIS — Z79899 Other long term (current) drug therapy: Secondary | ICD-10-CM | POA: Diagnosis not present

## 2020-06-21 DIAGNOSIS — I890 Lymphedema, not elsewhere classified: Secondary | ICD-10-CM | POA: Diagnosis not present

## 2020-06-21 DIAGNOSIS — M79662 Pain in left lower leg: Secondary | ICD-10-CM | POA: Diagnosis present

## 2020-06-21 NOTE — ED Triage Notes (Signed)
Pt states she is out of her muscle relaxer meds and was unable to get a refill because her "pharmacy was closed today". Pt requesting pain meds and explained to her that I could not give her anything until a physician evaluated her. Provided warm blankets and asked pt to reposition in recliner.

## 2020-06-21 NOTE — ED Triage Notes (Signed)
Pt comes via EMS from home with c/o left leg pain. Pt states pain and swelling for months. Pt states she is seen here every month for the same issues.

## 2020-06-22 ENCOUNTER — Emergency Department
Admission: EM | Admit: 2020-06-22 | Discharge: 2020-06-22 | Disposition: A | Discharge status: Home / Self Care | Payer: Medicare Other | Attending: Emergency Medicine | Admitting: Emergency Medicine

## 2020-06-22 DIAGNOSIS — I890 Lymphedema, not elsewhere classified: Secondary | ICD-10-CM

## 2020-06-22 DIAGNOSIS — M79605 Pain in left leg: Secondary | ICD-10-CM

## 2020-06-22 MED ORDER — IBUPROFEN 600 MG PO TABS
600.0000 mg | ORAL_TABLET | Freq: Once | ORAL | Status: AC
  Administered 2020-06-22: 600 mg via ORAL
  Filled 2020-06-22: qty 1

## 2020-06-22 MED ORDER — ACETAMINOPHEN 500 MG PO TABS
1000.0000 mg | ORAL_TABLET | Freq: Once | ORAL | Status: AC
  Administered 2020-06-22: 1000 mg via ORAL
  Filled 2020-06-22: qty 2

## 2020-06-22 MED ORDER — TRAMADOL HCL 50 MG PO TABS
50.0000 mg | ORAL_TABLET | Freq: Once | ORAL | Status: AC
  Administered 2020-06-22: 50 mg via ORAL
  Filled 2020-06-22: qty 1

## 2020-06-22 MED ORDER — CYCLOBENZAPRINE HCL 10 MG PO TABS
10.0000 mg | ORAL_TABLET | Freq: Once | ORAL | Status: AC
  Administered 2020-06-22: 10 mg via ORAL
  Filled 2020-06-22: qty 1

## 2020-06-22 NOTE — ED Notes (Signed)
Message left on voicemail for Daivd Council at [phone], to inform of upcoming discharge home via EMS.
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
## 2020-06-22 NOTE — Discharge Instructions (Addendum)
Make sure to call your primary care doctor if you are unable to get your prescriptions tomorrow so they can send it to a different pharmacy.  Return to the emergency room for new or worsening leg pain, fever, chest pain, shortness of breath, redness of your leg.  Otherwise follow-up with your primary care doctor and your vascular surgeon.  Try to keep your leg elevated as much as possible.

## 2020-07-01 ENCOUNTER — Ambulatory Visit (INDEPENDENT_AMBULATORY_CARE_PROVIDER_SITE_OTHER): Payer: Medicare Other | Admitting: Nurse Practitioner

## 2020-07-03 ENCOUNTER — Ambulatory Visit: Payer: Medicare Other | Admitting: Physical Therapy

## 2020-07-08 ENCOUNTER — Ambulatory Visit: Payer: Medicare Other | Admitting: Physical Therapy

## 2020-07-10 ENCOUNTER — Ambulatory Visit: Payer: Medicare Other | Admitting: Physical Therapy

## 2020-07-15 ENCOUNTER — Ambulatory Visit: Payer: Medicare Other | Admitting: Physical Therapy

## 2020-07-15 ENCOUNTER — Emergency Department: Payer: Medicare Other

## 2020-07-15 ENCOUNTER — Other Ambulatory Visit: Payer: Self-pay

## 2020-07-15 ENCOUNTER — Emergency Department
Admission: EM | Admit: 2020-07-15 | Discharge: 2020-07-25 | Disposition: A | Discharge status: Home / Self Care | Payer: Medicare Other | Attending: Emergency Medicine | Admitting: Emergency Medicine

## 2020-07-15 ENCOUNTER — Encounter: Payer: Self-pay | Admitting: Emergency Medicine

## 2020-07-15 DIAGNOSIS — R2689 Other abnormalities of gait and mobility: Secondary | ICD-10-CM | POA: Diagnosis not present

## 2020-07-15 DIAGNOSIS — F17210 Nicotine dependence, cigarettes, uncomplicated: Secondary | ICD-10-CM | POA: Diagnosis not present

## 2020-07-15 DIAGNOSIS — G8929 Other chronic pain: Secondary | ICD-10-CM | POA: Insufficient documentation

## 2020-07-15 DIAGNOSIS — G8194 Hemiplegia, unspecified affecting left nondominant side: Secondary | ICD-10-CM

## 2020-07-15 DIAGNOSIS — G8192 Hemiplegia, unspecified affecting left dominant side: Secondary | ICD-10-CM | POA: Diagnosis not present

## 2020-07-15 DIAGNOSIS — I10 Essential (primary) hypertension: Secondary | ICD-10-CM | POA: Insufficient documentation

## 2020-07-15 DIAGNOSIS — Z20822 Contact with and (suspected) exposure to covid-19: Secondary | ICD-10-CM | POA: Insufficient documentation

## 2020-07-15 DIAGNOSIS — Z79899 Other long term (current) drug therapy: Secondary | ICD-10-CM | POA: Insufficient documentation

## 2020-07-15 DIAGNOSIS — M79662 Pain in left lower leg: Secondary | ICD-10-CM | POA: Diagnosis present

## 2020-07-15 DIAGNOSIS — L8990 Pressure ulcer of unspecified site, unspecified stage: Secondary | ICD-10-CM | POA: Insufficient documentation

## 2020-07-15 LAB — BASIC METABOLIC PANEL
Anion gap: 11 (ref 5–15)
BUN: 9 mg/dL (ref 6–20)
CO2: 25 mmol/L (ref 22–32)
Calcium: 10 mg/dL (ref 8.9–10.3)
Chloride: 104 mmol/L (ref 98–111)
Creatinine, Ser: 0.7 mg/dL (ref 0.44–1.00)
GFR, Estimated: >60 mL/min (ref >60)
Glucose, Bld: 103 mg/dL — ABNORMAL HIGH (ref 70–99)
Potassium: 3.6 mmol/L (ref 3.5–5.1)
Sodium: 140 mmol/L (ref 135–145)

## 2020-07-15 LAB — RESP PANEL BY RT-PCR (FLU A&B, COVID) ARPGX2
Influenza A by PCR: NEGATIVE
Influenza B by PCR: NEGATIVE
SARS Coronavirus 2 by RT PCR: NEGATIVE

## 2020-07-15 LAB — CBC
HCT: 37.7 % (ref 36.0–46.0)
Hemoglobin: 10.9 g/dL — ABNORMAL LOW (ref 12.0–15.0)
MCH: 18.4 pg — ABNORMAL LOW (ref 26.0–34.0)
MCHC: 28.9 g/dL — ABNORMAL LOW (ref 30.0–36.0)
MCV: 63.5 fL — ABNORMAL LOW (ref 80.0–100.0)
Platelets: 455 10*3/uL — ABNORMAL HIGH (ref 150–400)
RBC: 5.94 MIL/uL — ABNORMAL HIGH (ref 3.87–5.11)
RDW: 23.9 % — ABNORMAL HIGH (ref 11.5–15.5)
WBC: 11.3 10*3/uL — ABNORMAL HIGH (ref 4.0–10.5)
nRBC: 0 % (ref 0.0–0.2)

## 2020-07-15 MED ORDER — THIAMINE HCL 100 MG PO TABS
100.0000 mg | ORAL_TABLET | Freq: Every day | ORAL | Status: DC
  Administered 2020-07-15 – 2020-07-25 (×11): 100 mg via ORAL
  Filled 2020-07-15 (×11): qty 1

## 2020-07-15 MED ORDER — DULOXETINE HCL 30 MG PO CPEP
30.0000 mg | ORAL_CAPSULE | Freq: Every day | ORAL | Status: DC
  Administered 2020-07-16 – 2020-07-25 (×10): 30 mg via ORAL
  Filled 2020-07-15 (×10): qty 1

## 2020-07-15 MED ORDER — AMLODIPINE BESYLATE 5 MG PO TABS
5.0000 mg | ORAL_TABLET | Freq: Every day | ORAL | Status: DC
  Administered 2020-07-15 – 2020-07-25 (×11): 5 mg via ORAL
  Filled 2020-07-15 (×11): qty 1

## 2020-07-15 MED ORDER — FOLIC ACID 1 MG PO TABS
1.0000 mg | ORAL_TABLET | Freq: Every day | ORAL | Status: DC
  Administered 2020-07-15 – 2020-07-25 (×11): 1 mg via ORAL
  Filled 2020-07-15 (×11): qty 1

## 2020-07-15 MED ORDER — ACETAMINOPHEN 500 MG PO TABS
ORAL_TABLET | ORAL | Status: AC
  Administered 2020-07-15: 21:00:00 1000 mg via ORAL
  Filled 2020-07-15: qty 2

## 2020-07-15 MED ORDER — FERROUS SULFATE 325 (65 FE) MG PO TABS
325.0000 mg | ORAL_TABLET | Freq: Every day | ORAL | Status: DC
  Administered 2020-07-16 – 2020-07-25 (×10): 325 mg via ORAL
  Filled 2020-07-15 (×10): qty 1

## 2020-07-15 MED ORDER — ACETAMINOPHEN 500 MG PO TABS
1000.0000 mg | ORAL_TABLET | Freq: Once | ORAL | Status: AC
  Administered 2020-07-15: 15:00:00 1000 mg via ORAL
  Filled 2020-07-15: qty 2

## 2020-07-15 MED ORDER — ACETAMINOPHEN 500 MG PO TABS: 1000.0000 mg | ORAL_TABLET | Freq: Once | ORAL | Status: AC
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
## 2020-07-15 NOTE — ED Notes (Signed)
Pt given meal tray and cup of ice at this time per request. Pt denies further needs at this time.

## 2020-07-15 NOTE — ED Notes (Signed)
Pt placed in SWA. Pt unable to stand to move into a recliner. Pt incontinent of urine. Pt is c/o a lot of pain and yells when she is touched. Candise Bowens, first RN made aware.
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
## 2020-07-15 NOTE — ED Notes (Signed)
Pt moved to hospital bed at this time. Pt given clean linens, clean brief and chux, external catheter put in place at this time.   Pt given warm blankets and remote at this time.
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
## 2020-07-15 NOTE — Progress Notes (Signed)
PT Cancellation Note  Patient Details Name: BRONNIE VASSEUR MRN: [id number] DOB: May 26, 1971   Cancelled Treatment:    Reason Eval/Treat Not Completed: Fatigue/lethargy limiting ability to participate;Patient's level of consciousness (Chart reviewed, evaluation attempted. Pt heavily asleep, unable to wake in a meaningful way.) Pt opens eye to greeting, but continues to snore without deviation. Pt does not respond to questions.   Pt familiar to author from prior admissions this year. Per baseline deficits, pt is appropriate for LTC setting as she typically is unable to mobilize self in [facility] without assistance. Pt has been resistance to LTC placement in the past. Will continue to follow and evaluate for any STR needs for patient to return to baseline level of independence.    Dujuan Stankowski C 07/15/2020, 3:27 PM
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
## 2020-07-16 DIAGNOSIS — G8192 Hemiplegia, unspecified affecting left dominant side: Secondary | ICD-10-CM | POA: Diagnosis not present

## 2020-07-16 LAB — URINALYSIS, COMPLETE (UACMP) WITH MICROSCOPIC
Bilirubin Urine: NEGATIVE
Glucose, UA: NEGATIVE mg/dL
Ketones, ur: NEGATIVE mg/dL
Leukocytes,Ua: NEGATIVE
Nitrite: NEGATIVE
Protein, ur: 30 mg/dL — AB
RBC / HPF: >50 RBC/hpf — ABNORMAL HIGH (ref 0–5)
Specific Gravity, Urine: 1.012 (ref 1.005–1.030)
pH: 7 (ref 5.0–8.0)

## 2020-07-16 MED ORDER — ACETAMINOPHEN 325 MG PO TABS
650.0000 mg | ORAL_TABLET | ORAL | Status: DC | PRN
  Administered 2020-07-16 – 2020-07-25 (×17): 650 mg via ORAL
  Filled 2020-07-16 (×18): qty 2

## 2020-07-16 MED ORDER — ACETAMINOPHEN 500 MG PO TABS
1000.0000 mg | ORAL_TABLET | Freq: Once | ORAL | Status: AC
  Administered 2020-07-16: 03:00:00 1000 mg via ORAL
  Filled 2020-07-16: qty 2
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
## 2020-07-16 NOTE — TOC Initial Note (Signed)
Transition of Care [facility]) - Initial/Assessment Note    Patient Details  Name: Victoria Gross MRN: [id number] Date of Birth: 01-27-1971  Transition of [facility]) CM/SW Contact:    Toughkenamon Cellar, RN Phone Number: 07/16/2020, 5:17 PM  Clinical Narrative:                 Spoke to patient at bedside in regards to dc plan. Patient states she has been living with her friend Ms. Elijah Birk however she needs more assistance and is wanting to go to LTC facility. Patient states she would like to stay home with [facility] services but understands that takes a long time to set up. Patient reports she has a case worker in the community who assists her by the name of Daivd Council @ [phone]. Patient reports she is incontinent of bowel and bladder and wears briefs daily. Is w/c bound at baseline. Patient confirmed she has had her COVID vaccines including booster shot. Prefers [place] area if possible but open to any location for placement.   Expected Discharge Plan: Long Term Nursing Home Barriers to Discharge: No Barriers Identified   Patient Goals and CMS Choice Patient states their goals for this hospitalization and ongoing recovery are:: get set up with long term care placement CMS Medicare.gov Compare Post Acute Care list provided to:: Patient Choice offered to / list presented to : Patient  Expected Discharge Plan and Services Expected Discharge Plan: Long Term Nursing Home In-house Referral: Clinical Social Work Discharge Planning Services: CM Consult Post Acute Care Choice: Durable Medical Equipment Living arrangements for the past 2 months: Single Family Home                                      Prior Living Arrangements/Services Living arrangements for the past 2 months: Single Family Home Lives with:: Friends Patient language and need for interpreter reviewed:: Yes Do you feel safe going back to the place where you live?: No   Requesting placement into LTC facility.  Need  for Family Participation in Patient Care: No (Comment) Care giver support system in place?: Yes (comment) Current home services: DME (electric wheelchair) Criminal Activity/Legal Involvement Pertinent to Current Situation/Hospitalization: No - Comment as needed  Activities of Daily Living      Permission Sought/Granted Permission sought to share information with : Case Manager,Facility Contact Representative Permission granted to share information with : Yes, Verbal Permission Granted              Emotional Assessment Appearance:: Disheveled,Appears older than stated age Attitude/Demeanor/Rapport: Engaged,Ambitious Affect (typically observed): Accepting Orientation: : Oriented to Self,Oriented to Place,Oriented to  Time,Oriented to Situation Alcohol / Substance Use: Tobacco Use,Alcohol Use (Smokes and drinks ETOH-last ETOH was 2 weeks ago) Psych Involvement: No (comment)  Admission diagnosis:  L leg swelling EMS Patient Active Problem List   Diagnosis Date Noted  . Cocaine abuse with cocaine-induced mood disorder (HCC) 04/16/2020  . Polysubstance abuse (HCC)   . Leg edema, left 03/17/2020  . Prolonged QT interval 03/17/2020  . COVID-19 virus infection 03/16/2020  . Iron deficiency anemia 01/23/2020  . Iron deficiency anemia due to chronic blood loss   . Anemia due to vitamin B12 deficiency   . Acute metabolic encephalopathy   . Ovarian mass   . Acute lower UTI 01/22/2020  . Obesity, Class III, BMI 40-49.9 (morbid obesity) (HCC) 01/22/2020  . UTI (urinary tract  infection) 01/22/2020  . Lymphedema 11/02/2019  . GERD (gastroesophageal reflux disease) 11/02/2019  . Essential hypertension 11/02/2019  . Cellulitis 04/01/2019  . Seizure disorder (HCC)   . Prosthetic eye globe   . Anxiety   . ETOH abuse   . Elevated liver enzymes   . Hepatitis C 10/18/2013  . Weakness of left side of body 07/27/1997  . Blindness of left eye 07/27/1997   PCP:  [facility]:   [facility] - [place], [place] - [address] [place] [address] Phone: [phone] Fax: [phone]  MEDICAL [address] [place], [place] - [address] [place] [address] Phone: [phone] Fax: [phone]     Social Determinants of Health (SDOH) Interventions    Readmission Risk Interventions No flowsheet data found.

## 2020-07-16 NOTE — TOC Progression Note (Signed)
Transition of Care [facility]) - Progression Note    Patient Details  Name: Victoria Gross MRN: [id number] Date of Birth: [birth year]  Transition of Care [facility]) CM/SW Contact  Martin Cellar, RN Phone Number: 07/16/2020, 5:21 PM  Clinical Narrative:     Call to [facility] Peer support @ [phone]. Confirmed patient is living with Ms. Ledell Peoples in her home. Marcelino Duster reports patient had aide services for several hours a day in her home and they just stopped however patient is not aware of who the company is or what happened to the services.writer will contact Medicaid and try to find out. Patient would prefer to return home with aide services instead of LTC as she only wants placement if she can get intense rehab. Marcelino Duster confirmed patient is on wait list for [facility] as well.    Expected Discharge Plan: Long Term Nursing Home Barriers to Discharge: No Barriers Identified  Expected Discharge Plan and Services Expected Discharge Plan: Long Term Nursing Home In-house Referral: Clinical Social Work Discharge Planning Services: CM Consult Post Acute Care Choice: Durable Medical Equipment Living arrangements for the past 2 months: Single Family Home                                       Social Determinants of Health (SDOH) Interventions    Readmission Risk Interventions No flowsheet data found.

## 2020-07-16 NOTE — ED Notes (Signed)
Pt pressed call bell again requesting her sister's phone number which we do not have. Explained to pt that she needed to press the call bell for emergencies only and that I have other pt I have to take care of and that she cannot keep calling and that we would round on her every hour or so. Pt verbalized understanding

## 2020-07-16 NOTE — ED Notes (Signed)
Pt has repeatedly been pressing the call light . This tech has assisted the pt with her needs. This tech explained that breakfast has not got here and we will be bringing her tray as soon as it gets here. Snacks were provided.

## 2020-07-16 NOTE — ED Notes (Signed)
Hourly rounding complete.  Pt resting in hospital bed at this time, watching tv.  Pt has no needs at this time, but does request that her sister be called w/updates.  Pt pending SW placement.

## 2020-07-16 NOTE — TOC Progression Note (Signed)
Transition of Care [facility]) - Progression Note    Patient Details  Name: Victoria Gross MRN: [id number] Date of Birth: [birth year]  Transition of Care [facility]) CM/SW [place], LCSW Phone Number: 07/16/2020, 4:18 PM  Clinical Narrative:    CSW and RNCM met with patient at bedside to discuss discharge plan. Patient is now agreeing to SNF placement/LTC. CSW explained the process and patient gave verbal consent for CSW to send out patient information for bed offers.         Expected Discharge Plan and Services                                                 Social Determinants of Health (SDOH) Interventions    Readmission Risk Interventions No flowsheet data found.

## 2020-07-16 NOTE — ED Notes (Signed)
Placed ice pack on pt knee for pain.

## 2020-07-16 NOTE — ED Notes (Signed)
Called Daivd Council per patient request to notify her that patient was here in room 25.  Explained to Marcelino Duster to come to ER entrance, obtain a visitor's pass and ask to be shown where patient's room is.  Marcelino Duster verbalized understanding. Advised patient that her family was contacted.

## 2020-07-16 NOTE — ED Provider Notes (Signed)
Emergency Medicine Observation Re-evaluation Note  Victoria Gross is a 49 y.o. female, seen on rounds today.  Pt initially presented to the ED for complaints of Leg Pain and Social Work Currently, the patient is resting comfortably, currently reports that she understands plan to have physical therapy see her and hopefully get placed to a additional care setting such as nursing facility.  Physical Exam  BP 130/78 (BP Location: Right Arm)   Pulse (!) 107   Temp 98.3 F (36.8 C) (Oral)   Resp 18   Ht 5' (1.524 m)   Wt 90.7 kg   SpO2 98%   BMI 39.06 kg/m  Physical Exam General: She is alert and well oriented Cardiac: Well-perfused Lungs: Speaks in full and clear sentences Psych: Calm and appropriate.   Neuro: Noted left-sided hemiparesis which is chronic  ED Course / MDM  EKG:    I have reviewed the labs performed to date as well as medications administered while in observation.  Recent changes in the last 24 hours include recommendation for physical therapy consult from social work team.  Plan  Current plan is for awaiting placement options and physical therapy consult.    Sharyn Creamer, MD 07/16/20 [phone]

## 2020-07-16 NOTE — ED Notes (Signed)
Ordered pt's lunch meal tray at this time. Switched order so pt is able to order meals from dietary at this time.

## 2020-07-16 NOTE — ED Notes (Signed)
Breakfast tray given to pt at this time.  

## 2020-07-17 ENCOUNTER — Encounter: Payer: Medicare Other | Admitting: Physical Therapy

## 2020-07-17 DIAGNOSIS — G8192 Hemiplegia, unspecified affecting left dominant side: Secondary | ICD-10-CM | POA: Diagnosis not present

## 2020-07-17 LAB — URINE CULTURE

## 2020-07-17 NOTE — Progress Notes (Signed)
PT Cancellation Note  Patient Details Name: Victoria Gross MRN: [id number] DOB: Oct 14, 1970   Cancelled Treatment:    Reason Eval/Treat Not Completed:  (Order received for PT evaluation. Pt already evaluated previous day, please reference note.)   Lyly Canizales C 07/17/2020, 7:56 AM
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
## 2020-07-17 NOTE — ED Notes (Signed)
Patient's dinner at bedside. Patient was not happy with the dinner. No further complaints at this time.

## 2020-07-17 NOTE — ED Notes (Signed)
Patient's brief and Purewick was changed, chux was changed. Patient felt she needed to have a BM, but declined a bedpan. Patient was instructed to call if needed. Patient was repositioned on bed.

## 2020-07-17 NOTE — TOC Progression Note (Signed)
Transition of Care [facility]) - Progression Note    Patient Details  Name: Victoria Gross MRN: [id number] Date of Birth: 1971/05/27  Transition of Care [facility] CM/SW Contact  Eilleen Kempf, LCSW Phone Number: 07/17/2020, 11:53 AM  Clinical Narrative:    OT was able to evaluate patient and recommends SNF placement. CSW completed workup and has sent out for offers.   Expected Discharge Plan: Long Term Nursing Home Barriers to Discharge: No Barriers Identified  Expected Discharge Plan and Services Expected Discharge Plan: Long Term Nursing Home In-house Referral: Clinical Social Work Discharge Planning Services: CM Consult Post Acute Care Choice: Durable Medical Equipment Living arrangements for the past 2 months: Single Family Home                                       Social Determinants of Health (SDOH) Interventions    Readmission Risk Interventions No flowsheet data found.

## 2020-07-17 NOTE — ED Notes (Signed)
Ice water provided at pts request.  Pt resting in bed.  Will let us know if any needs arise

## 2020-07-17 NOTE — ED Notes (Signed)
Patient is talking on the phone to dietary. NAD.

## 2020-07-17 NOTE — NC FL2 (Signed)
[place] MEDICAID FL2 LEVEL OF CARE SCREENING TOOL     IDENTIFICATION  Patient Name: Victoria Gross Birthdate: 04/17/1971 Sex: female Admission Date (Current Location): 07/15/2020  County and Medicaid Number:  [place]   Facility and Address:         Provider Number:    Attending Physician Name and Address:  No att. providers found  Relative Name and Phone Number:       Current Level of Care: Hospital Recommended Level of Care: Skilled Nursing Facility Prior Approval Number:    Date Approved/Denied:   PASRR Number: Pending  Discharge Plan: SNF    Current Diagnoses: Patient Active Problem List   Diagnosis Date Noted  . Cocaine abuse with cocaine-induced mood disorder (HCC) 04/16/2020  . Polysubstance abuse (HCC)   . Leg edema, left 03/17/2020  . Prolonged QT interval 03/17/2020  . COVID-19 virus infection 03/16/2020  . Iron deficiency anemia 01/23/2020  . Iron deficiency anemia due to chronic blood loss   . Anemia due to vitamin B12 deficiency   . Acute metabolic encephalopathy   . Ovarian mass   . Acute lower UTI 01/22/2020  . Obesity, Class III, BMI 40-49.9 (morbid obesity) (HCC) 01/22/2020  . UTI (urinary tract infection) 01/22/2020  . Lymphedema 11/02/2019  . GERD (gastroesophageal reflux disease) 11/02/2019  . Essential hypertension 11/02/2019  . Cellulitis 04/01/2019  . Seizure disorder (HCC)   . Prosthetic eye globe   . Anxiety   . ETOH abuse   . Elevated liver enzymes   . Hepatitis C 10/18/2013  . Weakness of left side of body 07/27/1997  . Blindness of left eye 07/27/1997    Orientation RESPIRATION BLADDER Height & Weight     Self,Time,Place    Incontinent Weight: 200 lb (90.7 kg) Height:  5' (152.4 cm)  BEHAVIORAL SYMPTOMS/MOOD NEUROLOGICAL BOWEL NUTRITION STATUS      Incontinent    AMBULATORY STATUS COMMUNICATION OF NEEDS Skin   Extensive Assist (Patient currently wheelchair bound) Verbally                         Personal  Care Assistance Level of Assistance  Bathing,Feeding,Dressing,Total care Bathing Assistance: Limited assistance Feeding assistance: Independent Dressing Assistance: Limited assistance Total Care Assistance: Maximum assistance   Functional Limitations Info  Sight Sight Info: Impaired        SPECIAL CARE FACTORS FREQUENCY  PT (By licensed PT),OT (By licensed OT)     PT Frequency: 5 x weekly OT Frequency: 5 x weekly            Contractures Contractures Info: Present (Left foot)    Additional Factors Info  Code Status,Allergies Code Status Info: Full Allergies Info: prozac, tomato, aspirin           Current Medications (07/17/2020):  This is the current hospital active medication list Current Facility-Administered Medications  Medication Dose Route Frequency Provider Last Rate Last Admin  . acetaminophen (TYLENOL) tablet 650 mg  650 mg Oral Q4H PRN Bradler, Evan K, MD   650 mg at 07/17/20 0758  . amLODipine (NORVASC) tablet 5 mg  5 mg Oral Daily Jessup, Charles, MD   5 mg at 07/17/20 1012  . DULoxetine (CYMBALTA) DR capsule 30 mg  30 mg Oral Daily Jessup, Charles, MD   30 mg at 07/17/20 1013  . ferrous sulfate tablet 325 mg  325 mg Oral Q breakfast Jessup, Charles, MD   325 mg at 07/17/20 0759  . folic acid (  FOLVITE) tablet 1 mg  1 mg Oral Daily Chesley Noon, MD   1 mg at 07/17/20 1012  . thiamine tablet 100 mg  100 mg Oral Daily Chesley Noon, MD   100 mg at 07/17/20 1013   Current Outpatient Medications  Medication Sig Dispense Refill  . albuterol (VENTOLIN HFA) 108 (90 Base) MCG/ACT inhaler Inhale into the lungs every 6 (six) hours as needed for wheezing or shortness of breath.    Marland Kitchen amLODipine (NORVASC) 5 MG tablet Take 5 mg by mouth daily. (Patient not taking: Reported on 07/15/2020)    . DULoxetine (CYMBALTA) 30 MG capsule Take 30 mg by mouth daily. (Patient not taking: Reported on 07/15/2020)    . fluticasone (FLONASE) 50 MCG/ACT nasal spray Place 1 spray into  both nostrils daily as needed.    . medroxyPROGESTERone (DEPO-PROVERA) 150 MG/ML injection Inject 150 mg into the muscle every 3 (three) months.    . methocarbamol (ROBAXIN) 500 MG tablet Take 1 tablet (500 mg total) by mouth every 8 (eight) hours as needed for muscle spasms. 20 tablet 0  . neomycin-polymyxin b-dexamethasone (MAXITROL) 3.5-10000-0.1 SUSP Place 2 drops into the left eye 2 (two) times daily as needed (eye drainage).    Marland Kitchen PROAIR HFA 108 (90 Base) MCG/ACT inhaler Inhale 2 puffs into the lungs every 6 (six) hours as needed.        Discharge Medications: Please see discharge summary for a list of discharge medications.  Relevant Imaging Results:  Relevant Lab Results:   Additional Information SS# [id number]  Eilleen Kempf, LCSW

## 2020-07-17 NOTE — ED Notes (Signed)
Patient called out asking where her lunch was at. Patient was informed that dietary has not delivered lunch yet.

## 2020-07-17 NOTE — NC FL2 (Signed)
[place] MEDICAID FL2 LEVEL OF CARE SCREENING TOOL     IDENTIFICATION  Patient Name: Victoria Gross Birthdate: 09/20/1970 Sex: female Admission Date (Current Location): 07/15/2020  [facility] [place] Number:  Chiropodist and Address:         Provider Number:    Attending Physician Name and Address:  No att. providers found  Relative Name and Phone Number:       Current Level of Care: Hospital Recommended Level of Care: Skilled Nursing Facility Prior Approval Number:    Date Approved/Denied:   PASRR Number: Pending  Discharge Plan: SNF    Current Diagnoses: Patient Active Problem List   Diagnosis Date Noted  . Cocaine abuse with cocaine-induced mood disorder (HCC) 04/16/2020  . Polysubstance abuse (HCC)   . Leg edema, left 03/17/2020  . Prolonged QT interval 03/17/2020  . COVID-19 virus infection 03/16/2020  . Iron deficiency anemia 01/23/2020  . Iron deficiency anemia due to chronic blood loss   . Anemia due to vitamin B12 deficiency   . Acute metabolic encephalopathy   . Ovarian mass   . Acute lower UTI 01/22/2020  . Obesity, Class III, BMI 40-49.9 (morbid obesity) (HCC) 01/22/2020  . UTI (urinary tract infection) 01/22/2020  . Lymphedema 11/02/2019  . GERD (gastroesophageal reflux disease) 11/02/2019  . Essential hypertension 11/02/2019  . Cellulitis 04/01/2019  . Seizure disorder (HCC)   . Prosthetic eye globe   . Anxiety   . ETOH abuse   . Elevated liver enzymes   . Hepatitis C 10/18/2013  . Weakness of left side of body 07/27/1997  . Blindness of left eye 07/27/1997    Orientation RESPIRATION BLADDER Height & Weight     Self,Time,Place    Incontinent Weight: 200 lb (90.7 kg) Height:  5' (152.4 cm)  BEHAVIORAL SYMPTOMS/MOOD NEUROLOGICAL BOWEL NUTRITION STATUS      Incontinent    AMBULATORY STATUS COMMUNICATION OF NEEDS Skin   Extensive Assist (Patient currently wheelchair bound) Verbally                         Personal  Care Assistance Level of Assistance  Bathing,Feeding,Dressing,Total care Bathing Assistance: Limited assistance Feeding assistance: Independent Dressing Assistance: Limited assistance Total Care Assistance: Maximum assistance   Functional Limitations Info  Sight Sight Info: Impaired        SPECIAL CARE FACTORS FREQUENCY  PT (By licensed PT),OT (By licensed OT)     PT Frequency: 5 x weekly OT Frequency: 5 x weekly            Contractures Contractures Info: Present (Left foot)    Additional Factors Info  Code Status,Allergies Code Status Info: Full Allergies Info: prozac, tomato, aspirin           Current Medications (07/17/2020):  This is the current hospital active medication list Current Facility-Administered Medications  Medication Dose Route Frequency Provider Last Rate Last Admin  . acetaminophen (TYLENOL) tablet 650 mg  650 mg Oral Q4H PRN Merwyn Katos, MD   650 mg at 07/17/20 0758  . amLODipine (NORVASC) tablet 5 mg  5 mg Oral Daily Chesley Noon, MD   5 mg at 07/17/20 1012  . DULoxetine (CYMBALTA) DR capsule 30 mg  30 mg Oral Daily Chesley Noon, MD   30 mg at 07/17/20 1013  . ferrous sulfate tablet 325 mg  325 mg Oral Q breakfast Chesley Noon, MD   325 mg at 07/17/20 0759  . folic acid (  FOLVITE) tablet 1 mg  1 mg Oral Daily Chesley Noon, MD   1 mg at 07/17/20 1012  . thiamine tablet 100 mg  100 mg Oral Daily Chesley Noon, MD   100 mg at 07/17/20 1013   Current Outpatient Medications  Medication Sig Dispense Refill  . albuterol (VENTOLIN HFA) 108 (90 Base) MCG/ACT inhaler Inhale into the lungs every 6 (six) hours as needed for wheezing or shortness of breath.    Marland Kitchen amLODipine (NORVASC) 5 MG tablet Take 5 mg by mouth daily. (Patient not taking: Reported on 07/15/2020)    . DULoxetine (CYMBALTA) 30 MG capsule Take 30 mg by mouth daily. (Patient not taking: Reported on 07/15/2020)    . fluticasone (FLONASE) 50 MCG/ACT nasal spray Place 1 spray into  both nostrils daily as needed.    . medroxyPROGESTERone (DEPO-PROVERA) 150 MG/ML injection Inject 150 mg into the muscle every 3 (three) months.    . methocarbamol (ROBAXIN) 500 MG tablet Take 1 tablet (500 mg total) by mouth every 8 (eight) hours as needed for muscle spasms. 20 tablet 0  . neomycin-polymyxin b-dexamethasone (MAXITROL) 3.5-10000-0.1 SUSP Place 2 drops into the left eye 2 (two) times daily as needed (eye drainage).    Marland Kitchen PROAIR HFA 108 (90 Base) MCG/ACT inhaler Inhale 2 puffs into the lungs every 6 (six) hours as needed.        Discharge Medications: Please see discharge summary for a list of discharge medications.  Relevant Imaging Results:  Relevant Lab Results:   Additional Information SS# [id number]  Eilleen Kempf, LCSW

## 2020-07-17 NOTE — ED Notes (Signed)
Pt given breakfast tray

## 2020-07-17 NOTE — ED Notes (Signed)
Report given to Noah RN

## 2020-07-17 NOTE — ED Notes (Signed)
Pt resting in bed at this time pt requested lights to be turned off.  Lights turned off at this time.  NAD noted.

## 2020-07-17 NOTE — [place] FL2 (Signed)
[place] MEDICAID FL2 LEVEL OF CARE SCREENING TOOL     IDENTIFICATION  Patient Name: Victoria Gross Birthdate: Dec 21, 1970 Sex: female Admission Date (Current Location): 07/15/2020  [place] and [place] Number:  Chiropodist and Address:  [facility], [address] [place], [place] [address]      Provider Number:    Attending Physician Name and Address:  No att. providers found  Relative Name and Phone Number:       Current Level of Care: Hospital Recommended Level of Care: Skilled Nursing Facility Prior Approval Number:    Date Approved/Denied:   PASRR Number: Pending  Discharge Plan: SNF    Current Diagnoses: Patient Active Problem List   Diagnosis Date Noted  . Cocaine abuse with cocaine-induced mood disorder (HCC) 04/16/2020  . Polysubstance abuse (HCC)   . Leg edema, left 03/17/2020  . Prolonged QT interval 03/17/2020  . COVID-19 virus infection 03/16/2020  . Iron deficiency anemia 01/23/2020  . Iron deficiency anemia due to chronic blood loss   . Anemia due to vitamin B12 deficiency   . Acute metabolic encephalopathy   . Ovarian mass   . Acute lower UTI 01/22/2020  . Obesity, Class III, BMI 40-49.9 (morbid obesity) (HCC) 01/22/2020  . UTI (urinary tract infection) 01/22/2020  . Lymphedema 11/02/2019  . GERD (gastroesophageal reflux disease) 11/02/2019  . Essential hypertension 11/02/2019  . Cellulitis 04/01/2019  . Seizure disorder (HCC)   . Prosthetic eye globe   . Anxiety   . ETOH abuse   . Elevated liver enzymes   . Hepatitis C 10/18/2013  . Weakness of left side of body 07/27/1997  . Blindness of left eye 07/27/1997    Orientation RESPIRATION BLADDER Height & Weight     Self,Time,Place    Incontinent Weight: 200 lb (90.7 kg) Height:  5' (152.4 cm)  BEHAVIORAL SYMPTOMS/MOOD NEUROLOGICAL BOWEL NUTRITION STATUS      Incontinent    AMBULATORY STATUS COMMUNICATION OF NEEDS Skin   Extensive Assist (Patient  currently wheelchair bound) Verbally                         Personal Care Assistance Level of Assistance  Bathing,Feeding,Dressing,Total care Bathing Assistance: Limited assistance Feeding assistance: Independent Dressing Assistance: Limited assistance Total Care Assistance: Maximum assistance   Functional Limitations Info  Sight Sight Info: Impaired        SPECIAL CARE FACTORS FREQUENCY  PT (By licensed PT),OT (By licensed OT)     PT Frequency: 5 x weekly OT Frequency: 5 x weekly            Contractures Contractures Info: Present (Left foot)    Additional Factors Info  Code Status,Allergies Code Status Info: Full Allergies Info: prozac, tomato, aspirin           Current Medications (07/17/2020):  This is the current hospital active medication list Current Facility-Administered Medications  Medication Dose Route Frequency Provider Last Rate Last Admin  . acetaminophen (TYLENOL) tablet 650 mg  650 mg Oral Q4H PRN Merwyn Katos, MD   650 mg at 07/17/20 0758  . amLODipine (NORVASC) tablet 5 mg  5 mg Oral Daily Chesley Noon, MD   5 mg at 07/17/20 1012  . DULoxetine (CYMBALTA) DR capsule 30 mg  30 mg Oral Daily Chesley Noon, MD   30 mg at 07/17/20 1013  . ferrous sulfate tablet 325 mg  325 mg Oral Q breakfast Chesley Noon, MD  325 mg at 07/17/20 0759  . folic acid (FOLVITE) tablet 1 mg  1 mg Oral Daily Chesley Noon, MD   1 mg at 07/17/20 1012  . thiamine tablet 100 mg  100 mg Oral Daily Chesley Noon, MD   100 mg at 07/17/20 1013   Current Outpatient Medications  Medication Sig Dispense Refill  . albuterol (VENTOLIN HFA) 108 (90 Base) MCG/ACT inhaler Inhale into the lungs every 6 (six) hours as needed for wheezing or shortness of breath.    Marland Kitchen amLODipine (NORVASC) 5 MG tablet Take 5 mg by mouth daily. (Patient not taking: Reported on 07/15/2020)    . DULoxetine (CYMBALTA) 30 MG capsule Take 30 mg by mouth daily. (Patient not taking: Reported on  07/15/2020)    . fluticasone (FLONASE) 50 MCG/ACT nasal spray Place 1 spray into both nostrils daily as needed.    . medroxyPROGESTERone (DEPO-PROVERA) 150 MG/ML injection Inject 150 mg into the muscle every 3 (three) months.    . methocarbamol (ROBAXIN) 500 MG tablet Take 1 tablet (500 mg total) by mouth every 8 (eight) hours as needed for muscle spasms. 20 tablet 0  . neomycin-polymyxin b-dexamethasone (MAXITROL) 3.5-10000-0.1 SUSP Place 2 drops into the left eye 2 (two) times daily as needed (eye drainage).    Marland Kitchen PROAIR HFA 108 (90 Base) MCG/ACT inhaler Inhale 2 puffs into the lungs every 6 (six) hours as needed.        Discharge Medications: Please see discharge summary for a list of discharge medications.  Relevant Imaging Results:  Relevant Lab Results:   Additional Information SS# [id number]  Eilleen Kempf, LCSW

## 2020-07-17 NOTE — ED Notes (Addendum)
Pt given clean socks and phone to make call to family.  Pt denies any other needs currently.

## 2020-07-18 DIAGNOSIS — G8192 Hemiplegia, unspecified affecting left dominant side: Secondary | ICD-10-CM | POA: Diagnosis not present

## 2020-07-18 MED ORDER — TRAZODONE HCL 50 MG PO TABS
50.0000 mg | ORAL_TABLET | Freq: Every day | ORAL | Status: DC
  Administered 2020-07-18 – 2020-07-24 (×7): 50 mg via ORAL
  Filled 2020-07-18 (×8): qty 1

## 2020-07-18 NOTE — TOC Progression Note (Signed)
Transition of Care [facility]) - Progression Note    Patient Details  Name: Victoria Gross MRN: [id number] Date of Birth: 1970-07-31  Transition of Care [facility]) CM/SW Contact  Stratford Cellar, RN Phone Number: 07/18/2020, 9:51 AM  Clinical Narrative:    Arnaldo Natal to [facility] DSS APS regarding potential fraud as patient reports getting aide services and then services stopped but she does not know why. Michelle-community CM concerned patient is victim of fraudulent activity. Patient would like to return to Mrs. Caldwells home if she had PCS services again but if not she will need to go to LTC facility under Medicaid services. Anticipating difficult placement due to drug history however will continue to pursue SNF/LTC as well as resuming PCS services at her friends home.  DSS is closed but paged APS worker on call for follow up.     Expected Discharge Plan: Long Term Nursing Home Barriers to Discharge: No Barriers Identified  Expected Discharge Plan and Services Expected Discharge Plan: Long Term Nursing Home In-house Referral: Clinical Social Work Discharge Planning Services: CM Consult Post Acute Care Choice: Durable Medical Equipment Living arrangements for the past 2 months: Single Family Home                                       Social Determinants of Health (SDOH) Interventions    Readmission Risk Interventions No flowsheet data found.

## 2020-07-18 NOTE — ED Notes (Signed)
Patient pulled up in bed by this RN and Social worker. Patient only wanted chips from finger foods tray. Hot meal tray ordered and verified with dining services it will be coming up with all hot tray meals

## 2020-07-18 NOTE — ED Notes (Signed)
Purewick changed with assistance of [place], [place]. Pt provided tylenol   Given cup of water, brief changed with one unmeasured BM  Continued period bleeding

## 2020-07-18 NOTE — ED Notes (Signed)
Patients brief checked. Brief dry and clean at this time. purwick in place

## 2020-07-18 NOTE — ED Provider Notes (Signed)
Emergency Medicine Observation Re-evaluation Note  Victoria Gross is a 49 y.o. female, seen on rounds today.  Pt initially presented to the ED for complaints of Leg Pain and Social Work Currently, the patient is resting comfortably.  Physical Exam  BP 111/77   Pulse (!) 52   Temp 98.5 F (36.9 C)   Resp 18   Ht 5' (1.524 m)   Wt 90.7 kg   SpO2 96%   BMI 39.06 kg/m  Physical Exam General: resting comfortably Cardiac: regular rate and rhythm Lungs: normal rate. No increased respiratory effort Psych: calm  ED Course / MDM  EKG:    I have reviewed the labs performed to date as well as medications administered while in observation.  Recent changes in the last 24 hours include none.  Plan  Current plan is for long term nursing home placement. Patient is not under full IVC at this time.   Phineas Semen, MD 07/18/20 2250

## 2020-07-18 NOTE — ED Notes (Addendum)
Pt suction cath leaked resulting in soiled brief and bed linen. Pt cleaned, bed linen changed, and purewick replaced at this time. Urine in canister noted to be red, when pt questioned about urine color pt reported being on her menstrual cycle. Blood noted when assisting pt with wiping. Small pressure sore noted on pt left gluteal. Pressure dressing not available in ED at this time. Supply chain contacted to obtain appropriate dressing.

## 2020-07-18 NOTE — ED Notes (Signed)
Pt given lunch tray.

## 2020-07-19 DIAGNOSIS — G8192 Hemiplegia, unspecified affecting left dominant side: Secondary | ICD-10-CM | POA: Diagnosis not present

## 2020-07-19 MED ORDER — MELATONIN 5 MG PO TABS
5.0000 mg | ORAL_TABLET | Freq: Every day | ORAL | Status: DC
  Administered 2020-07-19 – 2020-07-24 (×6): 5 mg via ORAL
  Filled 2020-07-19 (×6): qty 1

## 2020-07-19 MED ORDER — ARTIFICIAL TEARS OPHTHALMIC OINT
TOPICAL_OINTMENT | OPHTHALMIC | Status: DC | PRN
  Filled 2020-07-19 (×2): qty 3.5

## 2020-07-19 MED ORDER — NAPROXEN 500 MG PO TABS
500.0000 mg | ORAL_TABLET | Freq: Once | ORAL | Status: AC
  Administered 2020-07-19: 10:00:00 500 mg via ORAL
  Filled 2020-07-19: qty 1

## 2020-07-19 NOTE — ED Provider Notes (Signed)
Stop into check with patient she is awake and alert.  Reports she is having a little bit of trouble with her sleep cycle feels like she wants to be awake during the night and awake during the day.  She normally takes melatonin at home in the evenings, have ordered this now.  She also reports that she has had dryness in her eyes, will order Lacri-Lube, her left eye she reports is a flase eye   Sharyn Creamer, MD 07/19/20 1020

## 2020-07-19 NOTE — ED Provider Notes (Signed)
Patient reports aching in her left lower leg, reports that she would typically take Aleve for this.  Have ordered Naprosyn   Sharyn Creamer, MD 07/19/20 1011

## 2020-07-19 NOTE — ED Notes (Signed)
PT resting in bed. Equal and unlabored breathing noted. Pt woke upon RN entering room and reports a headache.

## 2020-07-19 NOTE — TOC Progression Note (Signed)
Transition of Care [facility]) - Progression Note    Patient Details  Name: Victoria Gross MRN: [id number] Date of Birth: [birth year]  Transition of Care [facility]) CM/SW Contact  Marina Goodell Phone Number: [phone] 07/19/2020, 1:30 PM  Clinical Narrative:    CSW sent FL2 and CSW referral to additional SNF facilities for possible placement.   Expected Discharge Plan: Long Term Nursing Home Barriers to Discharge: No Barriers Identified  Expected Discharge Plan and Services Expected Discharge Plan: Long Term Nursing Home In-house Referral: Clinical Social Work Discharge Planning Services: CM Consult Post Acute Care Choice: Durable Medical Equipment Living arrangements for the past 2 months: Single Family Home                                       Social Determinants of Health (SDOH) Interventions    Readmission Risk Interventions No flowsheet data found.

## 2020-07-19 NOTE — ED Notes (Signed)
Pt resting in bed. NAD noted.

## 2020-07-19 NOTE — ED Provider Notes (Signed)
-----------------------------------------   6:28 AM on 07/19/2020 -----------------------------------------   Blood pressure 111/77, pulse (!) 52, temperature 98.5 F (36.9 C), resp. rate 18, height 5' (1.524 m), weight 90.7 kg, SpO2 96 %.  The patient is calm and cooperative at this time.  There have been no acute events since the last update.  Awaiting disposition plan from Social Work team(s).   Irean Hong, MD 07/19/20 [phone]

## 2020-07-19 NOTE — ED Notes (Addendum)
Patient readjusted in bed by this RN and Farley Ly. Purewick changed and suction canister emptied by Fleet Contras RN at this time.  Dinner tray on table within patient reach. Call bell within reach. Patient using phone at this time. Denies further needs.

## 2020-07-20 DIAGNOSIS — G8192 Hemiplegia, unspecified affecting left dominant side: Secondary | ICD-10-CM | POA: Diagnosis not present

## 2020-07-20 NOTE — ED Notes (Signed)
VS obtained by this RN. This RN assisted patient with breakfast tray at this time. Pt denies further needs at this time.

## 2020-07-20 NOTE — ED Notes (Signed)
Pt visualized in NAD, resting in bed with lights dimmed, pt denies any needs. Pt currently watching TV.  Call bell within reach.

## 2020-07-20 NOTE — ED Notes (Signed)
Medications administered per order at this time. NAD noted. Pt continues to rest in bed watching TV. Lights dimmed per patient request at this time.

## 2020-07-20 NOTE — ED Notes (Signed)
Pt visualized resting in bed with NAD noted at this time. Pt alert and oriented, watching TV at this time. Pt denies further needs. Call bell remains within reach of patient. Pt states decreased pain at this time.

## 2020-07-20 NOTE — ED Notes (Signed)
This RN to bedside, introduced self to patient. Pt resting in bed, call bell within reach, fall alarm on hospital bed. Lights dimmed for patient comfort. Pt c/o continued pain to legs at this time. This RN explained would review medications and return, pt states understanding at this time.

## 2020-07-20 NOTE — ED Notes (Signed)
Pt repositioned in bed at this time, dim light turned on per patient request. Pt placed order for breakfast and dinner at this time.

## 2020-07-20 NOTE — ED Notes (Signed)
Medication administered as ordered. This RN called in patient's breakfast order per request of patient.

## 2020-07-20 NOTE — ED Notes (Signed)
Pt resting in bed with NAD noted at this time. Lights dimmed for patient comfort, respirations even and unlabored. Call bell remains within reach of patient at this time.

## 2020-07-20 NOTE — ED Notes (Signed)
This RN and Shawna Orleans, EDT assisted patient with bath/pericare. Pt noted to be on her menstrual cycle. New Purewick applied by this RN. Pt tolerated well. Pt repositioned in bed at this time. Mepilex applied by this RN at this time. Call bell remains within reach of patient, pt denies further needs.

## 2020-07-20 NOTE — ED Notes (Signed)
Pt given meal tray.

## 2020-07-20 NOTE — ED Notes (Signed)
Pt repositioned in bed at this time, pt denies further needs. Pt continues to rest in bed watching TV, c/o continued pain. Call bell remains within reach of patient

## 2020-07-20 NOTE — ED Notes (Signed)
Pt c/o increasing pain to her L leg, medication administered per order. Pt repositioned in bed, lights dimmed for comfort, pt states she is going to try to get a nap.

## 2020-07-20 NOTE — ED Notes (Signed)
Patient readjusted in bed. purewick in place.

## 2020-07-20 NOTE — ED Notes (Signed)
Pt visualized in NAD, resting in bed, watching TV and eating lunch tray. Pt denies further needs at this time. Call bell within reach of patient at this time.

## 2020-07-20 NOTE — ED Notes (Signed)
Pt given phone to speak with [employer].

## 2020-07-20 NOTE — ED Notes (Signed)
This RN and [place], EDT to bedside, pt repositioned in bed at this time, pt's leg repositioned in bed for comfort. Pt states feels much better at this time. Pt denies further needs.

## 2020-07-20 NOTE — ED Provider Notes (Signed)
-----------------------------------------   6:11 AM on 07/20/2020 -----------------------------------------   Blood pressure 110/77, pulse 78, temperature 99.1 F (37.3 C), temperature source Oral, resp. rate 18, height 5' (1.524 m), weight 90.7 kg, SpO2 97 %.  The patient is calm and cooperative at this time.  There have been no acute events since the last update.  Awaiting disposition plan from Social Work team.   Irean Hong, MD 07/20/20 [phone]

## 2020-07-21 DIAGNOSIS — G8192 Hemiplegia, unspecified affecting left dominant side: Secondary | ICD-10-CM | POA: Diagnosis not present

## 2020-07-21 MED ORDER — POLYETHYLENE GLYCOL 3350 17 G PO PACK
17.0000 g | PACK | Freq: Every day | ORAL | Status: DC
  Administered 2020-07-21 – 2020-07-24 (×4): 17 g via ORAL
  Filled 2020-07-21 (×5): qty 1

## 2020-07-21 MED ORDER — NAPROXEN 500 MG PO TABS
500.0000 mg | ORAL_TABLET | Freq: Once | ORAL | Status: AC
  Administered 2020-07-21: 19:00:00 500 mg via ORAL
  Filled 2020-07-21: qty 1

## 2020-07-21 NOTE — ED Notes (Signed)
Patient requesting alieve or different pain medication. Patient states tylenol is not helping her headache or leg cramps at this time. EDP notified, new orders placed.

## 2020-07-21 NOTE — ED Notes (Signed)
Patient pad, diaper, and purewick changed.

## 2020-07-21 NOTE — ED Provider Notes (Signed)
Emergency Medicine Observation Re-evaluation Note  Victoria Gross is a 49 y.o. female, seen on rounds today.  Pt initially presented to the ED for complaints of Leg Pain and Social Work Currently, the patient is watching television.  Physical Exam  BP 112/74 (BP Location: Right Arm)   Pulse 75   Temp 99 F (37.2 C) (Oral)   Resp 18   Ht 5' (1.524 m)   Wt 90.7 kg   SpO2 99%   BMI 39.06 kg/m  Physical Exam Constitutional:      Appearance: She is not ill-appearing or toxic-appearing.  HENT:     Head: Atraumatic.  Eyes:     Extraocular Movements: Extraocular movements intact.     Pupils: Pupils are equal, round, and reactive to light.  Pulmonary:     Effort: Pulmonary effort is normal.  Abdominal:     General: There is no distension.  Musculoskeletal:        General: No signs of injury.  Skin:    General: Skin is warm and dry.  Neurological:     General: No focal deficit present.     Cranial Nerves: No cranial nerve deficit.      ED Course / MDM  EKG:    I have reviewed the labs performed to date as well as medications administered while in observation.  Recent changes in the last 24 hours include none.  Plan  Current plan is for voluntary placement due to inability to care for herself at home. Patient is not under full IVC at this time.   Delton Prairie, MD 07/21/20 [phone]

## 2020-07-21 NOTE — TOC Progression Note (Signed)
Transition of Care [facility]) - Progression Note    Patient Details  Name: Victoria Gross MRN: [id number] Date of Birth: [birth year]  Transition of Care [facility]) CM/SW Contact  Larwance Rote, LCSW Phone Number: 07/21/2020, 5:38 PM  Clinical Narrative:   SNF placement. Waiting for bed offers. No bed offers as of 07/21/2020 1639.    Expected Discharge Plan: Long Term Nursing Home Barriers to Discharge: No Barriers Identified  Expected Discharge Plan and Services Expected Discharge Plan: Long Term Nursing Home In-house Referral: Clinical Social Work Discharge Planning Services: CM Consult Post Acute Care Choice: Durable Medical Equipment Living arrangements for the past 2 months: Single Family Home                                       Social Determinants of Health (SDOH) Interventions    Readmission Risk Interventions No flowsheet data found.

## 2020-07-21 NOTE — ED Provider Notes (Signed)
-----------------------------------------   6:49 AM on 07/21/2020 -----------------------------------------   Blood pressure 120/67, pulse 79, temperature 99.3 F (37.4 C), temperature source Oral, resp. rate 18, height 1.524 m (5'), weight 90.7 kg, SpO2 98 %.  The patient is calm and cooperative at this time.  There have been no acute events since the last update.  Awaiting disposition plan from  Social Work team(s).   Loleta Rose, MD 07/21/20 [phone]

## 2020-07-21 NOTE — ED Notes (Signed)
Patient had purewick malfunction.  Patient diaper changed and cleaned.  New purewick placed and working at this time.

## 2020-07-22 ENCOUNTER — Encounter: Payer: Medicare Other | Admitting: Physical Therapy

## 2020-07-22 DIAGNOSIS — L8990 Pressure ulcer of unspecified site, unspecified stage: Secondary | ICD-10-CM | POA: Insufficient documentation

## 2020-07-22 DIAGNOSIS — G8192 Hemiplegia, unspecified affecting left dominant side: Secondary | ICD-10-CM | POA: Diagnosis not present

## 2020-07-22 MED ORDER — DIPHENHYDRAMINE HCL 25 MG PO CAPS
25.0000 mg | ORAL_CAPSULE | Freq: Once | ORAL | Status: AC
  Administered 2020-07-22: 13:00:00 25 mg via ORAL
  Filled 2020-07-22: qty 1

## 2020-07-22 MED ORDER — SENNA 8.6 MG PO TABS
2.0000 | ORAL_TABLET | Freq: Once | ORAL | Status: AC
  Administered 2020-07-22: 10:00:00 17.2 mg via ORAL
  Filled 2020-07-22: qty 2

## 2020-07-22 NOTE — ED Notes (Signed)
Patient placed on right side.

## 2020-07-22 NOTE — ED Notes (Signed)
Pt cleaned up and bed linens adjusted by this RN and Caitlyn, NT. New brief and purewick applied. Pillow behind pt's right buttock to relieve pressure on bedsore. Pt denies any further needs.

## 2020-07-22 NOTE — ED Provider Notes (Signed)
Emergency Medicine Observation Re-evaluation Note  Victoria Gross is a 49 y.o. female, seen on rounds today.  Pt initially presented to the ED for complaints of Leg Pain and Social Work   Physical Exam  BP 110/64 (BP Location: Right Arm)    Pulse 84    Temp 98.8 F (37.1 C) (Oral)    Resp 17    Ht 5' (1.524 m)    Wt 90.7 kg    SpO2 98%    BMI 39.06 kg/m  Physical Exam General: Patient resting comfortably in bed Lungs: Patient in no respiratory distress Psych: Patient not agitated  ED Course / MDM  EKG:      Plan  Patient is voluntary awaiting placement.  Patient does have a pressure ulcer around her sacrum.  Nurse reports she is very resistant to turning and positioning although she has been trying to do that.  We will try to order a air better air mattress for her.  She is already on the hospital bed.   Arnaldo Natal, MD 07/22/20 [phone]

## 2020-07-22 NOTE — ED Notes (Signed)
Patient visualized sleeping in hospital bed. NAD noted. Call bell within reach.

## 2020-07-22 NOTE — ED Notes (Addendum)
Patient has a bed sore on R leg. Patient educated to rotate and readjust q2 hours. Patient wants to be left alone at night and not bothered. Air bed ordered for patient to automatically rotate patient.

## 2020-07-22 NOTE — ED Notes (Signed)
Patient readjusted in bed. Pillows placed to put patient on left side and to put feet and legs up. Patient states she is more comfortable and has less pressure on her back.  Purewick and pad changed, barrier cream applied to patients bottom.

## 2020-07-22 NOTE — TOC Progression Note (Signed)
Transition of Care [facility]) - Progression Note    Patient Details  Name: Victoria Gross MRN: [id number] Date of Birth: 1970/11/13  [facility]) CM/SW Contact  Marina Goodell Phone Number:  [phone] 07/22/2020, 2:10 PM  Clinical Narrative:     Patient has placement acceptance to [facility] SN[facility].  CSW called and left voicemail for Debbie [phone] or [phone] to confirm bed availability.  CSW called and left voicemail for patient's daughter [name] [phone], but was unable to leave voicemail due to full mailbox.  Expected Discharge Plan: Long Term Nursing Home Barriers to Discharge: No Barriers Identified  Expected Discharge Plan and Services Expected Discharge Plan: Long Term Nursing Home In-house Referral: Clinical Social Work Discharge Planning Services: CM Consult Post Acute Care Choice: Durable Medical Equipment Living arrangements for the past 2 months: Single Family Home                                       Social Determinants of Health (SDOH) Interventions    Readmission Risk Interventions No flowsheet data found.

## 2020-07-22 NOTE — ED Notes (Signed)
Pt placed on specialty mattress with the help of Jonny Ruiz, RN, Pattricia Boss, RN, and [place], [place]. Pt denies any further needs.

## 2020-07-23 DIAGNOSIS — G8192 Hemiplegia, unspecified affecting left dominant side: Secondary | ICD-10-CM | POA: Diagnosis not present

## 2020-07-23 MED ORDER — METHOCARBAMOL 500 MG PO TABS
500.0000 mg | ORAL_TABLET | Freq: Once | ORAL | Status: AC
  Administered 2020-07-24: 01:00:00 500 mg via ORAL
  Filled 2020-07-23: qty 1

## 2020-07-23 MED ORDER — CYCLOBENZAPRINE HCL 10 MG PO TABS
10.0000 mg | ORAL_TABLET | Freq: Once | ORAL | Status: AC
  Administered 2020-07-23: 11:00:00 10 mg via ORAL
  Filled 2020-07-23: qty 1

## 2020-07-23 MED ORDER — GERHARDT'S BUTT CREAM
TOPICAL_CREAM | Freq: Two times a day (BID) | CUTANEOUS | Status: DC
  Filled 2020-07-23: qty 1

## 2020-07-23 NOTE — ED Notes (Signed)
Pt eating breakfast. Hand hygiene provided proir to eating.
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
## 2020-07-23 NOTE — ED Notes (Signed)
Pt awake, given phone to order breakfast per request
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
## 2020-07-23 NOTE — TOC Progression Note (Addendum)
Transition of Care [facility]) - Progression Note    Patient Details  Name: KARIZMA CHEEK MRN: [id number] Date of Birth: [birth year]  Transition of Care [facility]) CM/SW Contact  Marina Goodell Phone Number: [phone] 07/23/2020, 2:42 PM  Clinical Narrative:     CSW spoke with patient to update her on placement process.  CSW requested new PT/OT evaluations for insurance authorization.  PT/OT confirmed they will evaluate the patient as soon as possible.  CSW explained current placement process to patient and gave her am estimated timeline for insurance authorization.  Patient verbalized understanding.  Patient asked this CSW to contact [employer] (Other) [phone], and update her on placement process. CSW spoke with Ms. Manson Passey and updated her on insurance wuth process and placement timeline.  Ms. Manson Passey inquired about a denial from the insurance company.  CSW explained then I would switch gears and try to find the patient a handicap accessible group home.  Ms. Manson Passey requested this CSW call her with an update once I found out group home.    CSW called and spoke with Eunice Blase from [facility] with update on insurance auth and pending P/OT notes.  Expected Discharge Plan: Long Term Nursing Home Barriers to Discharge: No Barriers Identified  Expected Discharge Plan and Services Expected Discharge Plan: Long Term Nursing Home In-house Referral: Clinical Social Work Discharge Planning Services: CM Consult Post Acute Care Choice: Durable Medical Equipment Living arrangements for the past 2 months: Single Family Home                                       Social Determinants of Health (SDOH) Interventions    Readmission Risk Interventions No flowsheet data found.

## 2020-07-23 NOTE — ED Notes (Signed)
Pt ate 100% of breakfast.

## 2020-07-23 NOTE — ED Notes (Signed)
Offered PRN tylenol, pt declines.

## 2020-07-23 NOTE — ED Notes (Signed)
Pts sheets changed and patient bathed and lotion applied , ed rn x 2 in room , pt given ice and drink and brief changed

## 2020-07-23 NOTE — ED Notes (Signed)
Pt assisted to bedside commode from chair; did well with pivot to commode and back to chair.

## 2020-07-23 NOTE — ED Provider Notes (Signed)
-----------------------------------------   6:54 AM on 07/23/2020 -----------------------------------------   Blood pressure 121/74, pulse 85, temperature 98.1 F (36.7 C), temperature source Oral, resp. rate 18, height 1.524 m (5'), weight 90.7 kg, SpO2 98 %.  The patient is calm and cooperative at this time.  There have been no acute events since the last update.  Awaiting disposition plan from  Social Work team(s).   Loleta Rose, MD 07/23/20 [phone]

## 2020-07-23 NOTE — ED Notes (Signed)
Pt states coming in with left knee pain. Pt states she came in with swelling, but it has gone down significantly. Pt resting in chair and states she will let RN know when ready to get into bed. Pts left arm noted to be contracted.

## 2020-07-23 NOTE — ED Notes (Signed)
Pt assisted to bedside recliner chair by OT/PT

## 2020-07-23 NOTE — Consult Note (Signed)
WOC Nurse Consult Note: Reason for Consult: Moisture associated skin damage to buttocks and posterior thighs. Prolonged time up in chair and inability to care for self at home. Patient is obese and occasionally incontinent.  Wound type:moisture and pressure  Inability to care for self.  Incontinence associated damage (IAD) to skin.  Pressure Injury POA: Yes pressure and moisture.  Partial thickness.  Measurement:scattered nonintact lesions to buttocks in upper gluteal fold and posterior upper thighs, consistent with moisture associated skin damage  Wound NKN:LZJQ and moist Drainage (amount, consistency, odor) minimal serosanguinous  No odor Periwound:intact Dressing procedure/placement/frequency: Cleanse buttocks/sacral and posterior thigh breakdown with soap and water and pat dry.  Apply Gerhardts butt paste twice daily and PRN soilage.   Will not follow at this time.  Please re-consult if needed.  Maple Hudson MSN, RN, FNP-BC CWON Wound, Ostomy, Continence Nurse Pager [phone]

## 2020-07-24 ENCOUNTER — Encounter: Payer: Medicare Other | Admitting: Physical Therapy

## 2020-07-24 DIAGNOSIS — G8192 Hemiplegia, unspecified affecting left dominant side: Secondary | ICD-10-CM | POA: Diagnosis not present

## 2020-07-24 MED ORDER — METHOCARBAMOL 500 MG PO TABS
500.0000 mg | ORAL_TABLET | Freq: Two times a day (BID) | ORAL | Status: DC
  Administered 2020-07-24 – 2020-07-25 (×3): 500 mg via ORAL
  Filled 2020-07-24 (×4): qty 1

## 2020-07-24 NOTE — ED Notes (Signed)
Per pt request, EDP consulted regarding methocarbamol. Verbal orders received, verified by read back. Order placed

## 2020-07-24 NOTE — TOC Progression Note (Signed)
Transition of Care [facility]) - Progression Note    Patient Details  Name: Victoria Gross MRN: [id number] Date of Birth: 06-Nov-1970  [facility]) CM/SW Contact  Welby Cellar, RN Phone Number: 07/24/2020, 2:26 PM  Clinical Narrative:    LVMM for [facility] Program Manager @ [phone] trying to get clarification on what happened to patients PCS services. Patient is hopeful to return home if she has PCS services restarted. Will continue to follow for further clarification.    Expected Discharge Plan: Long Term Nursing Home Barriers to Discharge: No Barriers Identified  Expected Discharge Plan and Services Expected Discharge Plan: Long Term Nursing Home In-house Referral: Clinical Social Work Discharge Planning Services: CM Consult Post Acute Care Choice: Durable Medical Equipment Living arrangements for the past 2 months: Single Family Home                                       Social Determinants of Health (SDOH) Interventions    Readmission Risk Interventions No flowsheet data found.

## 2020-07-24 NOTE — ED Notes (Signed)
Report received from [place], RN at this time. Pt resting in chair in room on telephone. Pt denies any needs at this time.

## 2020-07-24 NOTE — ED Notes (Signed)
Pt assisted to bed by this nurse and Raquel, RN. Pt received clean brief and replacement purewick at this time. Pt able to pivot with two person assist. PT has air bed in place and turned on at this time. Resting comfortably now

## 2020-07-24 NOTE — ED Provider Notes (Signed)
Emergency Medicine Observation Re-evaluation Note  Victoria Gross is a 49 y.o. female, seen on rounds today.  Pt initially presented to the ED for complaints of Leg Pain and Social Work   Physical Exam  BP 121/64 (BP Location: Right Wrist)   Pulse 72   Temp 97.9 F (36.6 C) (Oral)   Resp 18   Ht 5' (1.524 m)   Wt 90.7 kg   SpO2 96%   BMI 39.06 kg/m  Physical Exam General: Patient resting comfortably in bed Lungs: Patient in no respiratory distress Psych: Patient not agitated  ED Course / MDM  EKG:  Patient reports methocarbamol helped her pain.  We will try 1 twice a day for the time being.  Plan  Patient awaiting social work placement.   Arnaldo Natal, MD 07/24/20 1319

## 2020-07-24 NOTE — ED Notes (Signed)
Pt reports that she takes all her night time meds at 11:15PM. Requests robaxin at this time as well. Denies any needs at this time.

## 2020-07-24 NOTE — ED Notes (Signed)
RN, with assistance got pt into bed. Pt had new pure wick and depends placed. Lights dimmed for comfort.

## 2020-07-24 NOTE — ED Notes (Signed)
Pt repositioned in bed, given ginger ale and phone to call cafeteria per request. Call bell on bed rail.   Pt states she wants more of the methocarbamol she received at midnight. Pt states it "helped her a lot" and she would like a recurring order for it

## 2020-07-24 NOTE — TOC Progression Note (Signed)
Transition of Care [facility]) - Progression Note    Patient Details  Name: CODY OLIGER MRN: [id number] Date of Birth: [birth year]  Transition of Care [facility]) CM/SW [place], [place] Phone Number: [phone] 07/24/2020, 2:07 PM  Clinical Narrative:     Patient met with [place] [employer] from [place] for possible placement.  Patient was not offered placement due to care needs at the moment.  Plan is for patient to go to SNF and then transfer to LTC or group home.   CSW started insurance auth via [employer] website REF# [id number]. Placement pending ins auth.  Expected Discharge Plan: Long Term Nursing Home Barriers to Discharge: No Barriers Identified  Expected Discharge Plan and Services Expected Discharge Plan: Long Term Nursing Home In-house Referral: Clinical Social Work Discharge Planning Services: CM Consult Post Acute Care Choice: Durable Medical Equipment Living arrangements for the past 2 months: Single Family Home                                       Social Determinants of Health (SDOH) Interventions    Readmission Risk Interventions No flowsheet data found.

## 2020-07-24 NOTE — ED Notes (Signed)
Pt has several times been provided with drinks, meal trays provided, trash removed, pt repositioned in recliner. Purewick changed by NT per request

## 2020-07-25 DIAGNOSIS — G8192 Hemiplegia, unspecified affecting left dominant side: Secondary | ICD-10-CM | POA: Diagnosis not present

## 2020-07-25 NOTE — ED Notes (Signed)
[facility]  CALLED  FOR  TRANSPORT  HOME 

## 2020-07-25 NOTE — ED Notes (Signed)
This nurse and Dede Query assisted pt with change of position to relieve pressure on pt buttock. Pt is rolled to right side and pillow placed under to aid with positioning. PT expresses improvements of positioning at this time.

## 2020-07-25 NOTE — TOC Progression Note (Addendum)
Transition of Care [facility]) - Progression Note    Patient Details  Name: Victoria Gross MRN: [id number] Date of Birth: [birth year]  Transition of Care [facility]) CM/SW Contact  Marina Goodell Phone Number:  [phone] 07/25/2020, 10:28 AM  Clinical Narrative:     Insurance company requested a peer-to-peer today 12/30, by 1:30PM EST, the number is [phone], press 5. EDP notified.  CSW reached out to Dr. Catha Gosselin for peer-to-peer.  Update: 11:30AM: Insurance Auth denied.  Insurance will not cover SNF placement.  Expected Discharge Plan: Long Term Nursing Home Barriers to Discharge: No Barriers Identified  Expected Discharge Plan and Services Expected Discharge Plan: Long Term Nursing Home In-house Referral: Clinical Social Work Discharge Planning Services: CM Consult Post Acute Care Choice: Durable Medical Equipment Living arrangements for the past 2 months: Single Family Home                                       Social Determinants of Health (SDOH) Interventions    Readmission Risk Interventions No flowsheet data found.

## 2020-07-25 NOTE — ED Provider Notes (Signed)
-----------------------------------------   2:46 PM on 07/25/2020 -----------------------------------------  Patient has been seen and evaluated by social work at her peer to peer conference today does not meet SNF criteria or least does not have the financial means to go to SNF. I have filed a face-to-face form for the patient for home health RN PT and OT. Patient will be discharged home. Patient agreeable to plan of care.   Minna Antis, MD 07/25/20 1447

## 2020-07-25 NOTE — ED Notes (Signed)
PT on call light, requests to be taken up in bed, performed by this RN and Penni Bombard, RN. PT provided with cup of ice on request

## 2020-07-25 NOTE — TOC Progression Note (Signed)
Transition of Care [facility]) - Progression Note    Patient Details  Name: Victoria Gross MRN: [id number] Date of Birth: Dec 30, 1970  Transition of Care [facility]) CM/SW Contact  Victoria Gross Phone Number: [phone] 07/25/2020, 1:33 PM  Clinical Narrative:     CSW spoke with Erie Noe from [facility] Assistance [phone] to confirm patient had existing PCS services through [facility].  Erie Noe stated the patient had continued services and her services had not been discontinued due to the pandemic.  Erie Noe gave this CSW the contact number for Lethea Killings, PCS Aid [phone] to confirm she would continue PCS services for patient.  Ms. Willa Rough stated she would continue services and the patient would need to contact Ms. Hicks when she arrived home.   CSW contacted Ms. Charmian Muff [phone] to confirm patient can return to the house.  Ms. Elijah Birk stated patient can return home.   CSW contacted [facility] for PT, OT and RN services and received confirmation they would be able to take patient.   Expected Discharge Plan: Long Term Nursing Home Barriers to Discharge: No Barriers Identified  Expected Discharge Plan and Services Expected Discharge Plan: Long Term Nursing Home In-house Referral: Clinical Social Work Discharge Planning Services: CM Consult Post Acute Care Choice: Durable Medical Equipment Living arrangements for the past 2 months: Single Family Home                                       Social Determinants of Health (SDOH) Interventions    Readmission Risk Interventions No flowsheet data found.
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
## 2020-07-25 NOTE — ED Notes (Signed)
Pt on call bell due to complaints of how she was positioned in bed due to is being air bed for relief of pressure. PT educated on function and reason for bed and pt understands. HOB adjusted per pt request as pt had raised hob and feet as hight as they could go. Pt comfortable at this time, will continue to monitor.

## 2020-07-25 NOTE — ED Notes (Signed)
Pt has deficit on Left side of body. Pt is WDL of cardiac and respiratory. Has wound on bottom per chart and pt but she did not allow this nurse to assess due to not wanting to move from seated position in recliner that she was in when this nurse takes over pt care.

## 2020-07-25 NOTE — ED Notes (Signed)
Pt awake and hits call light. Pt requests to move to recliner to aid in comfort and position change. Pt moved at this time, requires one person assist with success. PT provided with ginger ale to drink and blanket, denies any needs at this time, call light in reach.

## 2020-07-25 NOTE — TOC Transition Note (Signed)
Transition of Care [facility]) - CM/SW Discharge Note   Patient Details  Name: Victoria Gross MRN: [id number] Date of Birth: 07-02-1971  Transition of Care [facility]) CM/SW Contact:  Joseph Art, LCSWA Phone Number: 07/25/2020, 2:39 PM   Clinical Narrative:     Patient will d/c back home with home health services PT,OT,RN from [facility] and PCS from [facility] Lethea Killings [phone].  CSW confirmed with Charmian Muff patient can return to the home.  [facility] will transport via EMS.  EP/ED Staff notified. TOC consult compete.     Barriers to Discharge: No Barriers Identified   Patient Goals and CMS Choice Patient states their goals for this hospitalization and ongoing recovery are:: get set up with long term care placement CMS Medicare.gov Compare Post Acute Care list provided to:: Patient Choice offered to / list presented to : Patient  Discharge Placement                       Discharge Plan and Services In-house Referral: Clinical Social Work Discharge Planning Services: CM Consult Post Acute Care Choice: Durable Medical Equipment                               Social Determinants of Health (SDOH) Interventions     Readmission Risk Interventions No flowsheet data found.

## 2020-08-30 ENCOUNTER — Other Ambulatory Visit: Payer: Self-pay | Admitting: Internal Medicine

## 2020-08-30 DIAGNOSIS — N83209 Unspecified ovarian cyst, unspecified side: Secondary | ICD-10-CM

## 2020-09-04 ENCOUNTER — Other Ambulatory Visit: Payer: Self-pay

## 2020-09-04 ENCOUNTER — Other Ambulatory Visit: Payer: Self-pay | Admitting: Internal Medicine

## 2020-09-04 ENCOUNTER — Ambulatory Visit
Admission: RE | Admit: 2020-09-04 | Discharge: 2020-09-04 | Disposition: A | Discharge status: Home / Self Care | Payer: Medicare Other | Source: Ambulatory Visit | Attending: Internal Medicine | Admitting: Internal Medicine

## 2020-09-04 DIAGNOSIS — N83209 Unspecified ovarian cyst, unspecified side: Secondary | ICD-10-CM | POA: Insufficient documentation

## 2020-11-04 ENCOUNTER — Other Ambulatory Visit: Payer: Self-pay

## 2020-11-04 ENCOUNTER — Encounter: Payer: Self-pay | Admitting: Emergency Medicine

## 2020-11-04 ENCOUNTER — Emergency Department
Admission: EM | Admit: 2020-11-04 | Discharge: 2020-11-04 | Disposition: A | Discharge status: Home / Self Care | Payer: Medicare Other | Attending: Emergency Medicine | Admitting: Emergency Medicine

## 2020-11-04 DIAGNOSIS — F17210 Nicotine dependence, cigarettes, uncomplicated: Secondary | ICD-10-CM | POA: Diagnosis not present

## 2020-11-04 DIAGNOSIS — I890 Lymphedema, not elsewhere classified: Secondary | ICD-10-CM | POA: Insufficient documentation

## 2020-11-04 DIAGNOSIS — I10 Essential (primary) hypertension: Secondary | ICD-10-CM | POA: Insufficient documentation

## 2020-11-04 DIAGNOSIS — Z8616 Personal history of COVID-19: Secondary | ICD-10-CM | POA: Insufficient documentation

## 2020-11-04 DIAGNOSIS — M7989 Other specified soft tissue disorders: Secondary | ICD-10-CM | POA: Diagnosis present

## 2020-11-04 DIAGNOSIS — M79605 Pain in left leg: Secondary | ICD-10-CM

## 2020-11-04 MED ORDER — OXYCODONE-ACETAMINOPHEN 5-325 MG PO TABS
1.0000 | ORAL_TABLET | Freq: Once | ORAL | Status: AC
  Administered 2020-11-04: 1 via ORAL
  Filled 2020-11-04: qty 1

## 2020-11-04 MED ORDER — OXYCODONE-ACETAMINOPHEN 5-325 MG PO TABS: 1.0000 | ORAL_TABLET | Freq: Three times a day (TID) | ORAL | 0 refills | Status: DC | PRN
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
## 2020-11-04 NOTE — ED Triage Notes (Signed)
Pt to ED via [facility] with c/o chronic leg pain, UTI smell, and having thoughts about wanting to hurt herself. Pt denies active plan at this time. Pt also c/o HA at this time. Pt A&O x4. NAD noted at this time.

## 2020-11-04 NOTE — ED Notes (Signed)
Per EDP PO medications and patient medically clear for D/C.

## 2020-11-04 NOTE — ED Notes (Signed)
Pt placed on bed pan at this time, pt unable to have a bedpan, clean brief placed on patient at this time. Call bell remains within reach of patient. Explained will call EMS to take patient home.

## 2020-11-04 NOTE — ED Notes (Signed)
EDP at bedside to assess patient.  

## 2020-11-04 NOTE — ED Notes (Signed)
PT continues to await transport to home.  Will continue to monitor.

## 2020-12-11 ENCOUNTER — Other Ambulatory Visit: Payer: Self-pay

## 2020-12-11 ENCOUNTER — Ambulatory Visit: Payer: Medicare Other | Attending: Internal Medicine | Admitting: Occupational Therapy

## 2020-12-11 ENCOUNTER — Encounter: Payer: Self-pay | Admitting: Occupational Therapy

## 2020-12-11 DIAGNOSIS — I890 Lymphedema, not elsewhere classified: Secondary | ICD-10-CM | POA: Diagnosis present

## 2020-12-12 ENCOUNTER — Encounter: Payer: Self-pay | Admitting: Occupational Therapy

## 2020-12-12 NOTE — Patient Instructions (Signed)
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
## 2020-12-13 ENCOUNTER — Ambulatory Visit: Payer: Medicare Other | Admitting: Occupational Therapy

## 2020-12-18 ENCOUNTER — Ambulatory Visit: Payer: Medicare Other | Admitting: Occupational Therapy

## 2020-12-20 ENCOUNTER — Encounter: Payer: Medicare Other | Admitting: Occupational Therapy

## 2020-12-25 ENCOUNTER — Encounter: Payer: Medicare Other | Admitting: Occupational Therapy

## 2020-12-27 ENCOUNTER — Encounter: Payer: Medicare Other | Admitting: Occupational Therapy

## 2021-01-01 ENCOUNTER — Encounter: Payer: Medicare Other | Admitting: Occupational Therapy

## 2021-01-02 ENCOUNTER — Ambulatory Visit: Payer: Medicare Other | Admitting: Occupational Therapy

## 2021-01-03 ENCOUNTER — Encounter: Payer: Medicare Other | Admitting: Occupational Therapy

## 2021-01-08 ENCOUNTER — Encounter: Payer: Medicare Other | Admitting: Occupational Therapy

## 2021-01-10 ENCOUNTER — Encounter: Payer: Medicare Other | Admitting: Occupational Therapy

## 2021-01-13 ENCOUNTER — Ambulatory Visit: Payer: Medicare Other | Admitting: Occupational Therapy

## 2021-01-15 ENCOUNTER — Encounter: Payer: Medicare Other | Admitting: Occupational Therapy

## 2021-01-16 ENCOUNTER — Encounter: Payer: Medicare Other | Admitting: Occupational Therapy

## 2021-01-17 ENCOUNTER — Ambulatory Visit: Payer: Medicare Other | Admitting: Occupational Therapy

## 2021-01-17 ENCOUNTER — Encounter: Payer: Medicare Other | Admitting: Occupational Therapy

## 2021-01-22 ENCOUNTER — Encounter: Payer: Medicare Other | Admitting: Occupational Therapy

## 2021-01-23 ENCOUNTER — Encounter: Payer: Medicare Other | Admitting: Occupational Therapy

## 2021-02-05 ENCOUNTER — Other Ambulatory Visit: Payer: Self-pay

## 2021-02-05 ENCOUNTER — Emergency Department
Admission: EM | Admit: 2021-02-05 | Discharge: 2021-02-10 | Disposition: A | Discharge status: Home / Self Care | Payer: Medicare Other | Attending: Emergency Medicine | Admitting: Emergency Medicine

## 2021-02-05 ENCOUNTER — Emergency Department: Payer: Medicare Other

## 2021-02-05 DIAGNOSIS — R600 Localized edema: Secondary | ICD-10-CM | POA: Insufficient documentation

## 2021-02-05 DIAGNOSIS — I890 Lymphedema, not elsewhere classified: Secondary | ICD-10-CM | POA: Diagnosis not present

## 2021-02-05 DIAGNOSIS — M545 Low back pain, unspecified: Secondary | ICD-10-CM | POA: Insufficient documentation

## 2021-02-05 DIAGNOSIS — I10 Essential (primary) hypertension: Secondary | ICD-10-CM | POA: Diagnosis not present

## 2021-02-05 DIAGNOSIS — Z8616 Personal history of COVID-19: Secondary | ICD-10-CM | POA: Insufficient documentation

## 2021-02-05 DIAGNOSIS — Z79899 Other long term (current) drug therapy: Secondary | ICD-10-CM | POA: Diagnosis not present

## 2021-02-05 DIAGNOSIS — G8929 Other chronic pain: Secondary | ICD-10-CM | POA: Insufficient documentation

## 2021-02-05 DIAGNOSIS — Z20822 Contact with and (suspected) exposure to covid-19: Secondary | ICD-10-CM | POA: Diagnosis not present

## 2021-02-05 DIAGNOSIS — F17210 Nicotine dependence, cigarettes, uncomplicated: Secondary | ICD-10-CM | POA: Insufficient documentation

## 2021-02-05 DIAGNOSIS — M79662 Pain in left lower leg: Secondary | ICD-10-CM | POA: Insufficient documentation

## 2021-02-05 DIAGNOSIS — R531 Weakness: Secondary | ICD-10-CM

## 2021-02-05 DIAGNOSIS — Z789 Other specified health status: Secondary | ICD-10-CM | POA: Diagnosis not present

## 2021-02-05 DIAGNOSIS — Y92009 Unspecified place in unspecified non-institutional (private) residence as the place of occurrence of the external cause: Secondary | ICD-10-CM | POA: Diagnosis not present

## 2021-02-05 DIAGNOSIS — M79605 Pain in left leg: Secondary | ICD-10-CM

## 2021-02-05 LAB — COMPREHENSIVE METABOLIC PANEL
ALT: 12 U/L (ref 0–44)
AST: 20 U/L (ref 15–41)
Albumin: 3.9 g/dL (ref 3.5–5.0)
Alkaline Phosphatase: 77 U/L (ref 38–126)
Anion gap: 13 (ref 5–15)
BUN: 13 mg/dL (ref 6–20)
CO2: 22 mmol/L (ref 22–32)
Calcium: 8.9 mg/dL (ref 8.9–10.3)
Chloride: 101 mmol/L (ref 98–111)
Creatinine, Ser: 0.72 mg/dL (ref 0.44–1.00)
GFR, Estimated: >60 mL/min (ref >60)
Glucose, Bld: 82 mg/dL (ref 70–99)
Potassium: 3.8 mmol/L (ref 3.5–5.1)
Sodium: 136 mmol/L (ref 135–145)
Total Bilirubin: 0.9 mg/dL (ref 0.3–1.2)
Total Protein: 8.1 g/dL (ref 6.5–8.1)

## 2021-02-05 LAB — URINALYSIS, COMPLETE (UACMP) WITH MICROSCOPIC
Bilirubin Urine: NEGATIVE
Glucose, UA: NEGATIVE mg/dL
Ketones, ur: NEGATIVE mg/dL
Leukocytes,Ua: NEGATIVE
Nitrite: NEGATIVE
Protein, ur: NEGATIVE mg/dL
RBC / HPF: >50 RBC/hpf — ABNORMAL HIGH (ref 0–5)
Specific Gravity, Urine: 1.011 (ref 1.005–1.030)
pH: 6 (ref 5.0–8.0)

## 2021-02-05 LAB — CBC WITH DIFFERENTIAL/PLATELET
Abs Immature Granulocytes: 0.03 10*3/uL (ref 0.00–0.07)
Basophils Absolute: 0 10*3/uL (ref 0.0–0.1)
Basophils Relative: 0 %
Eosinophils Absolute: 0.2 10*3/uL (ref 0.0–0.5)
Eosinophils Relative: 2 %
HCT: 36.6 % (ref 36.0–46.0)
Hemoglobin: 11.1 g/dL — ABNORMAL LOW (ref 12.0–15.0)
Immature Granulocytes: 0 %
Lymphocytes Relative: 28 %
Lymphs Abs: 2.4 10*3/uL (ref 0.7–4.0)
MCH: 21.9 pg — ABNORMAL LOW (ref 26.0–34.0)
MCHC: 30.3 g/dL (ref 30.0–36.0)
MCV: 72.2 fL — ABNORMAL LOW (ref 80.0–100.0)
Monocytes Absolute: 0.6 10*3/uL (ref 0.1–1.0)
Monocytes Relative: 6 %
Neutro Abs: 5.5 10*3/uL (ref 1.7–7.7)
Neutrophils Relative %: 64 %
Platelets: 337 10*3/uL (ref 150–400)
RBC: 5.07 MIL/uL (ref 3.87–5.11)
RDW: 18.7 % — ABNORMAL HIGH (ref 11.5–15.5)
WBC: 8.7 10*3/uL (ref 4.0–10.5)
nRBC: 0 % (ref 0.0–0.2)

## 2021-02-05 LAB — RESP PANEL BY RT-PCR (FLU A&B, COVID) ARPGX2
Influenza A by PCR: NEGATIVE
Influenza B by PCR: NEGATIVE
SARS Coronavirus 2 by RT PCR: NEGATIVE

## 2021-02-05 LAB — PREGNANCY, URINE: Preg Test, Ur: NEGATIVE

## 2021-02-05 LAB — BRAIN NATRIURETIC PEPTIDE: B Natriuretic Peptide: 14.7 pg/mL (ref 0.0–100.0)

## 2021-02-05 LAB — LACTIC ACID, PLASMA: Lactic Acid, Venous: 1.6 mmol/L (ref 0.5–1.9)

## 2021-02-05 LAB — POC URINE PREG, ED: Preg Test, Ur: NEGATIVE

## 2021-02-05 MED ORDER — LIDOCAINE 5 % EX PTCH
1.0000 | MEDICATED_PATCH | CUTANEOUS | Status: DC
  Administered 2021-02-05 – 2021-02-10 (×7): 1 via TRANSDERMAL
  Filled 2021-02-05 (×7): qty 1

## 2021-02-05 MED ORDER — AMLODIPINE BESYLATE 5 MG PO TABS
5.0000 mg | ORAL_TABLET | Freq: Every day | ORAL | Status: DC
  Administered 2021-02-05 – 2021-02-10 (×6): 5 mg via ORAL
  Filled 2021-02-05 (×6): qty 1

## 2021-02-05 MED ORDER — ACETAMINOPHEN 500 MG PO TABS
1000.0000 mg | ORAL_TABLET | Freq: Once | ORAL | Status: AC
  Administered 2021-02-05: 1000 mg via ORAL
  Filled 2021-02-05: qty 2

## 2021-02-05 MED ORDER — HYDROCODONE-ACETAMINOPHEN 5-325 MG PO TABS
1.0000 | ORAL_TABLET | Freq: Once | ORAL | Status: AC
  Administered 2021-02-05: 1 via ORAL
  Filled 2021-02-05: qty 1

## 2021-02-05 MED ORDER — ALBUTEROL SULFATE HFA 108 (90 BASE) MCG/ACT IN AERS
2.0000 | INHALATION_SPRAY | Freq: Four times a day (QID) | RESPIRATORY_TRACT | Status: DC | PRN
  Filled 2021-02-05: qty 6.7

## 2021-02-05 MED ORDER — ACETAMINOPHEN 325 MG PO TABS
650.0000 mg | ORAL_TABLET | Freq: Once | ORAL | Status: AC
  Administered 2021-02-05: 650 mg via ORAL
  Filled 2021-02-05: qty 2
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
## 2021-02-05 NOTE — ED Triage Notes (Signed)
Pt comes with c/o generalized body aches since yesterday. Pt denies any N/V/D or belly pain.

## 2021-02-05 NOTE — ED Notes (Signed)
Pt requesting meal tray at this time. Writer provided after confirmation from MD.

## 2021-02-05 NOTE — ED Notes (Signed)
Patient given ginger ale and cup of ice at this time.

## 2021-02-05 NOTE — ED Notes (Signed)
Dr. Sedalia Muta at bedside. Patient does not meet admission criteria at this time per Dr. Sedalia Muta.
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
## 2021-02-06 ENCOUNTER — Emergency Department: Payer: Medicare Other

## 2021-02-06 DIAGNOSIS — M545 Low back pain, unspecified: Secondary | ICD-10-CM | POA: Diagnosis not present

## 2021-02-06 LAB — URINE CULTURE

## 2021-02-06 MED ORDER — LIDOCAINE 5 % EX PTCH
1.0000 | MEDICATED_PATCH | CUTANEOUS | Status: DC
  Administered 2021-02-06 – 2021-02-09 (×5): 1 via TRANSDERMAL
  Filled 2021-02-06 (×5): qty 1

## 2021-02-06 MED ORDER — ACETAMINOPHEN 500 MG PO TABS
1000.0000 mg | ORAL_TABLET | Freq: Once | ORAL | Status: AC
  Administered 2021-02-06: 1000 mg via ORAL
  Filled 2021-02-06: qty 2

## 2021-02-06 NOTE — [place] FL2 (Signed)
[place] MEDICAID FL2 LEVEL OF CARE SCREENING TOOL     IDENTIFICATION  Patient Name: Victoria Gross Birthdate: October 04, 1970 Sex: female Admission Date (Current Location): 02/05/2021  [facility] and [place] Number:  [address] [id number] T Facility and Address:  [facility], [address] [place], [place] [address]      Provider Number: [id number]  Attending Physician Name and Address:  No att. providers found  Relative Name and Phone Number:  Jazzmon, Prindle Daughter     [phone]    Current Level of Care: Hospital Recommended Level of Care: Skilled Nursing Facility Prior Approval Number:    Date Approved/Denied:   PASRR Number:    Discharge Plan: Other (Comment) (ALF)    Current Diagnoses: Patient Active Problem List   Diagnosis Date Noted   Social problem not due to mental disorder 02/05/2021   Pressure injury of skin 07/22/2020   Cocaine abuse with cocaine-induced mood disorder (HCC) 04/16/2020   Polysubstance abuse (HCC)    Leg edema, left 03/17/2020   Prolonged QT interval 03/17/2020   COVID-19 virus infection 03/16/2020   Iron deficiency anemia 01/23/2020   Iron deficiency anemia due to chronic blood loss    Anemia due to vitamin B12 deficiency    Acute metabolic encephalopathy    Ovarian mass    Acute lower UTI 01/22/2020   Obesity, Class III, BMI 40-49.9 (morbid obesity) (HCC) 01/22/2020   UTI (urinary tract infection) 01/22/2020   Lymphedema 11/02/2019   GERD (gastroesophageal reflux disease) 11/02/2019   Essential hypertension 11/02/2019   Cellulitis 04/01/2019   Seizure disorder (HCC)    Prosthetic eye globe    Anxiety    ETOH abuse    Elevated liver enzymes    Hepatitis C 10/18/2013   Weakness of left side of body 07/27/1997   Blindness of left eye 07/27/1997    Orientation RESPIRATION BLADDER Height & Weight     Self, Time, Situation, Place  Normal Incontinent Weight:   Height:     BEHAVIORAL SYMPTOMS/MOOD  NEUROLOGICAL BOWEL NUTRITION STATUS      Incontinent Diet  AMBULATORY STATUS COMMUNICATION OF NEEDS Skin   Extensive Assist Verbally Normal                       Personal Care Assistance Level of Assistance  Bathing, Feeding, Dressing Bathing Assistance: Limited assistance Feeding assistance: Limited assistance Dressing Assistance: Limited assistance     Functional Limitations Info  Sight, Speech, Hearing Sight Info: Adequate Hearing Info: Adequate Speech Info: Adequate    SPECIAL CARE FACTORS FREQUENCY  PT (By licensed PT), OT (By licensed OT)     PT Frequency: 5x week OT Frequency: 5x week            Contractures Contractures Info: Present (Left upper and lower extrimities)    Additional Factors Info  Allergies   Allergies Info: aspirin, Prozac, Tomato           Current Medications (02/06/2021):  This is the current hospital active medication list Current Facility-Administered Medications  Medication Dose Route Frequency Provider Last Rate Last Admin   albuterol (VENTOLIN HFA) 108 (90 Base) MCG/ACT inhaler 2 puff  2 puff Inhalation Q6H PRN Chesley Noon, MD       amLODipine (NORVASC) tablet 5 mg  5 mg Oral Daily Chesley Noon, MD   5 mg at 02/06/21 0910   lidocaine (LIDODERM) 5 % 1 patch  1 patch Transdermal Q24H Lucy Chris, PA   1 patch  at 02/06/21 1429   Current Outpatient Medications  Medication Sig Dispense Refill   acetaminophen (TYLENOL) 500 MG tablet Take 500 mg by mouth every 6 (six) hours as needed.     albuterol (VENTOLIN HFA) 108 (90 Base) MCG/ACT inhaler Inhale into the lungs every 6 (six) hours as needed for wheezing or shortness of breath.     amLODipine (NORVASC) 10 MG tablet Take 10 mg by mouth daily.     baclofen (LIORESAL) 10 MG tablet Take 10 mg by mouth 3 (three) times daily.     clotrimazole (LOTRIMIN) 1 % cream Apply 1 application topically daily.     DULoxetine (CYMBALTA) 60 MG capsule Take 60 mg by mouth daily.      fluticasone (FLONASE) 50 MCG/ACT nasal spray Place 1 spray into both nostrils daily as needed.     SUMAtriptan (IMITREX) 50 MG tablet Take 1 tablet by mouth daily.     traZODone (DESYREL) 50 MG tablet Take 100 mg by mouth at bedtime as needed.     amLODipine (NORVASC) 5 MG tablet Take 5 mg by mouth daily. (Patient not taking: No sig reported)     DULoxetine (CYMBALTA) 30 MG capsule Take 30 mg by mouth daily. (Patient not taking: No sig reported)     medroxyPROGESTERone (DEPO-PROVERA) 150 MG/ML injection Inject 150 mg into the muscle every 3 (three) months.     methocarbamol (ROBAXIN) 500 MG tablet Take 1 tablet (500 mg total) by mouth every 8 (eight) hours as needed for muscle spasms. (Patient not taking: Reported on 02/05/2021) 20 tablet 0   neomycin-polymyxin b-dexamethasone (MAXITROL) 3.5-10000-0.1 SUSP Place 2 drops into the left eye 2 (two) times daily as needed (eye drainage). (Patient not taking: Reported on 02/05/2021)     oxyCODONE-acetaminophen (PERCOCET) 5-325 MG tablet Take 1 tablet by mouth every 8 (eight) hours as needed for severe pain. (Patient not taking: Reported on 02/05/2021) 12 tablet 0     Discharge Medications: Please see discharge summary for a list of discharge medications.  Relevant Imaging Results:  Relevant Lab Results:   Additional Information SS: [id number]  Maud Deed, LCSW

## 2021-02-06 NOTE — TOC Progression Note (Addendum)
Transition of Care [facility]) - Progression Note    Patient Details  Name: Victoria Gross MRN: [id number] Date of Birth: 09-29-1970  [facility]) CM/SW Contact  Maud Deed, LCSW Phone Number: 02/06/2021, 2:01 PM  Clinical Narrative:    CSW completed FL2 and faxed to [facility] as well as emailed FL2 to Diona Fanti with [place] [facility]. CSW started PASRR and faxed additional info.   Expected Discharge Plan: Long Term Nursing Home Barriers to Discharge: ED Bed availability  Expected Discharge Plan and Services Expected Discharge Plan: Long Term Nursing Home                                               Social Determinants of Health (SDOH) Interventions    Readmission Risk Interventions No flowsheet data found.

## 2021-02-06 NOTE — TOC Initial Note (Addendum)
Transition of Care [facility]) - Initial/Assessment Note    Patient Details  Name: Victoria Gross MRN: [id number] Date of Birth: 1971/05/18  Transition of Care [facility]) CM/SW Contact:    Maud Deed, LCSW Phone Number: 02/06/2021, 1:07 PM  Clinical Narrative:                 CSW spoke with pt in regards to discharge plan. Pt refused to return to her previous residence with her roommate and would like placement at a SNF if possible. CSW notified pt that FL2 would be completed and faxed out to facilities.   Expected Discharge Plan: Long Term Nursing Home Barriers to Discharge: ED Bed availability   Patient Goals and CMS Choice Patient states their goals for this hospitalization and ongoing recovery are:: Pt states that she woukd like to go to a long term care facility CMS Medicare.gov Compare Post Acute Care list provided to:: Patient Choice offered to / list presented to : Patient  Expected Discharge Plan and Services Expected Discharge Plan: Long Term Nursing Home                                              Prior Living Arrangements/Services   Lives with:: Roommate Patient language and need for interpreter reviewed:: Yes Do you feel safe going back to the place where you live?: No   Does not want to return to previos residence with rommate        Criminal Activity/Legal Involvement Pertinent to Current Situation/Hospitalization: No - Comment as needed  Activities of Daily Living      Permission Sought/Granted Permission sought to share information with : Facility Industrial/product designer granted to share information with : Yes, Verbal Permission Granted  Share Information with NAME: Dorathy Daft     Permission granted to share info w Relationship: CNA  Permission granted to share info w Contact Information: [phone]  Emotional Assessment Appearance:: Appears older than stated age Attitude/Demeanor/Rapport: Engaged, Gracious Affect (typically  observed): Calm Orientation: : Oriented to Self, Oriented to Place, Oriented to  Time, Oriented to Situation      Admission diagnosis:  bodyache Patient Active Problem List   Diagnosis Date Noted   Social problem not due to mental disorder 02/05/2021   Pressure injury of skin 07/22/2020   Cocaine abuse with cocaine-induced mood disorder (HCC) 04/16/2020   Polysubstance abuse (HCC)    Leg edema, left 03/17/2020   Prolonged QT interval 03/17/2020   COVID-19 virus infection 03/16/2020   Iron deficiency anemia 01/23/2020   Iron deficiency anemia due to chronic blood loss    Anemia due to vitamin B12 deficiency    Acute metabolic encephalopathy    Ovarian mass    Acute lower UTI 01/22/2020   Obesity, Class III, BMI 40-49.9 (morbid obesity) (HCC) 01/22/2020   UTI (urinary tract infection) 01/22/2020   Lymphedema 11/02/2019   GERD (gastroesophageal reflux disease) 11/02/2019   Essential hypertension 11/02/2019   Cellulitis 04/01/2019   Seizure disorder (HCC)    Prosthetic eye globe    Anxiety    ETOH abuse    Elevated liver enzymes    Hepatitis C 10/18/2013   Weakness of left side of body 07/27/1997   Blindness of left eye 07/27/1997   PCP:  [facility]:   [facility] - [place], [place] - [address] [place] [address] Phone: [phone] Fax: [phone]  MEDICAL [address] [place], [place] - [address] [place] [address] Phone: [phone] Fax: [phone]     Social Determinants of Health (SDOH) Interventions    Readmission Risk Interventions No flowsheet data found.

## 2021-02-06 NOTE — Progress Notes (Signed)
   02/06/21 1530  Clinical Encounter Type  Visited With Patient  Visit Type Initial;Spiritual support;Social support  Referral From Other (Comment) (rounding)  Spiritual Encounters  Spiritual Needs Prayer;Literature  Chaplain Burris encountered Ms. Lynk. PT was resting but needed some assistance. Chaplain Burris provided hospitality and then PT requested prayer. Chaplain Burris offered prayer and also offered that anointing oil was at hand. Ms. Plott was very glad to receive this ritual blessing of oil and asked if chaplain had any literature. Chaplain Burris provided a devotional and offered words of encouragement.

## 2021-02-06 NOTE — ED Notes (Signed)
Pt c/o left foot pain and requesting pain medication. MD made aware
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
## 2021-02-06 NOTE — [place] FL2 (Deleted)
[place] MEDICAID FL2 LEVEL OF CARE SCREENING TOOL     IDENTIFICATION  Patient Name: Victoria Gross Birthdate: [birth year] Sex: female Admission Date (Current Location): 02/05/2021  [facility] [place] Number:  [address] [id number] T Facility and Address:  [facility], [address], [place], [place] [address]      Provider Number: [id number]  Attending Physician Name and Address:  No att. providers found  Relative Name and Phone Number:  Dajae, Kizer Daughter     [phone]    Current Level of Care: Hospital Recommended Level of Care: Skilled Nursing Facility Prior Approval Number:    Date Approved/Denied:   PASRR Number:    Discharge Plan: Other (Comment) (ALF)    Current Diagnoses: Patient Active Problem List   Diagnosis Date Noted   Social problem not due to mental disorder 02/05/2021   Pressure injury of skin 07/22/2020   Cocaine abuse with cocaine-induced mood disorder (HCC) 04/16/2020   Polysubstance abuse (HCC)    Leg edema, left 03/17/2020   Prolonged QT interval 03/17/2020   COVID-19 virus infection 03/16/2020   Iron deficiency anemia 01/23/2020   Iron deficiency anemia due to chronic blood loss    Anemia due to vitamin B12 deficiency    Acute metabolic encephalopathy    Ovarian mass    Acute lower UTI 01/22/2020   Obesity, Class III, BMI 40-49.9 (morbid obesity) (HCC) 01/22/2020   UTI (urinary tract infection) 01/22/2020   Lymphedema 11/02/2019   GERD (gastroesophageal reflux disease) 11/02/2019   Essential hypertension 11/02/2019   Cellulitis 04/01/2019   Seizure disorder (HCC)    Prosthetic eye globe    Anxiety    ETOH abuse    Elevated liver enzymes    Hepatitis C 10/18/2013   Weakness of left side of body 07/27/1997   Blindness of left eye 07/27/1997    Orientation RESPIRATION BLADDER Height & Weight     Self, Time, Situation, Place  Normal Incontinent Weight:   Height:     BEHAVIORAL SYMPTOMS/MOOD  NEUROLOGICAL BOWEL NUTRITION STATUS      Incontinent Diet  AMBULATORY STATUS COMMUNICATION OF NEEDS Skin   Extensive Assist Verbally Normal                       Personal Care Assistance Level of Assistance  Bathing, Feeding, Dressing Bathing Assistance: Limited assistance Feeding assistance: Limited assistance Dressing Assistance: Limited assistance     Functional Limitations Info  Sight, Speech, Hearing Sight Info: Adequate Hearing Info: Adequate Speech Info: Adequate    SPECIAL CARE FACTORS FREQUENCY  PT (By licensed PT), OT (By licensed OT)     PT Frequency: 5x week OT Frequency: 5x week            Contractures Contractures Info: Present (Left upper and lower extrimities)    Additional Factors Info  Allergies   Allergies Info: aspirin, Prozac, Tomato           Current Medications (02/06/2021):  This is the current hospital active medication list Current Facility-Administered Medications  Medication Dose Route Frequency Provider Last Rate Last Admin   albuterol (VENTOLIN HFA) 108 (90 Base) MCG/ACT inhaler 2 puff  2 puff Inhalation Q6H PRN Chesley Noon, MD       amLODipine (NORVASC) tablet 5 mg  5 mg Oral Daily Chesley Noon, MD   5 mg at 02/06/21 0910   lidocaine (LIDODERM) 5 % 1 patch  1 patch Transdermal Q24H Lucy Chris, PA   1 patch  at 02/05/21 1438   Current Outpatient Medications  Medication Sig Dispense Refill   acetaminophen (TYLENOL) 500 MG tablet Take 500 mg by mouth every 6 (six) hours as needed.     albuterol (VENTOLIN HFA) 108 (90 Base) MCG/ACT inhaler Inhale into the lungs every 6 (six) hours as needed for wheezing or shortness of breath.     amLODipine (NORVASC) 10 MG tablet Take 10 mg by mouth daily.     baclofen (LIORESAL) 10 MG tablet Take 10 mg by mouth 3 (three) times daily.     clotrimazole (LOTRIMIN) 1 % cream Apply 1 application topically daily.     DULoxetine (CYMBALTA) 60 MG capsule Take 60 mg by mouth daily.      fluticasone (FLONASE) 50 MCG/ACT nasal spray Place 1 spray into both nostrils daily as needed.     SUMAtriptan (IMITREX) 50 MG tablet Take 1 tablet by mouth daily.     traZODone (DESYREL) 50 MG tablet Take 100 mg by mouth at bedtime as needed.     amLODipine (NORVASC) 5 MG tablet Take 5 mg by mouth daily. (Patient not taking: No sig reported)     DULoxetine (CYMBALTA) 30 MG capsule Take 30 mg by mouth daily. (Patient not taking: No sig reported)     medroxyPROGESTERone (DEPO-PROVERA) 150 MG/ML injection Inject 150 mg into the muscle every 3 (three) months.     methocarbamol (ROBAXIN) 500 MG tablet Take 1 tablet (500 mg total) by mouth every 8 (eight) hours as needed for muscle spasms. (Patient not taking: Reported on 02/05/2021) 20 tablet 0   neomycin-polymyxin b-dexamethasone (MAXITROL) 3.5-10000-0.1 SUSP Place 2 drops into the left eye 2 (two) times daily as needed (eye drainage). (Patient not taking: Reported on 02/05/2021)     oxyCODONE-acetaminophen (PERCOCET) 5-325 MG tablet Take 1 tablet by mouth every 8 (eight) hours as needed for severe pain. (Patient not taking: Reported on 02/05/2021) 12 tablet 0     Discharge Medications: Please see discharge summary for a list of discharge medications.  Relevant Imaging Results:  Relevant Lab Results:   Additional Information SS: [id number]  Maud Deed, LCSW

## 2021-02-06 NOTE — ED Provider Notes (Signed)
Patient is now complaining of pain in the left foot along the side of the foot laterally.  Also her pinky toe hurts she says.  She said her whole leg was swollen and painful before has been that way for months now is just the foot.  Her leg is wrinkled as though swelling has gone down.  She does have a lidocaine patch just below the left knee.  I do not see any skin disruption or discoloration or swelling that could account for anything however we will get an x-ray of her foot and see if there is anything could be going on like an occult fracture.  We will give her some Tylenol and possibly another lidocaine patch for that area if nothing shows up.Arnaldo Natal, MD 02/06/21 [phone]

## 2021-02-06 NOTE — ED Notes (Signed)
Pt given meal tray.

## 2021-02-06 NOTE — ED Provider Notes (Signed)
Emergency Medicine Observation Re-evaluation Note  Victoria Gross is a 50 y.o. female, seen on rounds today.    Physical Exam  BP 127/63   Pulse 74   Temp 97.7 F (36.5 C) (Oral)   Resp 16   SpO2 100%  Physical Exam General: Patient resting comfortably in bed Lungs: Patient in no respiratory distress Psych: Patient is not agitated  ED Course / MDM  EKG:     Plan  Patient is awaiting Adult Protective Services and further social work evaluation for her situation.  She may need placement in a different living facility/arrangement.   Arnaldo Natal, MD 02/06/21 1610

## 2021-02-06 NOTE — ED Notes (Signed)
Pt with small BM. Brief changed. Purewik back in place.
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
## 2021-02-06 NOTE — ED Notes (Signed)
Responded to call bell. Pts purewick out of place and briefs soaked. Pt linens changed. Pt cleaned, placed in fresh brief and repositioned in bed. Purewick replaced. Pt resting comfortably.

## 2021-02-07 DIAGNOSIS — M545 Low back pain, unspecified: Secondary | ICD-10-CM | POA: Diagnosis not present

## 2021-02-07 MED ORDER — TRAZODONE HCL 50 MG PO TABS
50.0000 mg | ORAL_TABLET | Freq: Every day | ORAL | Status: DC
  Administered 2021-02-08: 50 mg via ORAL
  Filled 2021-02-07 (×2): qty 1

## 2021-02-07 MED ORDER — ACETAMINOPHEN 500 MG PO TABS
1000.0000 mg | ORAL_TABLET | Freq: Once | ORAL | Status: AC
  Administered 2021-02-07: 1000 mg via ORAL
  Filled 2021-02-07: qty 2

## 2021-02-07 MED ORDER — DIPHENHYDRAMINE HCL 25 MG PO CAPS
25.0000 mg | ORAL_CAPSULE | Freq: Once | ORAL | Status: AC
  Administered 2021-02-07: 25 mg via ORAL
  Filled 2021-02-07: qty 1

## 2021-02-07 MED ORDER — ACETAMINOPHEN 325 MG PO TABS
650.0000 mg | ORAL_TABLET | Freq: Once | ORAL | Status: AC
  Administered 2021-02-07: 650 mg via ORAL
  Filled 2021-02-07: qty 2

## 2021-02-07 NOTE — ED Notes (Signed)
Lunch tray given. 

## 2021-02-07 NOTE — ED Notes (Signed)
Assisted patient in bed bath. New brief and purewick placed. Linen replaced. C/O headache at this time. Reaching out to MD for pain medication. Patient denies any further needs at this time. Breakfast tray set up for patient.

## 2021-02-07 NOTE — ED Notes (Signed)
Pt transferred from stretcher to hospital bed by this RN and Ted Mcalpine. Belongings placed on table to right of pt. Pt instructed on adjustment of bed. Call light within reach. Purewick remains in place connected to suction. Warm blankets placed on pt. Denies further needs at this time.

## 2021-02-07 NOTE — ED Provider Notes (Signed)
-----------------------------------------   5:20 AM on 02/07/2021 -----------------------------------------   Blood pressure 127/73, pulse 74, temperature 97.7 F (36.5 C), temperature source Oral, resp. rate 16, SpO2 100 %.  The patient is calm and cooperative at this time.  There have been no acute events since the last update.  Awaiting disposition plan from Social Work team.   Irean Hong, MD 02/07/21 [phone]

## 2021-02-07 NOTE — ED Notes (Signed)
Patient requesting benadryl for itching on back. No skin issues noted. MD notified

## 2021-02-07 NOTE — TOC Progression Note (Addendum)
Transition of Care [facility]) - Progression Note    Patient Details  Name: Victoria Gross MRN: [id number] Date of Birth: 1970/11/25  [facility] of [facility]) CM/SW Contact  Marina Goodell Phone Number: [phone] 02/07/2021, 2:47 PM  Clinical Narrative:     CSW faxed the patient's FL2 to Diona Fanti, [facility] DSS/APS [phone].  [facility] DSS/APS assisting w/ ALF placement.  Possible placement to [facility].  Expected Discharge Plan: Long Term Nursing Home Barriers to Discharge: ED Bed availability  Expected Discharge Plan and Services Expected Discharge Plan: Long Term Nursing Home                                               Social Determinants of Health (SDOH) Interventions    Readmission Risk Interventions No flowsheet data found.

## 2021-02-07 NOTE — ED Notes (Signed)
Pt given more ice as requested. Visitor remains at bedside.

## 2021-02-07 NOTE — ED Notes (Signed)
Patient brief changed with new pureiwck in place. When wiping, this nurse noted some blood on tissue. Gave patient barrier cream to use as needed for irritation. Denies any further needs at this time.

## 2021-02-08 DIAGNOSIS — M545 Low back pain, unspecified: Secondary | ICD-10-CM | POA: Diagnosis not present

## 2021-02-08 MED ORDER — TRAZODONE HCL 100 MG PO TABS
100.0000 mg | ORAL_TABLET | Freq: Every day | ORAL | Status: DC
  Administered 2021-02-09 (×2): 100 mg via ORAL
  Filled 2021-02-08 (×2): qty 1

## 2021-02-08 MED ORDER — DOCUSATE SODIUM 100 MG PO CAPS
100.0000 mg | ORAL_CAPSULE | Freq: Once | ORAL | Status: AC
  Administered 2021-02-08: 100 mg via ORAL
  Filled 2021-02-08: qty 1

## 2021-02-08 NOTE — ED Notes (Signed)
Pt resting with eyes closed and even respirations.

## 2021-02-08 NOTE — ED Notes (Signed)
Pt eating breakfast 

## 2021-02-08 NOTE — ED Notes (Signed)
Pt given lunch tray.

## 2021-02-08 NOTE — ED Notes (Signed)
Pt given ice, placed on bed pan and repositioned in bed at this time

## 2021-02-08 NOTE — ED Provider Notes (Signed)
-----------------------------------------   7:56 AM on 02/08/2021 -----------------------------------------   Blood pressure 133/76, pulse 77, temperature 97.9 F (36.6 C), temperature source Oral, resp. rate 16, SpO2 99 %.  The patient is calm and cooperative at this time.  There have been no acute events since the last update.  Awaiting disposition plan from [facility] team.   Loleta Rose, MD 02/08/21 [phone]

## 2021-02-08 NOTE — ED Notes (Signed)
Pt repositioned in bed, no needs at this time

## 2021-02-08 NOTE — ED Notes (Signed)
Pt given coffee at this time 

## 2021-02-09 DIAGNOSIS — M545 Low back pain, unspecified: Secondary | ICD-10-CM | POA: Diagnosis not present

## 2021-02-09 MED ORDER — BACLOFEN 10 MG PO TABS
10.0000 mg | ORAL_TABLET | Freq: Three times a day (TID) | ORAL | Status: DC
  Administered 2021-02-09 – 2021-02-10 (×5): 10 mg via ORAL
  Filled 2021-02-09 (×6): qty 1

## 2021-02-09 NOTE — ED Notes (Signed)
Patient given ice chips and gingerale

## 2021-02-09 NOTE — ED Notes (Signed)
Patient set up with breakfast tray 

## 2021-02-09 NOTE — ED Notes (Signed)
Patient requesting to speak with MD. MD made aware. 

## 2021-02-09 NOTE — ED Notes (Signed)
Pt asleep at this time

## 2021-02-09 NOTE — ED Notes (Signed)
Patient requesting to see MD. MD Katrinka Blazing made aware

## 2021-02-10 DIAGNOSIS — M545 Low back pain, unspecified: Secondary | ICD-10-CM | POA: Diagnosis not present

## 2021-02-10 LAB — RESP PANEL BY RT-PCR (FLU A&B, COVID) ARPGX2
Influenza A by PCR: NEGATIVE
Influenza B by PCR: NEGATIVE
SARS Coronavirus 2 by RT PCR: NEGATIVE

## 2021-02-10 NOTE — ED Notes (Signed)
Pt provided lunch tray. Repositioned in bed

## 2021-02-10 NOTE — ED Notes (Signed)
PT at bedside.
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
## 2021-02-10 NOTE — ED Notes (Signed)
[facility] notified that pt OTW

## 2021-02-10 NOTE — ED Notes (Signed)
Pt provided ginger ale as requested. Pt talking on phone. Denies needs

## 2021-02-10 NOTE — ED Notes (Signed)
Unable to sign for d/c, hemiparalysis

## 2021-02-10 NOTE — Discharge Instructions (Addendum)
Please take the medicine as you have been getting here in the hospital.  Please return for any further problems.  Try to keep your legs elevated if you are not up.

## 2021-02-10 NOTE — ED Notes (Signed)
Pt Is requesting Tylenol will notify provider for PRN order

## 2021-02-10 NOTE — ED Provider Notes (Signed)
-----------------------------------------   7:37 AM on 02/10/2021 -----------------------------------------   Blood pressure 140/79, pulse 92, temperature 98.8 F (37.1 C), temperature source Oral, resp. rate 20, SpO2 100 %.  The patient is calm and cooperative at this time.  There have been no acute events since the last update.  Awaiting disposition plan from [facility] team.   Loleta Rose, MD 02/10/21 [phone]

## 2021-02-10 NOTE — TOC Progression Note (Signed)
Transition of Care [facility]) - Progression Note    Patient Details  Name: SHARA HARTIS MRN: [id number] Date of Birth: 12-Aug-1970  Transition of Care [facility]) CM/SW Contact  Marina Goodell Phone Number: [phone] 02/10/2021, 10:51 AM  Clinical Narrative:     CSW spoke with Diona Fanti [facility] DSS/APS [phone], [phone].  Patient stated she is willing to go to SNF placement and then to ALF.  Ms. Yetta Barre is assisting with placement.  CSW contacted [facility] [phone] about SNF placement for patient.  Kenney Houseman stated the new director at [facility] Dr. Leonia Reader stated he will not accept a patient with an AVS if they have been on the ED five or more days.  CSW stated the patient's will only receive AVS from the ED regardless of the length of time.  Kenney Houseman stated she would speak with Dr. Leonia Reader.  CSW requested COVID test for possible placement.  Expected Discharge Plan: Long Term Nursing Home Barriers to Discharge: ED Bed availability  Expected Discharge Plan and Services Expected Discharge Plan: Long Term Nursing Home                                               Social Determinants of Health (SDOH) Interventions    Readmission Risk Interventions No flowsheet data found.

## 2021-02-10 NOTE — TOC Transition Note (Signed)
Transition of Care [facility]) - CM/SW Discharge Note   Patient Details  Name: Victoria Gross MRN: [id number] Date of Birth: [birth year]  Transition of Care [facility]) CM/SW Contact:  Marina Goodell Phone Number: [phone] 02/10/2021, 3:41 PM   Clinical Narrative:     Patient will d/c to [facility] room 18B, report [phone], authorization [id number], from 7/18 to 7/20. EDP/ED staff updated.  [facility] DSS updated by CSW.    Barriers to Discharge: ED Bed availability   Patient Goals and CMS Choice Patient states their goals for this hospitalization and ongoing recovery are:: Pt states that she woukd like to go to a long term care facility CMS Medicare.gov Compare Post Acute Care list provided to:: Patient Choice offered to / list presented to : Patient  Discharge Placement                       Discharge Plan and Services                                     Social Determinants of Health (SDOH) Interventions     Readmission Risk Interventions No flowsheet data found.

## 2021-02-10 NOTE — ED Notes (Signed)
Pt placed on bed pan for BM, unable to have BM. chux and brief changed. New purewick placed. Repositioned in bed.  Pt dropped phone, provided phone, trash picked up around area.  Denies other needs

## 2021-02-10 NOTE — TOC Progression Note (Signed)
Transition of Care [facility]) - Progression Note    Patient Details  Name: Victoria Gross MRN: [id number] Date of Birth: 1970-10-21  Transition of Care [facility]) CM/SW Contact  Marina Goodell Phone Number: [phone] 02/10/2021, 1:06 PM  Clinical Narrative:     Patient has bed offer at [facility].  CSW started insurance authorization via the [employer] portal REF.  Diona Fanti w/ [facility] DSS/APS updated. Placement pending insurance authorization.  Expected Discharge Plan: Long Term Nursing Home Barriers to Discharge: ED Bed availability  Expected Discharge Plan and Services Expected Discharge Plan: Long Term Nursing Home                                               Social Determinants of Health (SDOH) Interventions    Readmission Risk Interventions No flowsheet data found.

## 2021-02-11 ENCOUNTER — Ambulatory Visit: Payer: Medicare Other | Admitting: Occupational Therapy

## 2021-02-18 ENCOUNTER — Ambulatory Visit: Payer: Medicare Other | Admitting: Occupational Therapy

## 2021-02-20 ENCOUNTER — Encounter: Payer: Medicare Other | Admitting: Occupational Therapy

## 2021-03-25 ENCOUNTER — Encounter (INDEPENDENT_AMBULATORY_CARE_PROVIDER_SITE_OTHER): Payer: Self-pay

## 2021-03-25 ENCOUNTER — Ambulatory Visit (INDEPENDENT_AMBULATORY_CARE_PROVIDER_SITE_OTHER): Payer: Medicare Other | Admitting: Podiatry

## 2021-03-25 ENCOUNTER — Other Ambulatory Visit: Payer: Self-pay

## 2021-03-25 VITALS — BP 145/89 | HR 73 | Temp 98.6°F | Resp 16

## 2021-03-25 DIAGNOSIS — M79674 Pain in right toe(s): Secondary | ICD-10-CM | POA: Diagnosis not present

## 2021-03-25 DIAGNOSIS — M79675 Pain in left toe(s): Secondary | ICD-10-CM | POA: Diagnosis not present

## 2021-03-25 DIAGNOSIS — B351 Tinea unguium: Secondary | ICD-10-CM | POA: Diagnosis not present
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
## 2021-06-27 ENCOUNTER — Ambulatory Visit (INDEPENDENT_AMBULATORY_CARE_PROVIDER_SITE_OTHER): Payer: Self-pay | Admitting: Podiatry

## 2021-06-27 DIAGNOSIS — Z91199 Patient's noncompliance with other medical treatment and regimen due to unspecified reason: Secondary | ICD-10-CM

## 2021-06-30 NOTE — Progress Notes (Signed)
No show

## 2021-09-22 ENCOUNTER — Emergency Department: Payer: Medicare Other

## 2021-09-22 ENCOUNTER — Other Ambulatory Visit: Payer: Self-pay

## 2021-09-22 ENCOUNTER — Emergency Department
Admission: EM | Admit: 2021-09-22 | Discharge: 2021-09-22 | Disposition: A | Discharge status: Home / Self Care | Payer: Medicare Other | Attending: Emergency Medicine | Admitting: Emergency Medicine

## 2021-09-22 DIAGNOSIS — S3992XA Unspecified injury of lower back, initial encounter: Secondary | ICD-10-CM | POA: Diagnosis present

## 2021-09-22 DIAGNOSIS — S0083XA Contusion of other part of head, initial encounter: Secondary | ICD-10-CM | POA: Insufficient documentation

## 2021-09-22 DIAGNOSIS — S300XXA Contusion of lower back and pelvis, initial encounter: Secondary | ICD-10-CM | POA: Diagnosis not present

## 2021-09-22 DIAGNOSIS — W19XXXA Unspecified fall, initial encounter: Secondary | ICD-10-CM
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
## 2021-11-03 ENCOUNTER — Other Ambulatory Visit: Payer: Self-pay | Admitting: Family Medicine

## 2021-11-03 ENCOUNTER — Ambulatory Visit: Payer: Medicare Other

## 2021-11-03 DIAGNOSIS — R600 Localized edema: Secondary | ICD-10-CM

## 2021-11-06 ENCOUNTER — Ambulatory Visit: Payer: Medicare Other

## 2021-11-10 ENCOUNTER — Ambulatory Visit: Admission: RE | Admit: 2021-11-10 | Payer: Medicare Other | Source: Ambulatory Visit

## 2021-11-25 ENCOUNTER — Ambulatory Visit: Payer: Medicare Other | Attending: Family Medicine | Admitting: Occupational Therapy

## 2021-12-01 ENCOUNTER — Encounter: Payer: Medicare Other | Admitting: Occupational Therapy

## 2021-12-05 ENCOUNTER — Encounter: Payer: Medicare Other | Admitting: Occupational Therapy

## 2022-01-02 ENCOUNTER — Other Ambulatory Visit: Payer: Self-pay | Admitting: Orthopedic Surgery

## 2022-01-02 ENCOUNTER — Ambulatory Visit
Admission: RE | Admit: 2022-01-02 | Discharge: 2022-01-02 | Disposition: A | Discharge status: Home / Self Care | Payer: Medicare Other | Source: Ambulatory Visit | Attending: Orthopedic Surgery | Admitting: Orthopedic Surgery

## 2022-01-02 DIAGNOSIS — M7989 Other specified soft tissue disorders: Secondary | ICD-10-CM

## 2022-01-19 ENCOUNTER — Encounter (INDEPENDENT_AMBULATORY_CARE_PROVIDER_SITE_OTHER): Payer: Self-pay | Admitting: Nurse Practitioner

## 2022-01-19 ENCOUNTER — Ambulatory Visit (INDEPENDENT_AMBULATORY_CARE_PROVIDER_SITE_OTHER): Payer: Medicare Other | Admitting: Nurse Practitioner

## 2022-01-19 VITALS — BP 159/65 | HR 83 | Resp 16

## 2022-01-19 DIAGNOSIS — I890 Lymphedema, not elsewhere classified: Secondary | ICD-10-CM

## 2022-01-19 DIAGNOSIS — G629 Polyneuropathy, unspecified: Secondary | ICD-10-CM | POA: Diagnosis not present

## 2022-01-23 ENCOUNTER — Emergency Department: Payer: Medicare Other

## 2022-01-23 ENCOUNTER — Other Ambulatory Visit: Payer: Self-pay

## 2022-01-23 ENCOUNTER — Inpatient Hospital Stay
Admission: EM | Admit: 2022-01-23 | Discharge: 2022-02-02 | DRG: 683 | Disposition: A | Discharge status: Skilled Nursing Facility | Payer: Medicare Other | Attending: Internal Medicine | Admitting: Internal Medicine

## 2022-01-23 ENCOUNTER — Encounter: Payer: Self-pay | Admitting: Intensive Care

## 2022-01-23 DIAGNOSIS — I10 Essential (primary) hypertension: Secondary | ICD-10-CM | POA: Diagnosis present

## 2022-01-23 DIAGNOSIS — K5900 Constipation, unspecified: Secondary | ICD-10-CM | POA: Diagnosis present

## 2022-01-23 DIAGNOSIS — Z87820 Personal history of traumatic brain injury: Secondary | ICD-10-CM

## 2022-01-23 DIAGNOSIS — K219 Gastro-esophageal reflux disease without esophagitis: Secondary | ICD-10-CM | POA: Diagnosis present

## 2022-01-23 DIAGNOSIS — R300 Dysuria: Secondary | ICD-10-CM | POA: Diagnosis present

## 2022-01-23 DIAGNOSIS — Z1611 Resistance to penicillins: Secondary | ICD-10-CM | POA: Diagnosis present

## 2022-01-23 DIAGNOSIS — G43909 Migraine, unspecified, not intractable, without status migrainosus: Secondary | ICD-10-CM | POA: Diagnosis present

## 2022-01-23 DIAGNOSIS — Z91018 Allergy to other foods: Secondary | ICD-10-CM

## 2022-01-23 DIAGNOSIS — N3001 Acute cystitis with hematuria: Secondary | ICD-10-CM | POA: Diagnosis not present

## 2022-01-23 DIAGNOSIS — Z793 Long term (current) use of hormonal contraceptives: Secondary | ICD-10-CM

## 2022-01-23 DIAGNOSIS — Z888 Allergy status to other drugs, medicaments and biological substances status: Secondary | ICD-10-CM

## 2022-01-23 DIAGNOSIS — L739 Follicular disorder, unspecified: Secondary | ICD-10-CM | POA: Diagnosis present

## 2022-01-23 DIAGNOSIS — G4733 Obstructive sleep apnea (adult) (pediatric): Secondary | ICD-10-CM | POA: Diagnosis present

## 2022-01-23 DIAGNOSIS — Z79899 Other long term (current) drug therapy: Secondary | ICD-10-CM

## 2022-01-23 DIAGNOSIS — B961 Klebsiella pneumoniae [K. pneumoniae] as the cause of diseases classified elsewhere: Secondary | ICD-10-CM | POA: Diagnosis present

## 2022-01-23 DIAGNOSIS — F17210 Nicotine dependence, cigarettes, uncomplicated: Secondary | ICD-10-CM | POA: Diagnosis present

## 2022-01-23 DIAGNOSIS — W1839XA Other fall on same level, initial encounter: Secondary | ICD-10-CM | POA: Diagnosis not present

## 2022-01-23 DIAGNOSIS — F32A Depression, unspecified: Secondary | ICD-10-CM | POA: Diagnosis present

## 2022-01-23 DIAGNOSIS — R748 Abnormal levels of other serum enzymes: Secondary | ICD-10-CM | POA: Diagnosis present

## 2022-01-23 DIAGNOSIS — R531 Weakness: Secondary | ICD-10-CM | POA: Diagnosis present

## 2022-01-23 DIAGNOSIS — F419 Anxiety disorder, unspecified: Secondary | ICD-10-CM | POA: Diagnosis present

## 2022-01-23 DIAGNOSIS — E876 Hypokalemia: Secondary | ICD-10-CM | POA: Diagnosis present

## 2022-01-23 DIAGNOSIS — Z886 Allergy status to analgesic agent status: Secondary | ICD-10-CM

## 2022-01-23 DIAGNOSIS — Z9049 Acquired absence of other specified parts of digestive tract: Secondary | ICD-10-CM

## 2022-01-23 DIAGNOSIS — S20229A Contusion of unspecified back wall of thorax, initial encounter: Secondary | ICD-10-CM | POA: Diagnosis present

## 2022-01-23 DIAGNOSIS — N179 Acute kidney failure, unspecified: Principal | ICD-10-CM | POA: Diagnosis present

## 2022-01-23 DIAGNOSIS — F1010 Alcohol abuse, uncomplicated: Secondary | ICD-10-CM | POA: Diagnosis present

## 2022-01-23 DIAGNOSIS — G629 Polyneuropathy, unspecified: Secondary | ICD-10-CM | POA: Diagnosis present

## 2022-01-23 DIAGNOSIS — Y92230 Patient room in hospital as the place of occurrence of the external cause: Secondary | ICD-10-CM | POA: Diagnosis not present

## 2022-01-23 DIAGNOSIS — M79605 Pain in left leg: Secondary | ICD-10-CM

## 2022-01-23 DIAGNOSIS — Z6841 Body Mass Index (BMI) 40.0 and over, adult: Secondary | ICD-10-CM

## 2022-01-23 DIAGNOSIS — G40909 Epilepsy, unspecified, not intractable, without status epilepticus: Secondary | ICD-10-CM | POA: Diagnosis present

## 2022-01-23 DIAGNOSIS — H5462 Unqualified visual loss, left eye, normal vision right eye: Secondary | ICD-10-CM | POA: Diagnosis present

## 2022-01-23 DIAGNOSIS — R600 Localized edema: Secondary | ICD-10-CM | POA: Diagnosis present

## 2022-01-23 DIAGNOSIS — M545 Low back pain, unspecified: Secondary | ICD-10-CM | POA: Diagnosis present

## 2022-01-23 DIAGNOSIS — Z970 Presence of artificial eye: Secondary | ICD-10-CM

## 2022-01-23 DIAGNOSIS — G8929 Other chronic pain: Secondary | ICD-10-CM | POA: Diagnosis present

## 2022-01-23 DIAGNOSIS — X58XXXA Exposure to other specified factors, initial encounter: Secondary | ICD-10-CM | POA: Diagnosis present

## 2022-01-23 DIAGNOSIS — H5440 Blindness, one eye, unspecified eye: Secondary | ICD-10-CM | POA: Diagnosis present

## 2022-01-23 DIAGNOSIS — G4700 Insomnia, unspecified: Secondary | ICD-10-CM | POA: Diagnosis present

## 2022-01-23 DIAGNOSIS — N390 Urinary tract infection, site not specified: Secondary | ICD-10-CM | POA: Diagnosis present

## 2022-01-23 DIAGNOSIS — I890 Lymphedema, not elsewhere classified: Secondary | ICD-10-CM | POA: Diagnosis present

## 2022-01-23 LAB — COMPREHENSIVE METABOLIC PANEL
ALT: 16 U/L (ref 0–44)
AST: 17 U/L (ref 15–41)
Albumin: 4.2 g/dL (ref 3.5–5.0)
Alkaline Phosphatase: 94 U/L (ref 38–126)
Anion gap: 11 (ref 5–15)
BUN: 13 mg/dL (ref 6–20)
CO2: 26 mmol/L (ref 22–32)
Calcium: 9.5 mg/dL (ref 8.9–10.3)
Chloride: 100 mmol/L (ref 98–111)
Creatinine, Ser: 1.28 mg/dL — ABNORMAL HIGH (ref 0.44–1.00)
GFR, Estimated: 51 mL/min — ABNORMAL LOW (ref >60)
Glucose, Bld: 102 mg/dL — ABNORMAL HIGH (ref 70–99)
Potassium: 3.4 mmol/L — ABNORMAL LOW (ref 3.5–5.1)
Sodium: 137 mmol/L (ref 135–145)
Total Bilirubin: 0.8 mg/dL (ref 0.3–1.2)
Total Protein: 8.9 g/dL — ABNORMAL HIGH (ref 6.5–8.1)

## 2022-01-23 LAB — CBC WITH DIFFERENTIAL/PLATELET
Abs Immature Granulocytes: 0.04 10*3/uL (ref 0.00–0.07)
Basophils Absolute: 0 10*3/uL (ref 0.0–0.1)
Basophils Relative: 0 %
Eosinophils Absolute: 0 10*3/uL (ref 0.0–0.5)
Eosinophils Relative: 0 %
HCT: 39.7 % (ref 36.0–46.0)
Hemoglobin: 12.3 g/dL (ref 12.0–15.0)
Immature Granulocytes: 0 %
Lymphocytes Relative: 14 %
Lymphs Abs: 1.5 10*3/uL (ref 0.7–4.0)
MCH: 22.2 pg — ABNORMAL LOW (ref 26.0–34.0)
MCHC: 31 g/dL (ref 30.0–36.0)
MCV: 71.7 fL — ABNORMAL LOW (ref 80.0–100.0)
Monocytes Absolute: 0.6 10*3/uL (ref 0.1–1.0)
Monocytes Relative: 5 %
Neutro Abs: 9.1 10*3/uL — ABNORMAL HIGH (ref 1.7–7.7)
Neutrophils Relative %: 81 %
Platelets: 326 10*3/uL (ref 150–400)
RBC: 5.54 MIL/uL — ABNORMAL HIGH (ref 3.87–5.11)
RDW: 19.6 % — ABNORMAL HIGH (ref 11.5–15.5)
WBC: 11.3 10*3/uL — ABNORMAL HIGH (ref 4.0–10.5)
nRBC: 0 % (ref 0.0–0.2)

## 2022-01-23 LAB — URINALYSIS, COMPLETE (UACMP) WITH MICROSCOPIC
Bilirubin Urine: NEGATIVE
Glucose, UA: NEGATIVE mg/dL
Ketones, ur: NEGATIVE mg/dL
Nitrite: NEGATIVE
Protein, ur: NEGATIVE mg/dL
Specific Gravity, Urine: 1.005 (ref 1.005–1.030)
WBC, UA: >50 WBC/hpf — ABNORMAL HIGH (ref 0–5)
pH: 6 (ref 5.0–8.0)

## 2022-01-23 LAB — PHOSPHORUS: Phosphorus: 4.2 mg/dL (ref 2.5–4.6)

## 2022-01-23 LAB — MAGNESIUM: Magnesium: 2.1 mg/dL (ref 1.7–2.4)

## 2022-01-23 LAB — CK: Total CK: 367 U/L — ABNORMAL HIGH (ref 38–234)

## 2022-01-23 LAB — TROPONIN I (HIGH SENSITIVITY)
Troponin I (High Sensitivity): 6 ng/L (ref <18)
Troponin I (High Sensitivity): 8 ng/L (ref <18)

## 2022-01-23 LAB — LACTIC ACID, PLASMA
Lactic Acid, Venous: 1.3 mmol/L (ref 0.5–1.9)
Lactic Acid, Venous: 1.5 mmol/L (ref 0.5–1.9)

## 2022-01-23 LAB — PROCALCITONIN: Procalcitonin: <0.1 ng/mL

## 2022-01-23 LAB — BRAIN NATRIURETIC PEPTIDE: B Natriuretic Peptide: 170.6 pg/mL — ABNORMAL HIGH (ref 0.0–100.0)

## 2022-01-23 MED ORDER — SENNOSIDES-DOCUSATE SODIUM 8.6-50 MG PO TABS
1.0000 | ORAL_TABLET | Freq: Every evening | ORAL | Status: DC | PRN
  Administered 2022-01-26: 1 via ORAL
  Filled 2022-01-23: qty 1

## 2022-01-23 MED ORDER — THIAMINE HCL 100 MG PO TABS
100.0000 mg | ORAL_TABLET | Freq: Every day | ORAL | Status: DC
  Administered 2022-01-28 – 2022-01-30 (×3): 100 mg via ORAL
  Filled 2022-01-23 (×8): qty 1

## 2022-01-23 MED ORDER — SUMATRIPTAN SUCCINATE 50 MG PO TABS
50.0000 mg | ORAL_TABLET | Freq: Every day | ORAL | Status: DC
  Administered 2022-01-24 – 2022-02-02 (×10): 50 mg via ORAL
  Filled 2022-01-23 (×11): qty 1

## 2022-01-23 MED ORDER — SODIUM CHLORIDE 0.9 % IV SOLN: INTRAVENOUS | Status: DC

## 2022-01-23 MED ORDER — GABAPENTIN 400 MG PO CAPS
400.0000 mg | ORAL_CAPSULE | Freq: Three times a day (TID) | ORAL | Status: DC
  Administered 2022-01-23 – 2022-02-02 (×29): 400 mg via ORAL
  Filled 2022-01-23 (×31): qty 1

## 2022-01-23 MED ORDER — LIDOCAINE 5 % EX PTCH
1.0000 | MEDICATED_PATCH | CUTANEOUS | Status: DC
  Administered 2022-01-23 – 2022-01-30 (×8): 1 via TRANSDERMAL
  Filled 2022-01-23 (×10): qty 1

## 2022-01-23 MED ORDER — LACTATED RINGERS IV BOLUS
1000.0000 mL | Freq: Once | INTRAVENOUS | Status: AC
  Administered 2022-01-23: 1000 mL via INTRAVENOUS

## 2022-01-23 MED ORDER — SODIUM CHLORIDE 0.9 % IV SOLN
1.0000 g | INTRAVENOUS | Status: AC
  Administered 2022-01-24 – 2022-01-25 (×2): 1 g via INTRAVENOUS
  Filled 2022-01-23 (×2): qty 10

## 2022-01-23 MED ORDER — LORAZEPAM 2 MG/ML IJ SOLN: 1.0000 mg | INTRAMUSCULAR | Status: DC | PRN

## 2022-01-23 MED ORDER — ENOXAPARIN SODIUM 40 MG/0.4ML IJ SOSY: 40.0000 mg | PREFILLED_SYRINGE | INTRAMUSCULAR | Status: DC

## 2022-01-23 MED ORDER — TRAZODONE HCL 100 MG PO TABS
100.0000 mg | ORAL_TABLET | Freq: Every evening | ORAL | Status: DC | PRN
Start: 2022-01-23 — End: 2022-02-02
  Administered 2022-01-23 – 2022-01-30 (×7): 100 mg via ORAL
  Filled 2022-01-23 (×7): qty 1

## 2022-01-23 MED ORDER — HYDRALAZINE HCL 20 MG/ML IJ SOLN: 5.0000 mg | Freq: Four times a day (QID) | INTRAMUSCULAR | Status: AC | PRN

## 2022-01-23 MED ORDER — DULOXETINE HCL 30 MG PO CPEP
30.0000 mg | ORAL_CAPSULE | Freq: Every day | ORAL | Status: DC
  Administered 2022-01-24 – 2022-01-26 (×3): 30 mg via ORAL
  Filled 2022-01-23 (×4): qty 1

## 2022-01-23 MED ORDER — SODIUM CHLORIDE 0.9 % IV SOLN
1.0000 g | Freq: Once | INTRAVENOUS | Status: AC
  Administered 2022-01-23: 1 g via INTRAVENOUS
  Filled 2022-01-23: qty 10

## 2022-01-23 MED ORDER — POTASSIUM CHLORIDE CRYS ER 20 MEQ PO TBCR
40.0000 meq | EXTENDED_RELEASE_TABLET | Freq: Once | ORAL | Status: AC
  Administered 2022-01-23: 40 meq via ORAL
  Filled 2022-01-23: qty 2

## 2022-01-23 MED ORDER — FLUTICASONE PROPIONATE 50 MCG/ACT NA SUSP
1.0000 | Freq: Every day | NASAL | Status: DC | PRN
Start: 2022-01-23 — End: 2022-02-02

## 2022-01-23 MED ORDER — ACETAMINOPHEN 650 MG RE SUPP
650.0000 mg | Freq: Four times a day (QID) | RECTAL | Status: AC | PRN
Start: 2022-01-23 — End: 2022-01-26

## 2022-01-23 MED ORDER — ALBUTEROL SULFATE (2.5 MG/3ML) 0.083% IN NEBU: 3.0000 mL | INHALATION_SOLUTION | Freq: Four times a day (QID) | RESPIRATORY_TRACT | Status: DC | PRN

## 2022-01-23 MED ORDER — ADULT MULTIVITAMIN W/MINERALS CH
1.0000 | ORAL_TABLET | Freq: Every day | ORAL | Status: DC
  Administered 2022-01-28 – 2022-01-30 (×3): 1 via ORAL
  Filled 2022-01-23 (×8): qty 1

## 2022-01-23 MED ORDER — ONDANSETRON HCL 4 MG/2ML IJ SOLN: 4.0000 mg | Freq: Four times a day (QID) | INTRAMUSCULAR | Status: DC | PRN

## 2022-01-23 MED ORDER — QUETIAPINE FUMARATE 25 MG PO TABS
25.0000 mg | ORAL_TABLET | Freq: Every day | ORAL | Status: DC
Start: 2022-01-23 — End: 2022-02-02
  Administered 2022-01-23 – 2022-02-01 (×10): 25 mg via ORAL
  Filled 2022-01-23 (×10): qty 1

## 2022-01-23 MED ORDER — LORAZEPAM 1 MG PO TABS: 1.0000 mg | ORAL_TABLET | ORAL | Status: DC | PRN

## 2022-01-23 MED ORDER — POLYETHYLENE GLYCOL 3350 17 G PO PACK
17.0000 g | PACK | Freq: Two times a day (BID) | ORAL | Status: DC | PRN
Start: 2022-01-23 — End: 2022-01-27
  Administered 2022-01-26: 17 g via ORAL
  Filled 2022-01-23: qty 1

## 2022-01-23 MED ORDER — ACETAMINOPHEN 500 MG PO TABS
1000.0000 mg | ORAL_TABLET | Freq: Once | ORAL | Status: AC
  Administered 2022-01-23: 1000 mg via ORAL
  Filled 2022-01-23: qty 2

## 2022-01-23 MED ORDER — ONDANSETRON HCL 4 MG PO TABS
4.0000 mg | ORAL_TABLET | Freq: Four times a day (QID) | ORAL | Status: DC | PRN
  Administered 2022-01-27: 4 mg via ORAL
  Filled 2022-01-23: qty 1

## 2022-01-23 MED ORDER — THIAMINE HCL 100 MG/ML IJ SOLN
100.0000 mg | Freq: Every day | INTRAMUSCULAR | Status: DC
Start: 2022-01-23 — End: 2022-01-26

## 2022-01-23 MED ORDER — FOLIC ACID 1 MG PO TABS
1.0000 mg | ORAL_TABLET | Freq: Every day | ORAL | Status: DC
Start: 2022-01-23 — End: 2022-02-02
  Administered 2022-01-28 – 2022-02-02 (×4): 1 mg via ORAL
  Filled 2022-01-23 (×8): qty 1

## 2022-01-23 MED ORDER — LORAZEPAM 2 MG/ML IJ SOLN: 2.0000 mg | INTRAMUSCULAR | Status: DC | PRN

## 2022-01-23 MED ORDER — ENOXAPARIN SODIUM 60 MG/0.6ML IJ SOSY
0.5000 mg/kg | PREFILLED_SYRINGE | INTRAMUSCULAR | Status: DC
  Administered 2022-01-23 – 2022-02-01 (×10): 45 mg via SUBCUTANEOUS
  Filled 2022-01-23 (×10): qty 0.6

## 2022-01-23 MED ORDER — ACETAMINOPHEN 325 MG PO TABS
650.0000 mg | ORAL_TABLET | Freq: Four times a day (QID) | ORAL | Status: AC | PRN
  Administered 2022-01-24 – 2022-01-26 (×7): 650 mg via ORAL
  Filled 2022-01-23 (×8): qty 2

## 2022-01-23 NOTE — Assessment & Plan Note (Signed)
Body mass index is 40.4 kg/m. Complicates overall care and prognosis.  Recommend lifestyle modifications including physical activity and diet for weight loss and overall long-term health.

## 2022-01-23 NOTE — Assessment & Plan Note (Addendum)
Presented with creatinine 1.28, up from baseline around 0.7-0.8.  Likely multifactorial due to UTI and poor p.o. intake. Renal function improved with IV fluids. Cr 1.03 today --Stop IV fluids --Monitor BMP
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
## 2022-01-23 NOTE — Assessment & Plan Note (Signed)
Chronic.  Recently started with edema wraps per vascular surgery.  Left lower extremity Doppler ultrasound in the ED was negative for DVT. --Monitor and manage pain as indicated.

## 2022-01-23 NOTE — Assessment & Plan Note (Signed)
Home amlodipine held on admission due to controlled BP with patient having not taken the medication. --Resume home amlodipine when indicated -- As needed hydralazine for now
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
## 2022-01-23 NOTE — Assessment & Plan Note (Signed)
With depression Continue home Cymbalta

## 2022-01-23 NOTE — Assessment & Plan Note (Addendum)
Patient does not remember when the last seizure was.  Appears not on any antiepileptics.  He is on gabapentin. -- Ativan as needed seizure activity -- Monitor

## 2022-01-23 NOTE — Progress Notes (Signed)
PHARMACIST - PHYSICIAN COMMUNICATION  CONCERNING:  Enoxaparin (Lovenox) for DVT Prophylaxis    RECOMMENDATION: Patient was prescribed enoxaprin 40mg  q24 hours for VTE prophylaxis.   Filed Weights   01/23/22 0812  Weight: 90.7 kg (200 lb)    Body mass index is 40.4 kg/m.  Estimated Creatinine Clearance: 52.2 mL/min (A) (by C-G formula based on SCr of 1.28 mg/dL (H)).   Based on [facility] policy patient is candidate for enoxaparin 0.5mg /kg TBW SQ every 24 hours based on BMI being >30.  DESCRIPTION: Pharmacy has adjusted enoxaparin dose per [facility] policy.  Patient is now receiving enoxaparin 45 mg every 24 hours    [facility], PharmD Clinical Pharmacist  01/23/2022 5:19 PM

## 2022-01-23 NOTE — Assessment & Plan Note (Signed)
Patient reported drinking small amount of beer daily, half can.  Daughter indicated to admitting hospitalist she drinks more.  CIWA protocol was started but we see no evidence of withdrawal. --Stop CIWA

## 2022-01-23 NOTE — Assessment & Plan Note (Addendum)
Continue home gabapentin ?

## 2022-01-23 NOTE — Assessment & Plan Note (Signed)
Treated with empiric Rocephin. Urine culture grew Klebsiella pneumoniae resistant only to ampicillin and nitrofurantoin. --Continue Rocephin through today to complete 3-day course

## 2022-01-23 NOTE — Hospital Course (Addendum)
Ms. Samara Stankowski is a 51 year old female with history of morbid obesity, left eye blindness, remote history of seizure, hypertension, depression, anxiety, neuropathy, insomnia, OSA, who presents to the emergency department for chief concerns of left leg pain and decreased mobility.  Initial vitals in the emergency department showed temperature of 98.6, respiration rate of 16, heart rate of 80, blood pressure 104/51, SPO2 of 96% on room air.  Serum sodium is 137, potassium 3.4, chloride 100, bicarb 26, nonfasting blood glucose 102, BUN of 13, serum creatinine of 1.28, GFR 51, WBC 11.3, hemoglobin 12.3, platelets of 326.  BNP elevated at 170.6.  CK elevated at 367.  Procalcitonin was negative.  Lactic acid was 1.3.  High sensitive troponin was 6.  UA was positive for large leukocytes.  Urine culture is pending.  ED treatment: Tylenol 1 g, potassium chloride 40 mill equivalent p.o., LR 1 L bolus, ceftriaxone 1 g IV.

## 2022-01-23 NOTE — ED Notes (Signed)
PT saw pt at bedside.

## 2022-01-23 NOTE — ED Notes (Signed)
First Nurse Note: Pt to ED via [facility] from home, pt is non-ambulatory at baseline. Pt is c/o left thigh pain since about 0230. Pt states that it is a throbbing pain. Pt denies any injury. Pt has wrap on left leg from knee down. Pt does not have any open sores. Pt is in NAD. VSS.

## 2022-01-23 NOTE — Assessment & Plan Note (Addendum)
K on admission was 3.4, replaced.  Most likely due to poor p.o. intake.   K today down again 3.3 --Replace with 40 mEq oral KCl -- Monitor BMP, replace K as needed

## 2022-01-23 NOTE — ED Notes (Signed)
Messaged pharmacy about Neurontin order.
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
## 2022-01-24 DIAGNOSIS — N179 Acute kidney failure, unspecified: Secondary | ICD-10-CM | POA: Diagnosis not present

## 2022-01-24 LAB — CBC
HCT: 35.8 % — ABNORMAL LOW (ref 36.0–46.0)
Hemoglobin: 11.2 g/dL — ABNORMAL LOW (ref 12.0–15.0)
MCH: 22.9 pg — ABNORMAL LOW (ref 26.0–34.0)
MCHC: 31.3 g/dL (ref 30.0–36.0)
MCV: 73.2 fL — ABNORMAL LOW (ref 80.0–100.0)
Platelets: 208 10*3/uL (ref 150–400)
RBC: 4.89 MIL/uL (ref 3.87–5.11)
RDW: 19.6 % — ABNORMAL HIGH (ref 11.5–15.5)
WBC: 8.3 10*3/uL (ref 4.0–10.5)
nRBC: 0 % (ref 0.0–0.2)

## 2022-01-24 LAB — BASIC METABOLIC PANEL
Anion gap: 8 (ref 5–15)
BUN: 15 mg/dL (ref 6–20)
CO2: 21 mmol/L — ABNORMAL LOW (ref 22–32)
Calcium: 8.5 mg/dL — ABNORMAL LOW (ref 8.9–10.3)
Chloride: 110 mmol/L (ref 98–111)
Creatinine, Ser: 1.03 mg/dL — ABNORMAL HIGH (ref 0.44–1.00)
GFR, Estimated: >60 mL/min (ref >60)
Glucose, Bld: 101 mg/dL — ABNORMAL HIGH (ref 70–99)
Potassium: 3.6 mmol/L (ref 3.5–5.1)
Sodium: 139 mmol/L (ref 135–145)

## 2022-01-24 LAB — HIV ANTIBODY (ROUTINE TESTING W REFLEX): HIV Screen 4th Generation wRfx: NONREACTIVE

## 2022-01-24 MED ORDER — LORAZEPAM 2 MG/ML IJ SOLN: 2.0000 mg | INTRAMUSCULAR | Status: DC | PRN

## 2022-01-24 MED ORDER — GERHARDT'S BUTT CREAM
TOPICAL_CREAM | Freq: Three times a day (TID) | CUTANEOUS | Status: DC
  Administered 2022-01-28 – 2022-02-02 (×4): 1 via TOPICAL
  Filled 2022-01-24: qty 1

## 2022-01-24 NOTE — [place] FL2 (Signed)
[place] MEDICAID FL2 LEVEL OF CARE SCREENING TOOL     IDENTIFICATION  Patient Name: Victoria Gross Birthdate: [birth year] Sex: female Admission Date (Current Location): 01/23/2022  [facility] and [place] Number:  Chiropodist and Address:  [facility], [address] [place], [place] [address]      Provider Number: [id number]  Attending Physician Name and Address:  Pennie Banter, DO  Relative Name and Phone Number:  Casper Harrison (Friend)   [phone] [facility])    Current Level of Care: Hospital Recommended Level of Care: Skilled Nursing Facility Prior Approval Number:    Date Approved/Denied:   PASRR Number: pending  Discharge Plan:      Current Diagnoses: Patient Active Problem List   Diagnosis Date Noted   AKI (acute kidney injury) (HCC) 01/23/2022   Hypokalemia 01/23/2022   Neuropathy 01/23/2022   Social problem not due to mental disorder 02/05/2021   Pressure injury of skin 07/22/2020   Cocaine abuse with cocaine-induced mood disorder (HCC) 04/16/2020   Polysubstance abuse (HCC)    Leg edema, left 03/17/2020   Prolonged QT interval 03/17/2020   COVID-19 virus infection 03/16/2020   Iron deficiency anemia 01/23/2020   Iron deficiency anemia due to chronic blood loss    Anemia due to vitamin B12 deficiency    Acute metabolic encephalopathy    Ovarian mass    Acute lower UTI 01/22/2020   Obesity, Class III, BMI 40-49.9 (morbid obesity) (HCC) 01/22/2020   UTI (urinary tract infection) 01/22/2020   Lymphedema 11/02/2019   GERD (gastroesophageal reflux disease) 11/02/2019   Essential hypertension 11/02/2019   Other chest pain 10/10/2019   Cellulitis 04/01/2019   Headache disorder 05/26/2016   Seizure disorder (HCC)    Prosthetic eye globe    Anxiety    ETOH abuse    Elevated liver enzymes    Hepatitis C 10/18/2013   Weakness of left side of body 07/27/1997   Blindness of left eye 07/27/1997    Orientation RESPIRATION  BLADDER Height & Weight     Self, Time, Situation, Place  Normal Incontinent Weight: 200 lb (90.7 kg) Height:  4\' 11"  (149.9 cm)  BEHAVIORAL SYMPTOMS/MOOD NEUROLOGICAL BOWEL NUTRITION STATUS        Diet (heart)  AMBULATORY STATUS COMMUNICATION OF NEEDS Skin   Extensive Assist Verbally Normal                       Personal Care Assistance Level of Assistance  Bathing, Feeding, Dressing Bathing Assistance: Maximum assistance Feeding assistance: Limited assistance Dressing Assistance: Maximum assistance     Functional Limitations Info             SPECIAL CARE FACTORS FREQUENCY  PT (By licensed PT), OT (By licensed OT)     PT Frequency: 5 times per week OT Frequency: 5 times per week            Contractures      Additional Factors Info  Code Status, Allergies Code Status Info: full Allergies Info: Aspirin, Prozac (Fluoxetine Hcl), Tomato           Current Medications (01/24/2022):  This is the current hospital active medication list Current Facility-Administered Medications  Medication Dose Route Frequency Provider Last Rate Last Admin   acetaminophen (TYLENOL) tablet 650 mg  650 mg Oral Q6H PRN Cox, Amy N, DO   650 mg at 01/24/22 0943   Or   acetaminophen (TYLENOL) suppository 650 mg  650 mg Rectal  Q6H PRN Cox, Amy N, DO       albuterol (PROVENTIL) (2.5 MG/3ML) 0.083% nebulizer solution 3 mL  3 mL Inhalation Q6H PRN Cox, Amy N, DO       cefTRIAXone (ROCEPHIN) 1 g in sodium chloride 0.9 % 100 mL IVPB  1 g Intravenous Q24H Cox, Amy N, DO       DULoxetine (CYMBALTA) DR capsule 30 mg  30 mg Oral Daily Cox, Amy N, DO   30 mg at 01/24/22 0943   enoxaparin (LOVENOX) injection 45 mg  0.5 mg/kg Subcutaneous Q24H Cox, Amy N, DO   45 mg at 01/23/22 2211   fluticasone (FLONASE) 50 MCG/ACT nasal spray 1 spray  1 spray Each Nare Daily PRN Cox, Amy N, DO       folic acid (FOLVITE) tablet 1 mg  1 mg Oral Daily Cox, Amy N, DO       gabapentin (NEURONTIN) capsule 400 mg  400  mg Oral TID Cox, Amy N, DO   400 mg at 01/24/22 8657   Gerhardt's butt cream   Topical TID Esaw Grandchild A, DO   Given at 01/24/22 0946   hydrALAZINE (APRESOLINE) injection 5 mg  5 mg Intravenous Q6H PRN Cox, Amy N, DO       lidocaine (LIDODERM) 5 % 1 patch  1 patch Transdermal Q24H Cox, Amy N, DO   1 patch at 01/24/22 1133   LORazepam (ATIVAN) tablet 1-4 mg  1-4 mg Oral Q1H PRN Cox, Amy N, DO       Or   LORazepam (ATIVAN) injection 1-4 mg  1-4 mg Intravenous Q1H PRN Cox, Amy N, DO       multivitamin with minerals tablet 1 tablet  1 tablet Oral Daily Cox, Amy N, DO       ondansetron (ZOFRAN) tablet 4 mg  4 mg Oral Q6H PRN Cox, Amy N, DO       Or   ondansetron (ZOFRAN) injection 4 mg  4 mg Intravenous Q6H PRN Cox, Amy N, DO       polyethylene glycol (MIRALAX / GLYCOLAX) packet 17 g  17 g Oral BID PRN Cox, Amy N, DO       QUEtiapine (SEROQUEL) tablet 25 mg  25 mg Oral QHS Cox, Amy N, DO   25 mg at 01/23/22 2211   senna-docusate (Senokot-S) tablet 1 tablet  1 tablet Oral QHS PRN Cox, Amy N, DO       SUMAtriptan (IMITREX) tablet 50 mg  50 mg Oral Daily Cox, Amy N, DO   50 mg at 01/24/22 8469   thiamine tablet 100 mg  100 mg Oral Daily Cox, Amy N, DO       Or   thiamine (B-1) injection 100 mg  100 mg Intravenous Daily Cox, Amy N, DO       traZODone (DESYREL) tablet 100 mg  100 mg Oral QHS PRN Cox, Amy N, DO   100 mg at 01/23/22 2213     Discharge Medications: Please see discharge summary for a list of discharge medications.  Relevant Imaging Results:  Relevant Lab Results:   Additional Information SS #: [id number]  Khamron Gellert E Zenas Santa, LCSW

## 2022-01-24 NOTE — TOC Initial Note (Addendum)
Transition of Care [facility]) - Initial/Assessment Note    Patient Details  Name: Victoria Gross MRN: [id number] Date of Birth: 1971-06-28  Transition of Care [facility]) CM/SW Contact:    Liliana Cline, LCSW Phone Number: 01/24/2022, 2:07 PM  Clinical Narrative:             CSW spoke to patient regarding SNF rec.   Patient states her cousin and brother in law live with her.  [facility] and [facility]. Patient states she has a motorized wheelchair but it is broken.  Patient went to [facility] in the past. Patient is agreeable to SNF, but does not want [facility] again. Patient uses [employer] transportation. SNF work up started. PASRR pending.    Expected Discharge Plan: Skilled Nursing Facility Barriers to Discharge: Continued Medical Work up   Patient Goals and CMS Choice Patient states their goals for this hospitalization and ongoing recovery are:: SNF CMS Medicare.gov Compare Post Acute Care list provided to:: Patient Choice offered to / list presented to : Patient  Expected Discharge Plan and Services Expected Discharge Plan: Skilled Nursing Facility       Living arrangements for the past 2 months: Single Family Home                                      Prior Living Arrangements/Services Living arrangements for the past 2 months: Single Family Home Lives with:: Relatives Patient language and need for interpreter reviewed:: Yes Do you feel safe going back to the place where you live?: Yes      Need for Family Participation in Patient Care: Yes (Comment) Care giver support system in place?: Yes (comment) Current home services: DME Criminal Activity/Legal Involvement Pertinent to Current Situation/Hospitalization: No - Comment as needed  Activities of Daily Living Home Assistive Devices/Equipment: Wheelchair ADL Screening (condition at time of admission) Patient's cognitive ability adequate to safely complete daily activities?: Yes Is the patient deaf or have  difficulty hearing?: No Does the patient have difficulty seeing, even when wearing glasses/contacts?: No Does the patient have difficulty concentrating, remembering, or making decisions?: No Patient able to express need for assistance with ADLs?: Yes Does the patient have difficulty dressing or bathing?: Yes Independently performs ADLs?: No Communication: Independent Dressing (OT): Needs assistance Is this a change from baseline?: Pre-admission baseline Grooming: Needs assistance Is this a change from baseline?: Pre-admission baseline Feeding: Needs assistance Is this a change from baseline?: Pre-admission baseline Bathing: Needs assistance Is this a change from baseline?: Pre-admission baseline Toileting: Needs assistance Is this a change from baseline?: Pre-admission baseline In/Out Bed: Needs assistance Is this a change from baseline?: Pre-admission baseline Walks in Home: Dependent Is this a change from baseline?: Pre-admission baseline Does the patient have difficulty walking or climbing stairs?: Yes Weakness of Legs: Both Weakness of Arms/Hands: Left  Permission Sought/Granted Permission sought to share information with : Oceanographer granted to share information with : Yes, Verbal Permission Granted     Permission granted to share info w AGENCY: SNFs        Emotional Assessment       Orientation: : Oriented to Self, Oriented to Place, Oriented to  Time, Oriented to Situation Alcohol / Substance Use: Not Applicable Psych Involvement: No (comment)  Admission diagnosis:  Weakness [R53.1] Elevated CK [R74.8] Acute cystitis with hematuria [N30.01] AKI (acute kidney injury) (HCC) [N17.9] Contusion of back, unspecified  laterality, initial encounter [S20.229A] Pain of left lower extremity [M79.605] Patient Active Problem List   Diagnosis Date Noted   AKI (acute kidney injury) (HCC) 01/23/2022   Hypokalemia 01/23/2022   Neuropathy  01/23/2022   Social problem not due to mental disorder 02/05/2021   Pressure injury of skin 07/22/2020   Cocaine abuse with cocaine-induced mood disorder (HCC) 04/16/2020   Polysubstance abuse (HCC)    Leg edema, left 03/17/2020   Prolonged QT interval 03/17/2020   COVID-19 virus infection 03/16/2020   Iron deficiency anemia 01/23/2020   Iron deficiency anemia due to chronic blood loss    Anemia due to vitamin B12 deficiency    Acute metabolic encephalopathy    Ovarian mass    Acute lower UTI 01/22/2020   Obesity, Class III, BMI 40-49.9 (morbid obesity) (HCC) 01/22/2020   UTI (urinary tract infection) 01/22/2020   Lymphedema 11/02/2019   GERD (gastroesophageal reflux disease) 11/02/2019   Essential hypertension 11/02/2019   Other chest pain 10/10/2019   Cellulitis 04/01/2019   Headache disorder 05/26/2016   Seizure disorder (HCC)    Prosthetic eye globe    Anxiety    ETOH abuse    Elevated liver enzymes    Hepatitis C 10/18/2013   Weakness of left side of body 07/27/1997   Blindness of left eye 07/27/1997   PCP:  [facility] Pharmacy:   [facility] [place] - [address] [place] [address] Phone: [phone] Fax: [phone]  [facility] - [place], [place] - [address] [place] [address] Phone: [phone] Fax: [phone]     Social Determinants of Health (SDOH) Interventions    Readmission Risk Interventions    01/24/2022    2:06 PM  Readmission Risk Prevention Plan  Transportation Screening Complete  PCP or Specialist Appt within 3-5 Days Complete  HRI or Home Care Consult Complete  Social Work Consult for Recovery Care Planning/Counseling Complete  Palliative Care Screening Not Applicable  Medication Review Oceanographer) Complete

## 2022-01-24 NOTE — Consult Note (Signed)
WOC Nurse Consult Note: Reason for Consult:LLE with chronic edema, lymphedema. Skin intact. Patient was seen by our team during a previous admission for irritant contact dermatitis due to urinary incontinence. She is chair fast and of bariatric proportion. Review of medical records is performed; she is admitted today for a urinary tract infection. There is no evidence of a DVT. Wound type: chronic lymphedema to LLE without wounds, irritant contact dermatitis  ICD-10 CM Codes for Irritant Dermatitis L24A2 - Due to fecal, urinary or dual incontinence  Pressure Injury POA: N/A Measurement:N/A Wound bed:N/A Drainage (amount, consistency, odor) N/A Periwound:N/A Dressing procedure/placement/frequency: I have discussed this patient's POC with Dr. Denton Lank via Secure Chat and am providing Nursing with guidance for the care of the LLE with chronic lymphedema and no wounds using a dry boot (Kerlix roll gauze applied from just below toes to just below knee topped with a 6-inch ACE bandaging system applied in a similar manner. This is to be released and removed daily for skin care consisting of cleansing the leg with soap and water, rinsing and followed by thoroughly drying, particularly between the digits. Heels are to be floated while in bed using Prevalon boots. Skin care guidance for urinary incontinence is provided using our house pH balanced, no rinse skin cleanser and following with a thin application of Gerhart's Butt cream, a compounded product consisting of 1:1:1 lotrimin cream, hydrocortisone cream and zinc oxide. Turning and repositioning while in bed is in place and guidance provided for limiting time up in a chair to 1 hours intervals. A pressure redistribution chair cushion is also provided as well as guidance for placement of a sacral foam for PI prevention.  WOC nursing team will not follow, but will remain available to this patient, the nursing and medical teams.  Please re-consult if  needed.  Thank you for inviting [place] to participate in this patient's Plan of Care.  Ladona Mow, MSN, RN, CNS, GNP, Leda Min, [employer], [employer] phone:  [phone]

## 2022-01-24 NOTE — Assessment & Plan Note (Signed)
Noted  
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
## 2022-01-24 NOTE — Assessment & Plan Note (Signed)
Appears not on PPI or H2 blocker. Monitor 

## 2022-01-25 DIAGNOSIS — R531 Weakness: Secondary | ICD-10-CM

## 2022-01-25 DIAGNOSIS — N179 Acute kidney failure, unspecified: Secondary | ICD-10-CM | POA: Diagnosis not present

## 2022-01-25 LAB — BASIC METABOLIC PANEL
Anion gap: 6 (ref 5–15)
BUN: 15 mg/dL (ref 6–20)
CO2: 27 mmol/L (ref 22–32)
Calcium: 9 mg/dL (ref 8.9–10.3)
Chloride: 106 mmol/L (ref 98–111)
Creatinine, Ser: 0.77 mg/dL (ref 0.44–1.00)
GFR, Estimated: >60 mL/min (ref >60)
Glucose, Bld: 114 mg/dL — ABNORMAL HIGH (ref 70–99)
Potassium: 3.3 mmol/L — ABNORMAL LOW (ref 3.5–5.1)
Sodium: 139 mmol/L (ref 135–145)

## 2022-01-25 LAB — URINE CULTURE: Culture: >=100000 — AB

## 2022-01-25 MED ORDER — POTASSIUM CHLORIDE CRYS ER 20 MEQ PO TBCR
30.0000 meq | EXTENDED_RELEASE_TABLET | Freq: Once | ORAL | Status: AC
Start: 2022-01-25 — End: 2022-01-25
  Administered 2022-01-25: 30 meq via ORAL
  Filled 2022-01-25: qty 1

## 2022-01-25 MED ORDER — IBUPROFEN 400 MG PO TABS
400.0000 mg | ORAL_TABLET | Freq: Once | ORAL | Status: AC
  Administered 2022-01-25: 400 mg via ORAL
  Filled 2022-01-25: qty 1

## 2022-01-25 NOTE — Assessment & Plan Note (Addendum)
Chronic and stable.  Patient relies on a wheelchair at baseline.  Has chronic left upper extremity spasticity / contractures. --Resume Baclofen -- PT OT recommending SNF. -- TOC following for placement

## 2022-01-25 NOTE — Progress Notes (Signed)
RE: Victoria Gross Date of Birth:  [birth year] Date:01/25/22   To Whom It May Concern:  Please be advised that the above-named patient will require a short-term nursing home stay - anticipated 30 days or less for rehabilitation and strengthening.  The plan is for return home.   Dr. Esaw Grandchild
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
## 2022-01-25 NOTE — TOC Progression Note (Addendum)
Transition of Care [facility]) - Progression Note    Patient Details  Name: Victoria Gross MRN: [id number] Date of Birth: 1970/12/24  Transition of Care [facility]) CM/SW Contact  Liliana Cline, LCSW Phone Number: 01/25/2022, 9:46 AM  Clinical Narrative:    Awaiting bed offers. Will need auth. PASRR pending, unable to login to [place] [facility] this morning.   11:30- Additional info uploaded into [place] [employer].  PASRR still pending.    Expected Discharge Plan: Skilled Nursing Facility Barriers to Discharge: Continued Medical Work up  Expected Discharge Plan and Services Expected Discharge Plan: Skilled Nursing Facility       Living arrangements for the past 2 months: Single Family Home                                       Social Determinants of Health (SDOH) Interventions    Readmission Risk Interventions    01/24/2022    2:06 PM  Readmission Risk Prevention Plan  Transportation Screening Complete  PCP or Specialist Appt within 3-5 Days Complete  HRI or Home Care Consult Complete  Social Work Consult for Recovery Care Planning/Counseling Complete  Palliative Care Screening Not Applicable  Medication Review Oceanographer) Complete

## 2022-01-25 NOTE — Assessment & Plan Note (Signed)
With baseline left-sided weakness, patient is profoundly weak generally in the setting of UTI, poor p.o. intake, immobility. -- PT OT recommending SNF -- TOC following for placement -- Fall precautions

## 2022-01-26 DIAGNOSIS — N179 Acute kidney failure, unspecified: Secondary | ICD-10-CM | POA: Diagnosis not present

## 2022-01-26 DIAGNOSIS — G43909 Migraine, unspecified, not intractable, without status migrainosus: Secondary | ICD-10-CM | POA: Diagnosis present

## 2022-01-26 LAB — BASIC METABOLIC PANEL
Anion gap: 6 (ref 5–15)
BUN: 15 mg/dL (ref 6–20)
CO2: 27 mmol/L (ref 22–32)
Calcium: 8.8 mg/dL — ABNORMAL LOW (ref 8.9–10.3)
Chloride: 106 mmol/L (ref 98–111)
Creatinine, Ser: 0.88 mg/dL (ref 0.44–1.00)
GFR, Estimated: >60 mL/min (ref >60)
Glucose, Bld: 105 mg/dL — ABNORMAL HIGH (ref 70–99)
Potassium: 3.7 mmol/L (ref 3.5–5.1)
Sodium: 139 mmol/L (ref 135–145)

## 2022-01-26 LAB — MAGNESIUM: Magnesium: 1.8 mg/dL (ref 1.7–2.4)

## 2022-01-26 MED ORDER — BACLOFEN 10 MG PO TABS
10.0000 mg | ORAL_TABLET | Freq: Three times a day (TID) | ORAL | Status: DC
  Administered 2022-01-26 – 2022-02-01 (×20): 10 mg via ORAL
  Filled 2022-01-26 (×21): qty 1

## 2022-01-26 MED ORDER — DULOXETINE HCL 30 MG PO CPEP
60.0000 mg | ORAL_CAPSULE | Freq: Every day | ORAL | Status: DC
  Administered 2022-01-27 – 2022-02-02 (×7): 60 mg via ORAL
  Filled 2022-01-26 (×7): qty 2

## 2022-01-26 MED ORDER — BUTALBITAL-APAP-CAFFEINE 50-325-40 MG PO TABS
1.0000 | ORAL_TABLET | Freq: Four times a day (QID) | ORAL | Status: DC | PRN
  Administered 2022-01-26 – 2022-01-30 (×3): 1 via ORAL
  Filled 2022-01-26 (×3): qty 1
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
## 2022-01-26 NOTE — Progress Notes (Deleted)
Victoria Gross has bronchiectasis, confirmed on a CT on 07/2020. Patient has had a documented daily, productive cough since 05/2020 that continues during this current admission.  The patient has tried other standard treatments such as chest physiotherapy (CPT). However, the patient does not have a caregiver that is available to provide adequate CPT for 30 minutes, twice a day, 365 days a year.  Patient would benefit from HFCWO (High Frequency Chest Wall Oscillation) Vest Therapy.  

## 2022-01-26 NOTE — TOC Progression Note (Signed)
Transition of Care [facility]) - Progression Note    Patient Details  Name: Victoria Gross MRN: [id number] Date of Birth: 11/11/1970  Transition of Care [facility]) CM/SW Contact  Chapman Fitch, RN Phone Number: 01/26/2022, 3:26 PM  Clinical Narrative:     Bed offers presented patient accepted bed at [facility] Accepted in hub, notified Revonda Standard at [place] started in [facility] portal PASSR went to level 2, currently assigned to Dareen Piano, [place] ([email or web address]) at 01/26/2022 09:27   Expected Discharge Plan: Skilled Nursing Facility Barriers to Discharge: Continued Medical Work up  Expected Discharge Plan and Services Expected Discharge Plan: Skilled Nursing Facility       Living arrangements for the past 2 months: Single Family Home                                       Social Determinants of Health (SDOH) Interventions    Readmission Risk Interventions    01/24/2022    2:06 PM  Readmission Risk Prevention Plan  Transportation Screening Complete  PCP or Specialist Appt within 3-5 Days Complete  HRI or Home Care Consult Complete  Social Work Consult for Recovery Care Planning/Counseling Complete  Palliative Care Screening Not Applicable  Medication Review Oceanographer) Complete
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
## 2022-01-26 NOTE — Care Management Important Message (Signed)
Important Message  Patient Details  Name: Victoria Gross MRN: [id number] Date of Birth: 02-05-1971   Medicare Important Message Given:  Yes     Johnell Comings 01/26/2022, 11:14 AM

## 2022-01-26 NOTE — Assessment & Plan Note (Signed)
Takes scheduled Imitrex at home, resumed.  Fioricet or Tylenol PRN.
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
## 2022-01-26 NOTE — TOC Progression Note (Signed)
Transition of Care [facility]) - Progression Note    Patient Details  Name: Victoria Gross MRN: [id number] Date of Birth: 05-24-1971  Transition of Care [facility]) CM/SW Contact  Chapman Fitch, RN Phone Number: 01/26/2022, 9:18 AM  Clinical Narrative:    No SNF bed offers.  Reached out to local facilities that have not responded in the hub  Extended bed search   Expected Discharge Plan: Skilled Nursing Facility Barriers to Discharge: Continued Medical Work up  Expected Discharge Plan and Services Expected Discharge Plan: Skilled Nursing Facility       Living arrangements for the past 2 months: Single Family Home                                       Social Determinants of Health (SDOH) Interventions    Readmission Risk Interventions    01/24/2022    2:06 PM  Readmission Risk Prevention Plan  Transportation Screening Complete  PCP or Specialist Appt within 3-5 Days Complete  HRI or Home Care Consult Complete  Social Work Consult for Recovery Care Planning/Counseling Complete  Palliative Care Screening Not Applicable  Medication Review [employer]) Complete

## 2022-01-27 DIAGNOSIS — N179 Acute kidney failure, unspecified: Secondary | ICD-10-CM | POA: Diagnosis not present

## 2022-01-27 DIAGNOSIS — K5900 Constipation, unspecified: Secondary | ICD-10-CM | POA: Clinically undetermined

## 2022-01-27 MED ORDER — POLYETHYLENE GLYCOL 3350 17 G PO PACK
17.0000 g | PACK | Freq: Two times a day (BID) | ORAL | Status: DC
  Administered 2022-01-29 – 2022-01-30 (×2): 17 g via ORAL
  Filled 2022-01-27 (×8): qty 1

## 2022-01-27 MED ORDER — MAGNESIUM HYDROXIDE 400 MG/5ML PO SUSP: 15.0000 mL | Freq: Every day | ORAL | Status: DC | PRN

## 2022-01-27 MED ORDER — BISACODYL 10 MG RE SUPP
10.0000 mg | Freq: Once | RECTAL | Status: AC
Start: 2022-01-27 — End: 2022-01-27
  Administered 2022-01-27: 10 mg via RECTAL
  Filled 2022-01-27: qty 1

## 2022-01-27 MED ORDER — HYDROCODONE-ACETAMINOPHEN 5-325 MG PO TABS
1.0000 | ORAL_TABLET | Freq: Four times a day (QID) | ORAL | Status: DC | PRN
  Administered 2022-01-27 – 2022-01-31 (×8): 1 via ORAL
  Filled 2022-01-27 (×8): qty 1

## 2022-01-27 MED ORDER — BISACODYL 5 MG PO TBEC
5.0000 mg | DELAYED_RELEASE_TABLET | Freq: Every day | ORAL | Status: DC | PRN
Start: 2022-01-27 — End: 2022-02-02

## 2022-01-27 MED ORDER — SENNOSIDES-DOCUSATE SODIUM 8.6-50 MG PO TABS
1.0000 | ORAL_TABLET | Freq: Two times a day (BID) | ORAL | Status: DC
  Administered 2022-01-29 – 2022-01-30 (×3): 1 via ORAL
  Filled 2022-01-27 (×9): qty 1

## 2022-01-27 NOTE — TOC Progression Note (Signed)
Transition of Care [facility]) - Progression Note    Patient Details  Name: Victoria Gross MRN: [id number] Date of Birth: June 05, 1971  Transition [facility] Care [facility]) CM/SW Contact  Gildardo Griffes, [place] Phone Number: 01/27/2022, 9:06 AM  Clinical Narrative:     PASRR number still pending in [place].   Insurance auth received: approved 7/3 - 7/6, next review due 7/6.  Reference ID: [id number].  Plan Auth: [id number].   Expected Discharge Plan: Skilled Nursing Facility Barriers to Discharge: Continued Medical Work up  Expected Discharge Plan and Services Expected Discharge Plan: Skilled Nursing Facility       Living arrangements for the past 2 months: Single Family Home                                       Social Determinants of Health (SDOH) Interventions    Readmission Risk Interventions    01/24/2022    2:06 PM  Readmission Risk Prevention Plan  Transportation Screening Complete  PCP or Specialist Appt within 3-5 Days Complete  HRI or Home Care Consult Complete  Social Work Consult for Recovery Care Planning/Counseling Complete  Palliative Care Screening Not Applicable  Medication Review [employer]) Complete
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
## 2022-01-27 NOTE — Assessment & Plan Note (Signed)
Patient reported on 7/4 (Tuesday) not having a BM since Friday.  Had been on as needed bowel regimen. -- MiraLAX and senna-S twice daily -- Dulcolax suppository today -- Additional bowel regimen if needed -- Patient needs to have BM before SNF discharge

## 2022-01-28 ENCOUNTER — Encounter (INDEPENDENT_AMBULATORY_CARE_PROVIDER_SITE_OTHER): Payer: Medicare Other

## 2022-01-28 DIAGNOSIS — N179 Acute kidney failure, unspecified: Secondary | ICD-10-CM | POA: Diagnosis not present
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
## 2022-01-28 NOTE — TOC Progression Note (Addendum)
Transition of Care [facility]) - Progression Note    Patient Details  Name: Victoria Gross MRN: [id number] Date of Birth: Dec 01, 1970  Transition of Care [facility]) CM/SW Contact  Margarito Liner, LCSW Phone Number: 01/28/2022, 7:28 AM  Clinical Narrative:  PASARR still pending.   11:03 am: Updated patient.  3:19 pm: PASARR still pending.  Expected Discharge Plan: Skilled Nursing Facility Barriers to Discharge: Continued Medical Work up  Expected Discharge Plan and Services Expected Discharge Plan: Skilled Nursing Facility       Living arrangements for the past 2 months: Single Family Home                                       Social Determinants of Health (SDOH) Interventions    Readmission Risk Interventions    01/24/2022    2:06 PM  Readmission Risk Prevention Plan  Transportation Screening Complete  PCP or Specialist Appt within 3-5 Days Complete  HRI or Home Care Consult Complete  Social Work Consult for Recovery Care Planning/Counseling Complete  Palliative Care Screening Not Applicable  Medication Review Oceanographer) Complete

## 2022-01-28 NOTE — Progress Notes (Signed)
Mobility Specialist - Progress Note   01/28/22 1000  Mobility  Activity Stood at bedside  Level of Assistance Minimal assist, patient does 75% or more  Assistive Device None;Other (Comment) (sink)  Activity Response Tolerated well  $Mobility charge 1 Mobility    During mobility: 90 HR, 99% SpO2   Pt sitting in chair upon arrival, utilizing RA. Pt voiced 6/10 pain in LLE but still very motivated for activity. Pt able to perform 3 STS with minA +2, using sink to assist with elevation. Foot blocking on LLE to prevent sliding. Pt attempted to take small lateral steps, but with great challenge bringing L leg forward. Mild fatigue with activity. Pt returned to recliner with alarm set, needs in reach.    Filiberto Pinks Mobility Specialist 01/28/22, 11:29 AM

## 2022-01-29 DIAGNOSIS — N179 Acute kidney failure, unspecified: Secondary | ICD-10-CM | POA: Diagnosis not present

## 2022-01-29 MED ORDER — ALBUTEROL SULFATE (2.5 MG/3ML) 0.083% IN NEBU
2.5000 mg | INHALATION_SOLUTION | RESPIRATORY_TRACT | Status: DC | PRN
Start: 2022-01-29 — End: 2022-02-02

## 2022-01-29 NOTE — Progress Notes (Signed)
Mobility Specialist - Progress Note   01/29/22 1000  Mobility  Activity Transferred from bed to chair  Level of Assistance +2 (takes two people)  Assistive Device None  Distance Ambulated (ft) 2 ft  Activity Response Tolerated well  $Mobility charge 1 Mobility     Pt lying in bed upon arrival, utilizing RA. Pt able to come into sitting position with modA and elevated HOB. Pt stood with minA +2 and SPT to recliner with light assist on LLE. Pt left in recliner with alarm set, needs in reach.    Filiberto Pinks Mobility Specialist 01/29/22, 10:43 AM

## 2022-01-29 NOTE — TOC Progression Note (Signed)
Transition of Care [facility]) - Progression Note    Patient Details  Name: Victoria Gross MRN: [id number] Date of Birth: 08-25-1970  [facility] CM/SW Contact  Margarito Liner, LCSW Phone Number: 01/29/2022, 8:46 AM  Clinical Narrative:  PASARR still pending.   Expected Discharge Plan: Skilled Nursing Facility Barriers to Discharge: Continued Medical Work up  Expected Discharge Plan and Services Expected Discharge Plan: Skilled Nursing Facility       Living arrangements for the past 2 months: Single Family Home                                       Social Determinants of Health (SDOH) Interventions    Readmission Risk Interventions    01/24/2022    2:06 PM  Readmission Risk Prevention Plan  Transportation Screening Complete  PCP or Specialist Appt within 3-5 Days Complete  HRI or Home Care Consult Complete  Social Work Consult for Recovery Care Planning/Counseling Complete  Palliative Care Screening Not Applicable  Medication Review Oceanographer) Complete

## 2022-01-29 NOTE — Care Management Important Message (Signed)
Important Message  Patient Details  Name: Victoria Gross MRN: [id number] Date of Birth: 03-18-1971   Medicare Important Message Given:  Yes     Johnell Comings 01/29/2022, 3:25 PM
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
## 2022-01-30 DIAGNOSIS — N179 Acute kidney failure, unspecified: Secondary | ICD-10-CM | POA: Diagnosis not present

## 2022-01-30 LAB — CREATININE, SERUM
Creatinine, Ser: 0.82 mg/dL (ref 0.44–1.00)
GFR, Estimated: >60 mL/min (ref >60)

## 2022-01-30 NOTE — Progress Notes (Signed)
End of shift note:  There were not any significant during this shift. Pt was given schedule medications along with prn pain medication. VSS. Pt is waiting on authorization to be approve for rehab.

## 2022-01-30 NOTE — Progress Notes (Signed)
Mobility Specialist - Progress Note   01/30/22 1000  Mobility  Activity Transferred from bed to chair  Range of Motion/Exercises Active  Level of Assistance +2 (takes two people)  Assistive Device None  Distance Ambulated (ft) 4 ft  Activity Response Tolerated well  $Mobility charge 1 Mobility     Pt lying in bed upon arrival, utilizing RA. Pt able to sit EOB with min-modA. No pain this date. Pt stood and transferred to chair with +2 assist, foot blocking. Pt sat in recliner and participated in LE therex with AAROM on LLE. Alarm set, needs in reach.    Filiberto Pinks Mobility Specialist 01/30/22, 10:34 AM
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
## 2022-01-30 NOTE — TOC Progression Note (Addendum)
Transition of Care [facility]) - Progression Note    Patient Details  Name: Victoria Gross MRN: [id number] Date of Birth: [birth year]  Transition of Care [facility]) CM/SW Contact  Margarito Liner, LCSW Phone Number: 01/30/2022, 8:22 AM  Clinical Narrative:   PASARR still pending.  10:56 am: PASARR obtained: [id number] F. Expires 8/6. Previous Berkley Harvey was cancelled. Restarted.  3:23 pm: Auth still pending. Confirmed [facility] accepts weekend admissions. Updated patient and her friend [name].  Expected Discharge Plan: Skilled Nursing Facility Barriers to Discharge: Continued Medical Work up  Expected Discharge Plan and Services Expected Discharge Plan: Skilled Nursing Facility       Living arrangements for the past 2 months: Single Family Home                                       Social Determinants of Health (SDOH) Interventions    Readmission Risk Interventions    01/24/2022    2:06 PM  Readmission Risk Prevention Plan  Transportation Screening Complete  PCP or Specialist Appt within 3-5 Days Complete  HRI or Home Care Consult Complete  Social Work Consult for Recovery Care Planning/Counseling Complete  Palliative Care Screening Not Applicable  Medication Review [employer]) Complete
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
## 2022-01-31 DIAGNOSIS — M545 Low back pain, unspecified: Secondary | ICD-10-CM

## 2022-01-31 DIAGNOSIS — G43909 Migraine, unspecified, not intractable, without status migrainosus: Secondary | ICD-10-CM

## 2022-01-31 DIAGNOSIS — R600 Localized edema: Secondary | ICD-10-CM | POA: Diagnosis not present

## 2022-01-31 DIAGNOSIS — R531 Weakness: Secondary | ICD-10-CM

## 2022-01-31 DIAGNOSIS — L739 Follicular disorder, unspecified: Secondary | ICD-10-CM | POA: Diagnosis not present

## 2022-01-31 DIAGNOSIS — G40909 Epilepsy, unspecified, not intractable, without status epilepticus: Secondary | ICD-10-CM

## 2022-01-31 DIAGNOSIS — N179 Acute kidney failure, unspecified: Secondary | ICD-10-CM | POA: Diagnosis not present

## 2022-01-31 DIAGNOSIS — N390 Urinary tract infection, site not specified: Secondary | ICD-10-CM

## 2022-01-31 DIAGNOSIS — G629 Polyneuropathy, unspecified: Secondary | ICD-10-CM

## 2022-01-31 MED ORDER — HYDROCODONE-ACETAMINOPHEN 5-325 MG PO TABS
1.0000 | ORAL_TABLET | Freq: Four times a day (QID) | ORAL | Status: DC | PRN
  Administered 2022-01-31 – 2022-02-02 (×5): 1 via ORAL
  Filled 2022-01-31 (×6): qty 1

## 2022-01-31 MED ORDER — DOXYCYCLINE HYCLATE 100 MG PO TABS
100.0000 mg | ORAL_TABLET | Freq: Two times a day (BID) | ORAL | Status: DC
  Administered 2022-01-31 – 2022-02-02 (×5): 100 mg via ORAL
  Filled 2022-01-31 (×5): qty 1

## 2022-01-31 NOTE — Assessment & Plan Note (Signed)
Continue home gabapentin ?

## 2022-01-31 NOTE — Assessment & Plan Note (Signed)
X-rays do not show any fracture.  Out of bed to chair. Prn pain meds.  Continue working with physical therapy.  Physical therapy recommending rehab.

## 2022-01-31 NOTE — Assessment & Plan Note (Addendum)
Cystic area left-sided chest under armpit.  Continue warm soaks and doxycycline.  Not having any pain at that site currently.

## 2022-01-31 NOTE — Assessment & Plan Note (Signed)
As needed Fioricet. 
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
## 2022-01-31 NOTE — Assessment & Plan Note (Signed)
PT and OT recommending rehab

## 2022-01-31 NOTE — Assessment & Plan Note (Signed)
Patient on gabapentin

## 2022-01-31 NOTE — Assessment & Plan Note (Signed)
Continue home Cymbalta 

## 2022-01-31 NOTE — Assessment & Plan Note (Signed)
Continue pressure wraps on a weekly basis

## 2022-01-31 NOTE — Progress Notes (Signed)
Dressing to left leg changed. Leg was washed and dried and new dressing applied. Patient tolerated well. Anselm Jungling

## 2022-01-31 NOTE — Assessment & Plan Note (Signed)
BMI 40.39

## 2022-01-31 NOTE — Assessment & Plan Note (Signed)
Completed treatment.

## 2022-01-31 NOTE — Assessment & Plan Note (Signed)
Presented with creatinine 1.28 improved down to 0.82
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
## 2022-01-31 NOTE — Assessment & Plan Note (Signed)
Potassium replaced into the normal range ?
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
## 2022-01-31 NOTE — Assessment & Plan Note (Signed)
Continue bowel regimen. ?

## 2022-02-01 ENCOUNTER — Inpatient Hospital Stay: Payer: Medicare Other

## 2022-02-01 ENCOUNTER — Encounter (INDEPENDENT_AMBULATORY_CARE_PROVIDER_SITE_OTHER): Payer: Self-pay | Admitting: Nurse Practitioner

## 2022-02-01 DIAGNOSIS — F419 Anxiety disorder, unspecified: Secondary | ICD-10-CM

## 2022-02-01 DIAGNOSIS — N179 Acute kidney failure, unspecified: Secondary | ICD-10-CM | POA: Diagnosis not present

## 2022-02-01 DIAGNOSIS — K5900 Constipation, unspecified: Secondary | ICD-10-CM

## 2022-02-01 DIAGNOSIS — N3001 Acute cystitis with hematuria: Secondary | ICD-10-CM

## 2022-02-01 DIAGNOSIS — M545 Low back pain, unspecified: Secondary | ICD-10-CM | POA: Diagnosis not present

## 2022-02-01 DIAGNOSIS — R600 Localized edema: Secondary | ICD-10-CM | POA: Diagnosis not present
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
## 2022-02-01 NOTE — Progress Notes (Signed)
Patent was being transferred from the bed to the chair on 01/31/2022.  Gate belt was on and OT and myself present.  Recliner chair was in locked position.  When patient used the chair for leveralge it was pushed away from the bed and we were unable to get the patient back in to the bed or the chair.  Patient lowered onto floor, she did not hit her back on the chair as it was out of reach.  Lift used to place patient in chair.
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
## 2022-02-01 NOTE — TOC Progression Note (Addendum)
Transition of Care [facility]) - Progression Note    Patient Details  Name: Victoria Gross MRN: [id number] Date of Birth: October 29, 1970  [facility] CM/SW Contact  Bing Quarry, RN Phone Number: 02/01/2022, 9:57 AM  Clinical Narrative:  7/9: Insurance Authorization is still pending in [place]. Call into to [facility] for transport to [facility] at [address] [place], [place] at [phone]. Patient was put on the list but CM also asked if it does not work can we schedule a time for Monday when they have more trucks available. Awaiting call back. Notified Revonda Standard [phone]) at facility of authorizations still pending and transport issues. Can take today or first thing tomorrow. Gabriel Cirri RN CM   Update: Message in [place] portal asking if "lower level of care more appropriate to meet CMS criteria of tolerating 1-2 hours of therapy per day" (paraphrased) Message returned that patient was approved earlier last week but it expired/resubmitted 01/30/22, and to contact weekday CSW CM with phone number left in message. Notified provider. PT to see again Monday am. Gabriel Cirri RN CM     Expected Discharge Plan: Skilled Nursing Facility Barriers to Discharge: Continued Medical Work up  Expected Discharge Plan and Services Expected Discharge Plan: Skilled Nursing Facility       Living arrangements for the past 2 months: Single Family Home                                       Social Determinants of Health (SDOH) Interventions    Readmission Risk Interventions    01/24/2022    2:06 PM  Readmission Risk Prevention Plan  Transportation Screening Complete  PCP or Specialist Appt within 3-5 Days Complete  HRI or Home Care Consult Complete  Social Work Consult for Recovery Care Planning/Counseling Complete  Palliative Care Screening Not Applicable  Medication Review [employer]) Complete

## 2022-02-02 ENCOUNTER — Encounter (INDEPENDENT_AMBULATORY_CARE_PROVIDER_SITE_OTHER): Payer: Medicare Other

## 2022-02-02 DIAGNOSIS — F419 Anxiety disorder, unspecified: Secondary | ICD-10-CM | POA: Diagnosis not present

## 2022-02-02 DIAGNOSIS — N179 Acute kidney failure, unspecified: Secondary | ICD-10-CM | POA: Diagnosis not present

## 2022-02-02 DIAGNOSIS — M545 Low back pain, unspecified: Secondary | ICD-10-CM | POA: Diagnosis not present

## 2022-02-02 DIAGNOSIS — R600 Localized edema: Secondary | ICD-10-CM | POA: Diagnosis not present

## 2022-02-02 IMAGING — CR DG THORACIC SPINE 2V
1 series · 2 of 2 positions shown · non-contrast
Comparison: None.

CLINICAL DATA: Back pain, no known injury, initial encounter

EXAM:
THORACIC SPINE 2 VIEWS

[Series 1: dg thoracic spine 2 view · 0.14mm/px · 2 of 2 slices shown]
[im 1/2]
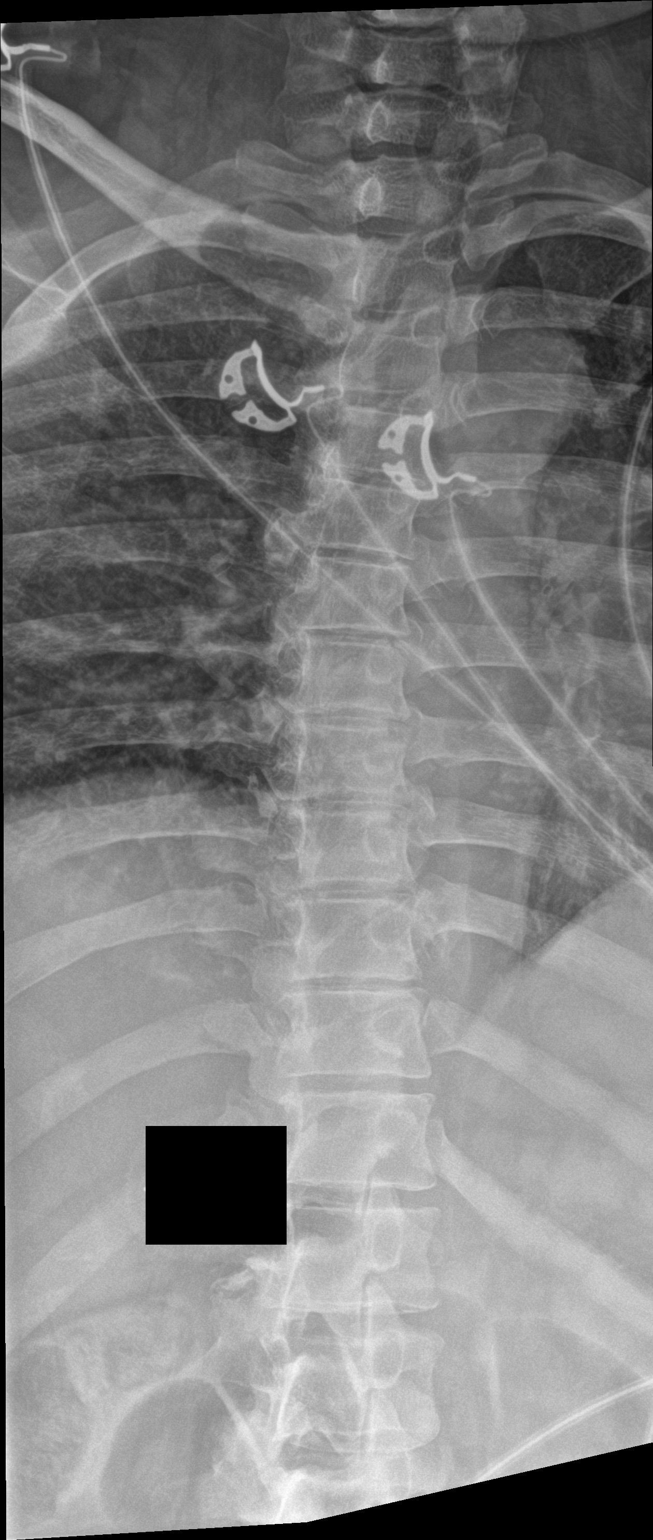
[im 2/2]
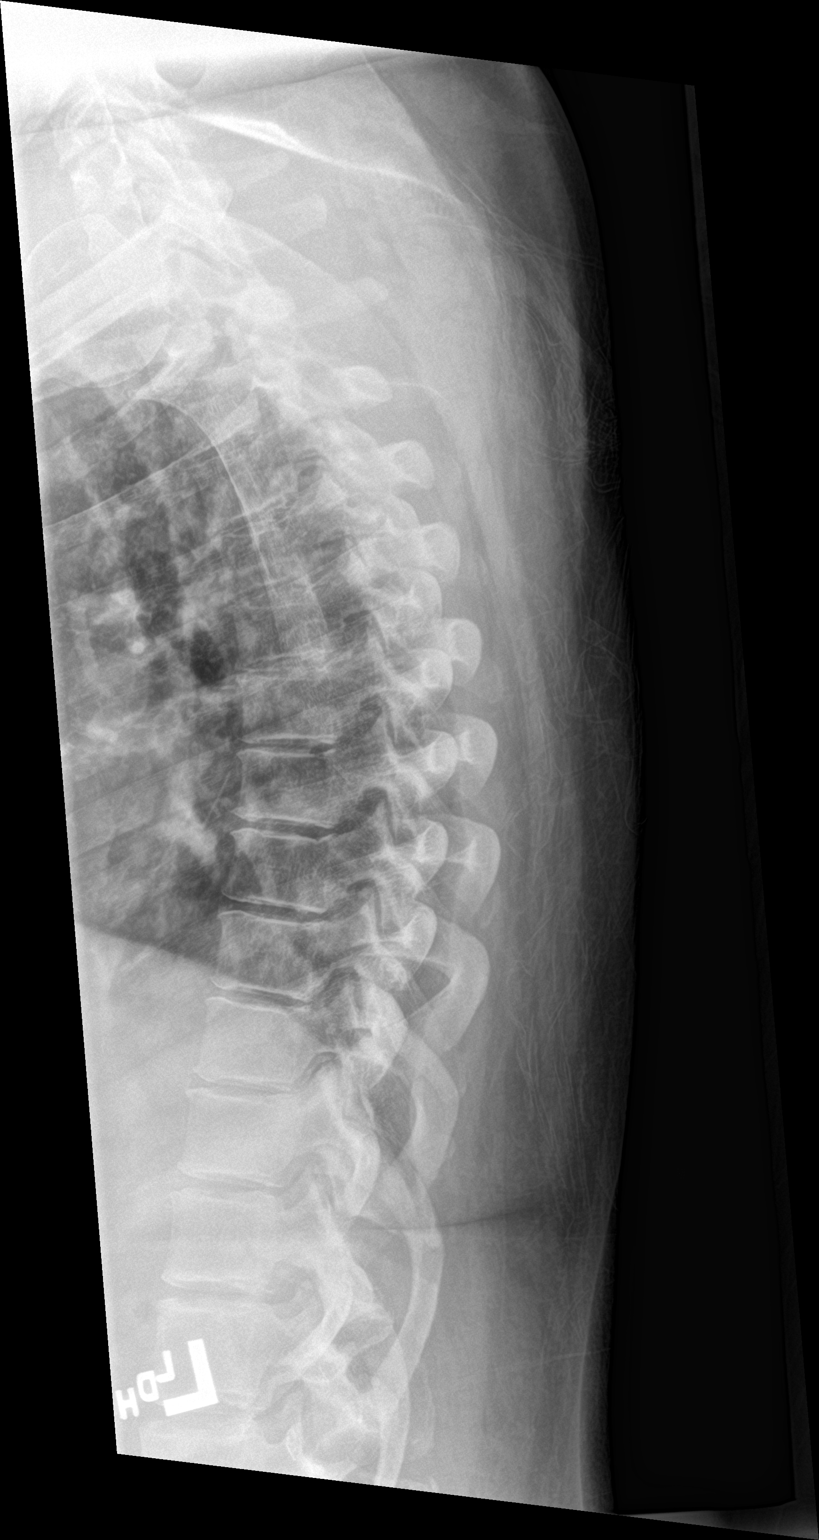

[2 of 2 positions shown; findings below may reference images not displayed]

FINDINGS: Vertebral body height is well maintained. Mild osteophytic changes
are seen. No paraspinal mass or pedicle abnormality is noted.
IMPRESSION: Mild degenerative change without acute abnormality.

## 2022-02-02 MED ORDER — DOXYCYCLINE HYCLATE 100 MG PO TABS: 100.0000 mg | ORAL_TABLET | Freq: Two times a day (BID) | ORAL | 0 refills | Status: AC

## 2022-02-02 MED ORDER — SENNOSIDES-DOCUSATE SODIUM 8.6-50 MG PO TABS: 1.0000 | ORAL_TABLET | Freq: Two times a day (BID) | ORAL | 0 refills | Status: AC

## 2022-02-02 MED ORDER — FOLIC ACID 1 MG PO TABS: 1.0000 mg | ORAL_TABLET | Freq: Every day | ORAL | 0 refills | Status: AC

## 2022-02-02 MED ORDER — SUMATRIPTAN SUCCINATE 50 MG PO TABS: 50.0000 mg | ORAL_TABLET | Freq: Every day | ORAL | 0 refills | Status: AC | PRN

## 2022-02-02 MED ORDER — ADULT MULTIVITAMIN W/MINERALS CH: 1.0000 | ORAL_TABLET | Freq: Every day | ORAL | Status: AC

## 2022-02-02 MED ORDER — LIDOCAINE 5 % EX PTCH
1.0000 | MEDICATED_PATCH | CUTANEOUS | 0 refills | Status: DC
Start: 2022-02-02 — End: 2023-05-03

## 2022-02-02 MED ORDER — POLYETHYLENE GLYCOL 3350 17 G PO PACK: 17.0000 g | PACK | Freq: Every day | ORAL | 0 refills | Status: AC

## 2022-02-02 MED ORDER — THIAMINE HCL 100 MG PO TABS
100.0000 mg | ORAL_TABLET | Freq: Every day | ORAL | 0 refills | Status: AC
Start: 2022-02-03 — End: ?

## 2022-02-02 MED ORDER — BACLOFEN 10 MG PO TABS
10.0000 mg | ORAL_TABLET | Freq: Three times a day (TID) | ORAL | 0 refills | Status: AC
Start: 2022-02-02 — End: ?

## 2022-02-02 MED ORDER — HYDROCODONE-ACETAMINOPHEN 5-325 MG PO TABS: 1.0000 | ORAL_TABLET | Freq: Four times a day (QID) | ORAL | 0 refills | Status: DC | PRN

## 2022-02-02 MED ORDER — GERHARDT'S BUTT CREAM: 1.0000 | TOPICAL_CREAM | Freq: Three times a day (TID) | CUTANEOUS | 0 refills | Status: AC
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
## 2022-02-02 NOTE — TOC Transition Note (Signed)
Transition of Care [facility]) - CM/SW Discharge Note   Patient Details  Name: Victoria Gross MRN: [id number] Date of Birth: 1970/08/05  Transition of Care [facility]) CM/SW Contact:  Margarito Liner, LCSW Phone Number: 02/02/2022, 12:30 PM   Clinical Narrative:  Patient has orders to discharge to [facility] SN[facility] today. RN will call report to [phone] (Room 503B). EMS transport has been arranged for 1:00. No further concerns. CSW signing off.   Final next level of care: Skilled Nursing Facility Barriers to Discharge: Barriers Resolved   Patient Goals and CMS Choice Patient states their goals for this hospitalization and ongoing recovery are:: SNF CMS Medicare.gov Compare Post Acute Care list provided to:: Patient Choice offered to / list presented to : Patient  Discharge Placement PASRR number recieved: 01/30/22            Patient chooses bed at: [facility] Patient to be transferred to facility by: EMS Name of family member notified: [name] Ward Patient and family notified of of transfer: 02/02/22  Discharge Plan and Services                                     Social Determinants of Health (SDOH) Interventions     Readmission Risk Interventions    01/24/2022    2:06 PM  Readmission Risk Prevention Plan  Transportation Screening Complete  PCP or Specialist Appt within 3-5 Days Complete  HRI or Home Care Consult Complete  Social Work Consult for Recovery Care Planning/Counseling Complete  Palliative Care Screening Not Applicable  Medication Review [employer]) Complete

## 2022-02-02 NOTE — Care Management Important Message (Signed)
Important Message  Patient Details  Name: Victoria Gross MRN: [id number] Date of Birth: 1971/04/18   Medicare Important Message Given:  Yes     Johnell Comings 02/02/2022, 11:52 AM

## 2022-02-02 NOTE — Progress Notes (Signed)
Pt discharged to [facility] via [facility] in stable condition. Report called to Marchelle Folks RN at facility.  No immediate questions or concerns at this time. Pt declined placement of new Lidocaine patch.

## 2022-02-02 NOTE — Progress Notes (Signed)
Mobility Specialist - Progress Note   02/02/22 1100  Mobility  Activity Transferred from bed to chair  Level of Assistance +2 (takes two people)  Distance Ambulated (ft) 3 ft  Activity Response Tolerated well  $Mobility charge 1 Mobility     Pt lying in bed upon arrival, utilizing RA. Pt able to complete bed mobility with HOB elevated and minA +1 extra time with assistance mostly on LLE. Pt stood and pivoted towards recliner with modA +2 and foot blocking on LLE to prevent sliding. Assist to hip shift into chair. Pt left in chair with alarm set, needs in reach.    Filiberto Pinks Mobility Specialist 02/02/22, 11:34 AM

## 2022-02-02 NOTE — TOC Progression Note (Addendum)
Transition of Care [facility]) - Progression Note    Patient Details  Name: Victoria Gross MRN: [id number] Date of Birth: Jul 20, 1971  [facility]) CM/SW Contact  Margarito Liner, LCSW Phone Number: 02/02/2022, 10:18 AM  Clinical Narrative:   Insurance requesting a peer-to-peer review. Deadline is 1:30. Sent details to MD in secure chat.  11:16 am: Auth approved but auth number hasn't generated yet. Reference # [id number]. Valid 7/10-7/12. Admissions coordinator said we do not need to wait on auth number. Sent secure chat to MD.  Expected Discharge Plan: Skilled Nursing Facility Barriers to Discharge: Continued Medical Work up  Expected Discharge Plan and Services Expected Discharge Plan: Skilled Nursing Facility       Living arrangements for the past 2 months: Single Family Home                                       Social Determinants of Health (SDOH) Interventions    Readmission Risk Interventions    01/24/2022    2:06 PM  Readmission Risk Prevention Plan  Transportation Screening Complete  PCP or Specialist Appt within 3-5 Days Complete  HRI or Home Care Consult Complete  Social Work Consult for Recovery Care Planning/Counseling Complete  Palliative Care Screening Not Applicable  Medication Review Oceanographer) Complete

## 2022-02-09 ENCOUNTER — Encounter (INDEPENDENT_AMBULATORY_CARE_PROVIDER_SITE_OTHER): Payer: Medicare Other

## 2022-02-16 ENCOUNTER — Encounter (INDEPENDENT_AMBULATORY_CARE_PROVIDER_SITE_OTHER): Payer: Medicare Other

## 2022-02-25 ENCOUNTER — Other Ambulatory Visit (INDEPENDENT_AMBULATORY_CARE_PROVIDER_SITE_OTHER): Payer: Self-pay | Admitting: Nurse Practitioner

## 2022-02-25 DIAGNOSIS — M7989 Other specified soft tissue disorders: Secondary | ICD-10-CM

## 2022-02-26 ENCOUNTER — Ambulatory Visit (INDEPENDENT_AMBULATORY_CARE_PROVIDER_SITE_OTHER): Payer: Medicare Other | Admitting: Nurse Practitioner

## 2022-02-26 ENCOUNTER — Encounter (INDEPENDENT_AMBULATORY_CARE_PROVIDER_SITE_OTHER): Payer: Medicare Other

## 2022-02-27 ENCOUNTER — Encounter (INDEPENDENT_AMBULATORY_CARE_PROVIDER_SITE_OTHER): Payer: Medicare Other

## 2022-02-27 ENCOUNTER — Ambulatory Visit (INDEPENDENT_AMBULATORY_CARE_PROVIDER_SITE_OTHER): Payer: Medicare Other | Admitting: Nurse Practitioner

## 2022-04-04 IMAGING — US US EXTREM LOW VENOUS*L*
1 series · 13 of 24 positions shown · non-contrast
Comparison: 03/16/2020

CLINICAL DATA: Left leg swelling



[Series 1: us venous img lower uni left (dvt) · portal-venous · 13 of 28 slices shown]
[im 1/28]
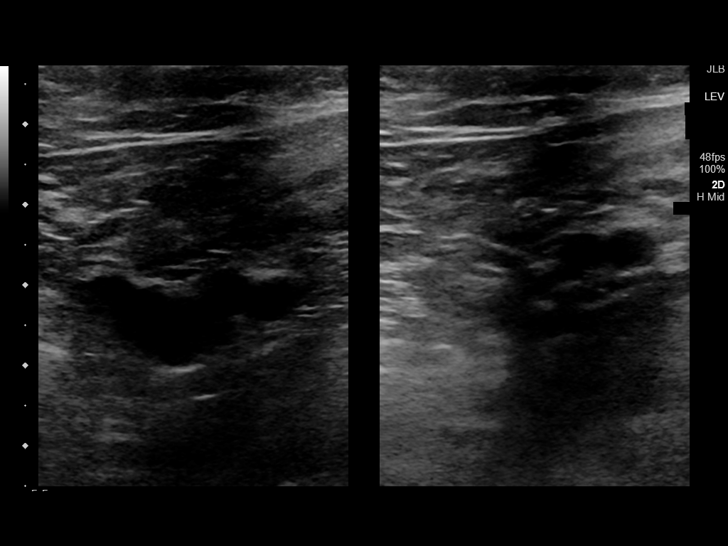
[im 3/28]
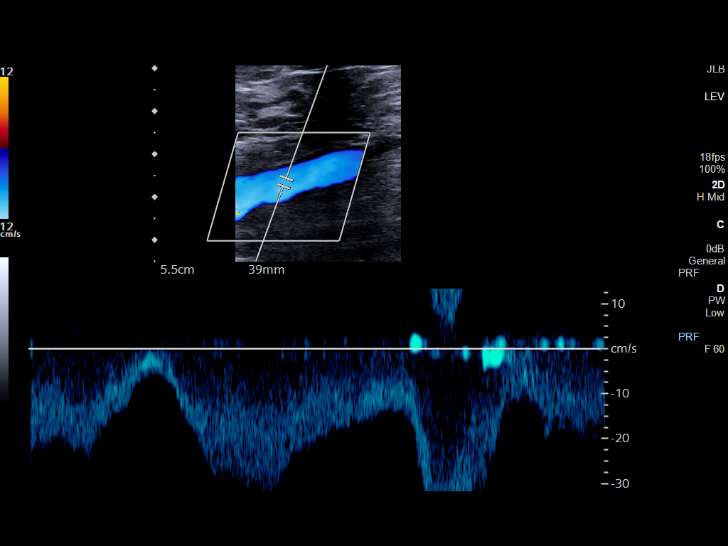
[im 5/28]
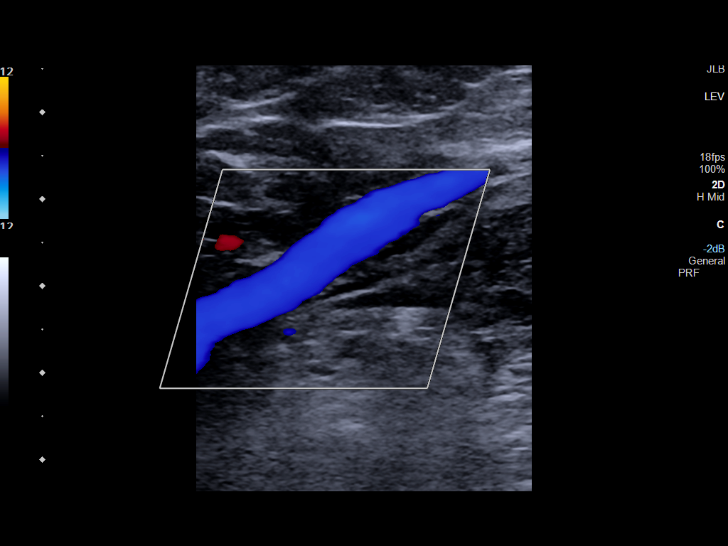
[im 8/28]
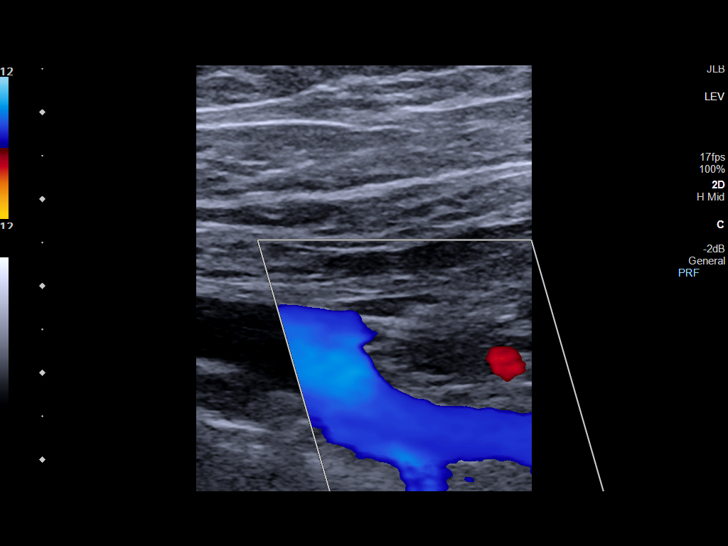
[im 10/28]
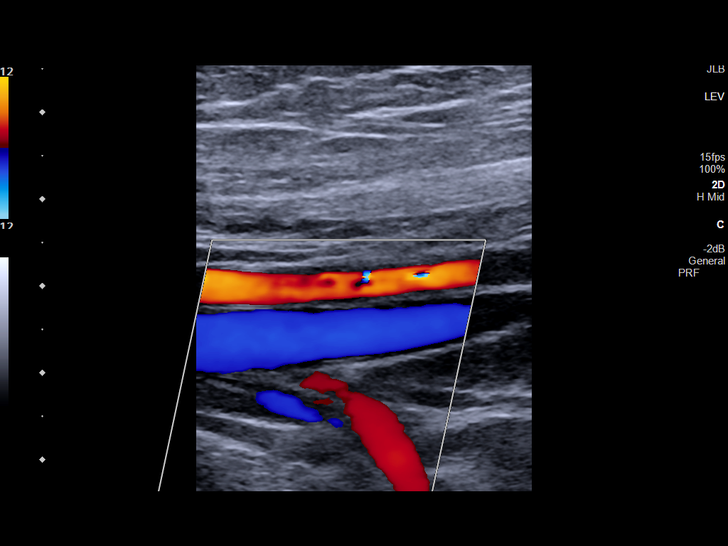
[im 12/28]
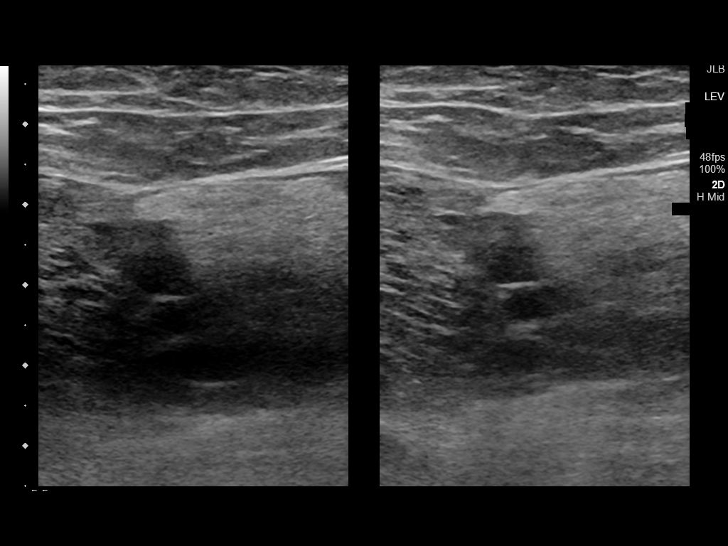
[im 15/28]
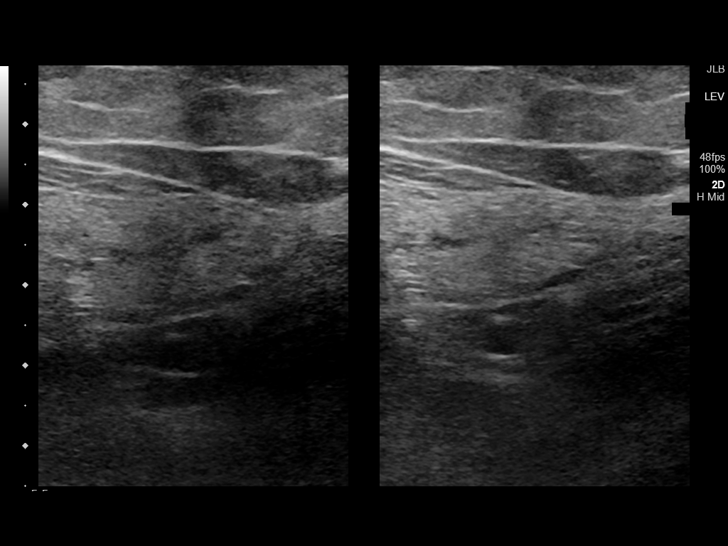
[im 16/28]
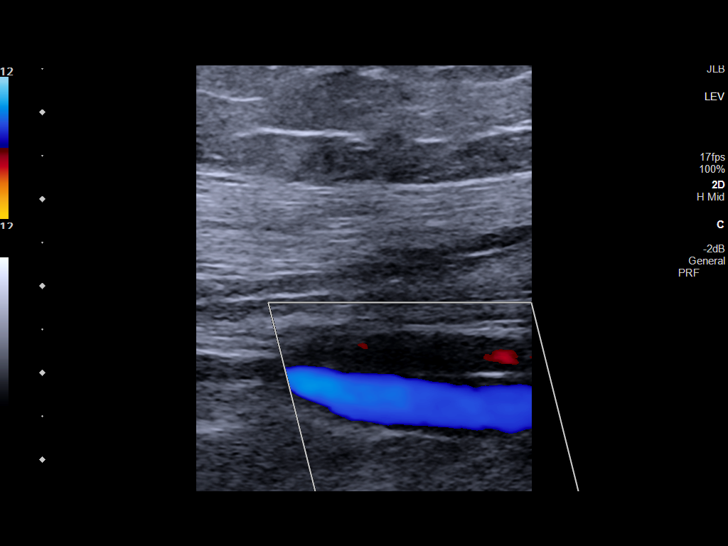
[im 18/28]
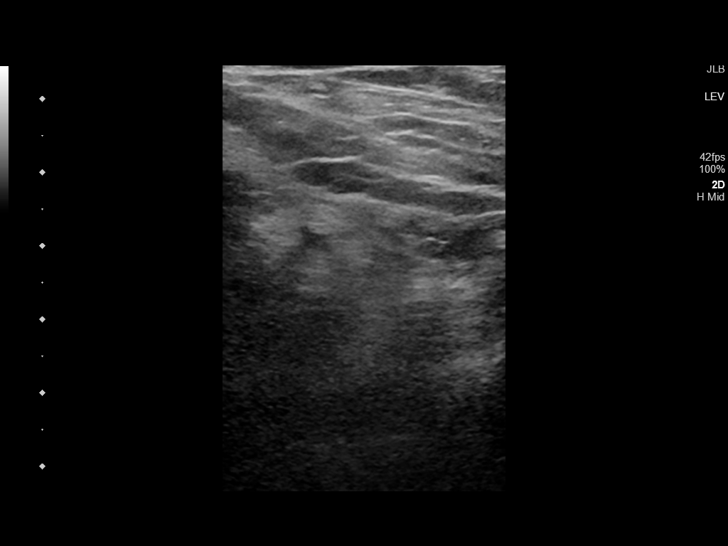
[im 20/28]
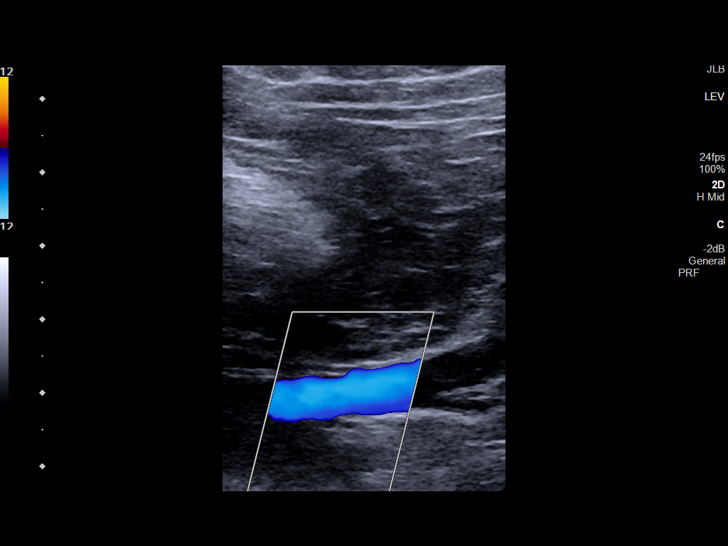
[im 23/28]
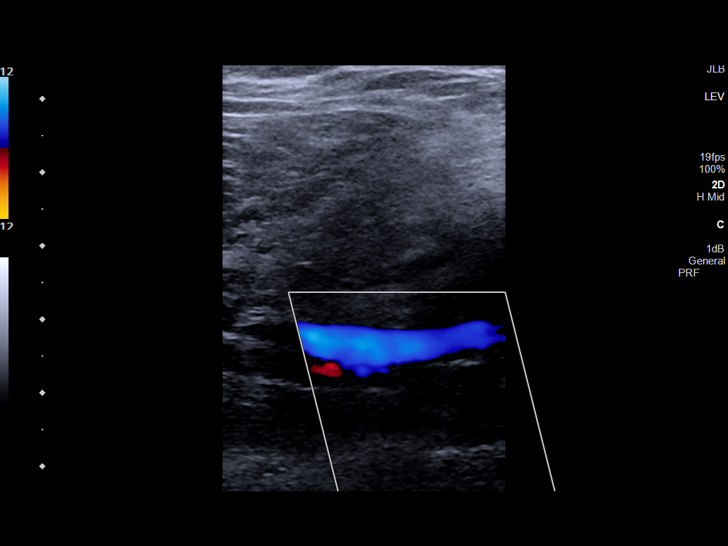
[im 25/28]
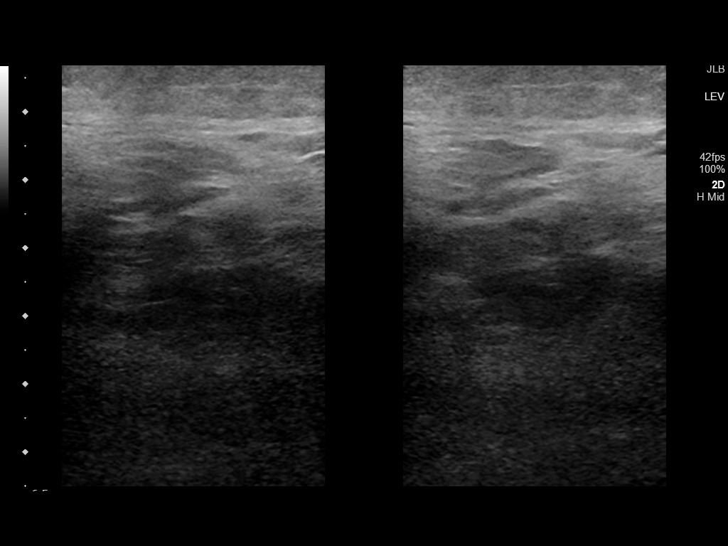
[im 28/28]
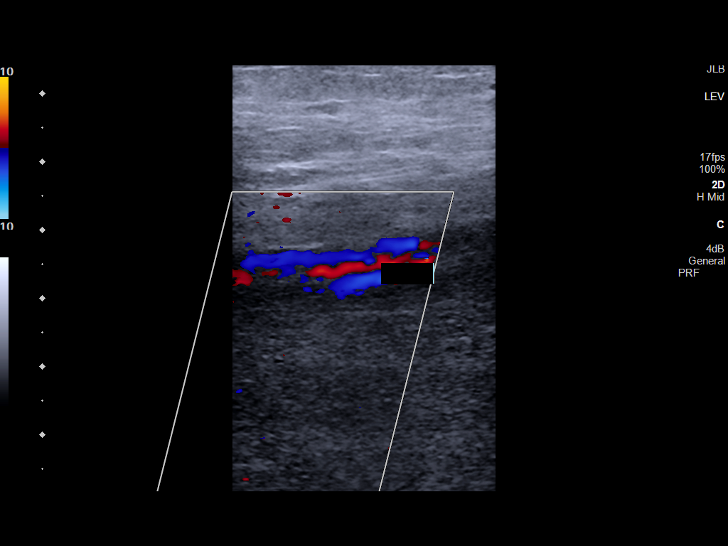

[13 of 24 positions shown; findings below may reference images not displayed]

FINDINGS: Contralateral Common Femoral Vein: Not visualized as patient refused

Common Femoral Vein: No evidence of thrombus. Normal
compressibility, respiratory phasicity and response to augmentation.

Saphenofemoral Junction: No evidence of thrombus. Normal
compressibility and flow on color Doppler imaging.

Profunda Femoral Vein: No evidence of thrombus. Normal
compressibility and flow on color Doppler imaging.

Femoral Vein: No evidence of thrombus. Normal compressibility,
respiratory phasicity and response to augmentation.

Popliteal Vein: No evidence of thrombus. Normal compressibility,
respiratory phasicity and response to augmentation.

Calf Veins: Posterior tibial vein is within normal limits. Peroneal
vein is not well visualized.

Superficial Great Saphenous Vein: No evidence of thrombus. Normal
compressibility.

Venous Reflux:  None.

Other Findings:  None.
IMPRESSION: No evidence of deep venous thrombosis.

## 2022-04-04 IMAGING — DX DG CHEST 1V
1 series · 1 of 1 positions shown · non-contrast
Comparison: 03/16/2020

CLINICAL DATA: Cough

EXAM:
CHEST  1 VIEW

[chest ap]
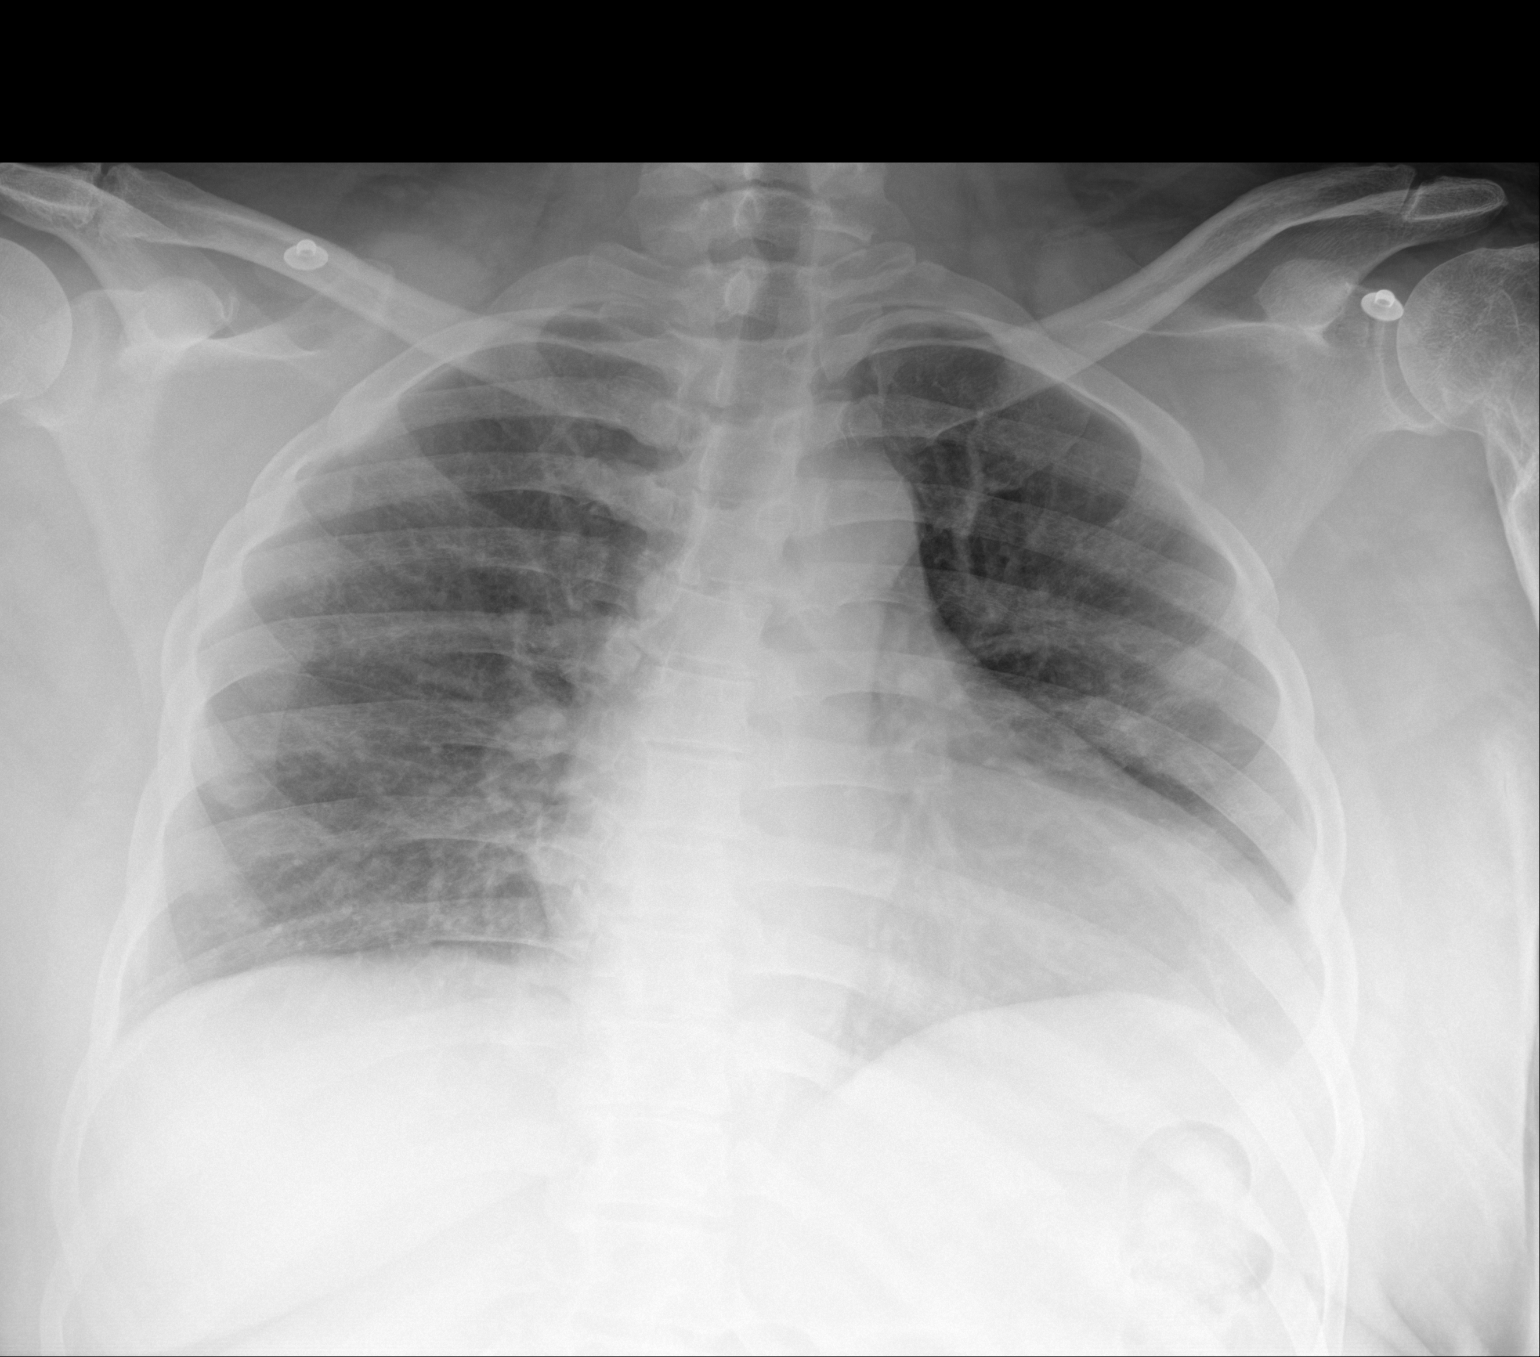

[1 of 1 positions shown; findings below may reference images not displayed]

FINDINGS: Cardiac shadow is within normal limits. The lungs are mildly
hypoinflated. Patchy airspace opacity is noted in the left mid lung
which may represent some early infiltrate. No other focal
abnormality is noted.
IMPRESSION: Likely early infiltrate in the left mid lung.

## 2022-04-24 IMAGING — US US EXTREM LOW VENOUS*L*
1 series · 13 of 24 positions shown · non-contrast
Comparison: CT abdomen pelvis-01/22/2020

CLINICAL DATA: Left lower extremity pain and edema for the past 3
months. History of smoking. Evaluate for DVT.



[Series 1: us venous img lower uni left (dvt) · portal-venous · 13 of 44 slices shown]
[im 1/44]
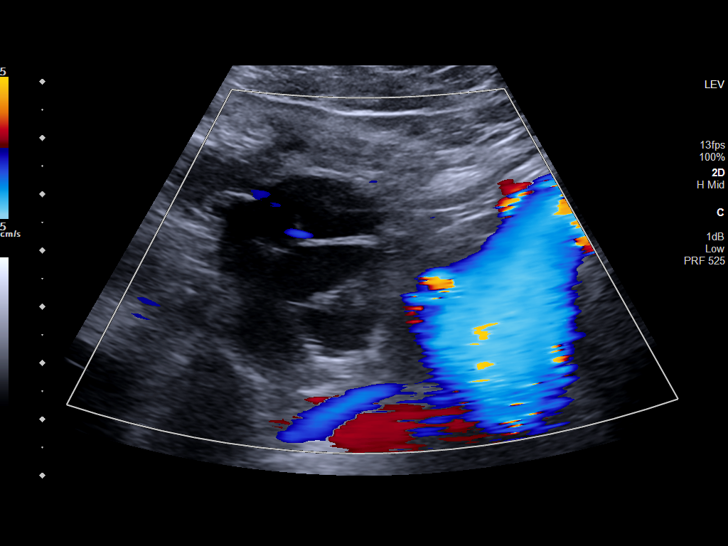
[im 4/44]
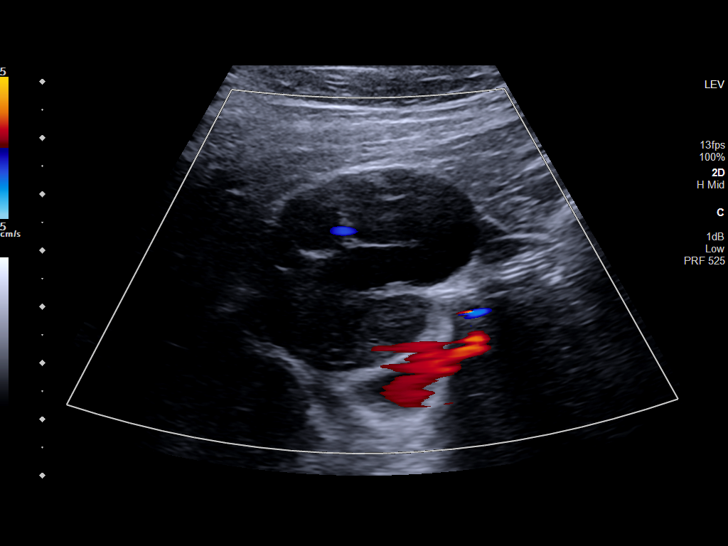
[im 8/44]
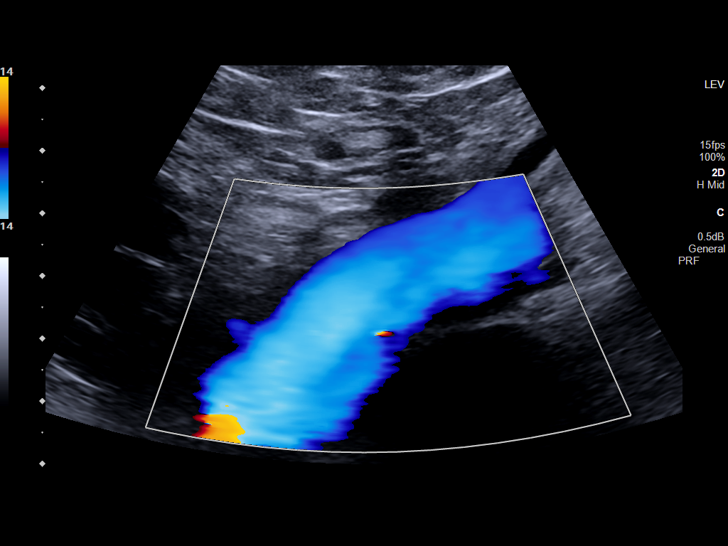
[im 12/44]
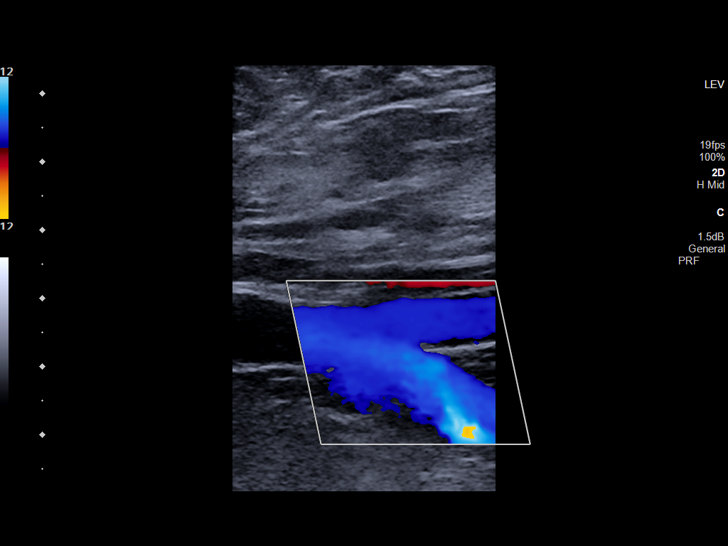
[im 15/44]
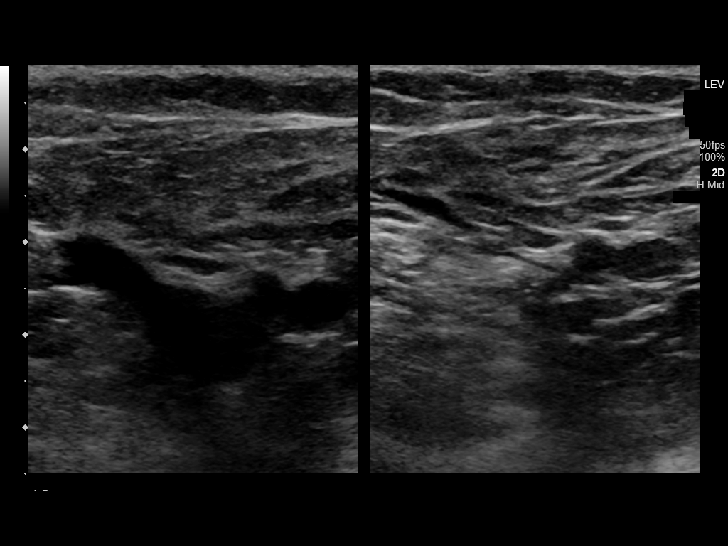
[im 19/44]
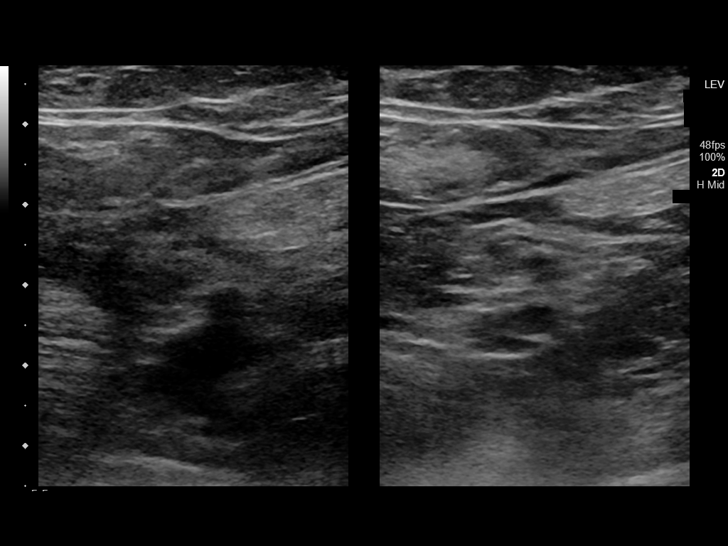
[im 23/44]
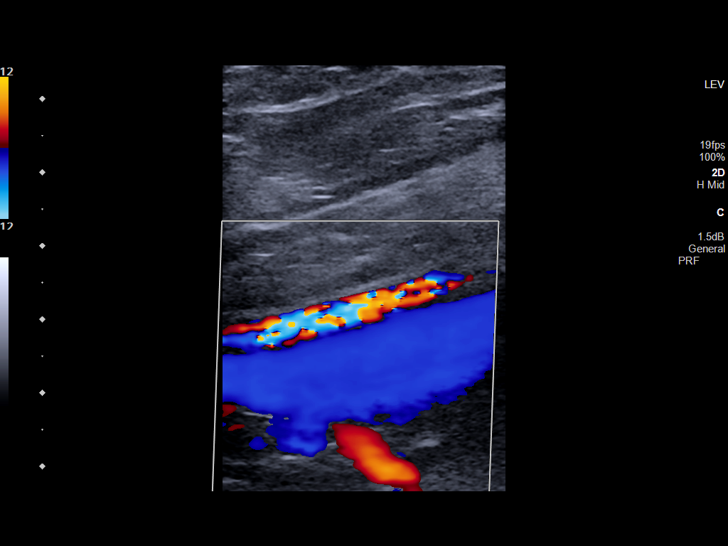
[im 25/44]
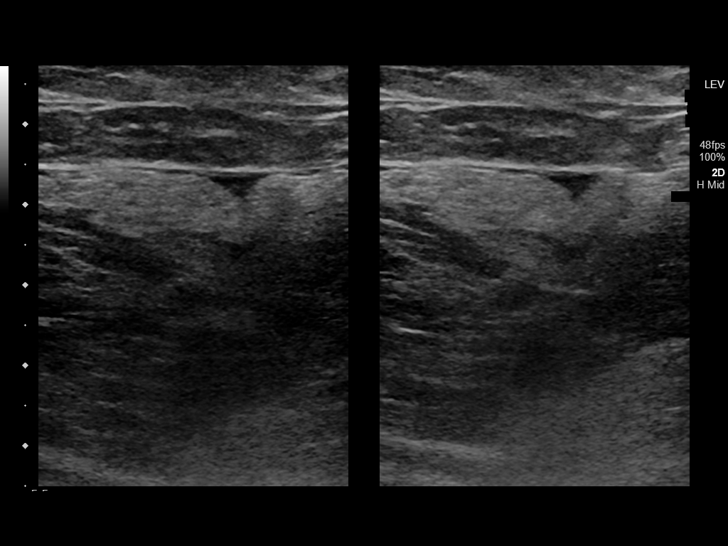
[im 29/44]
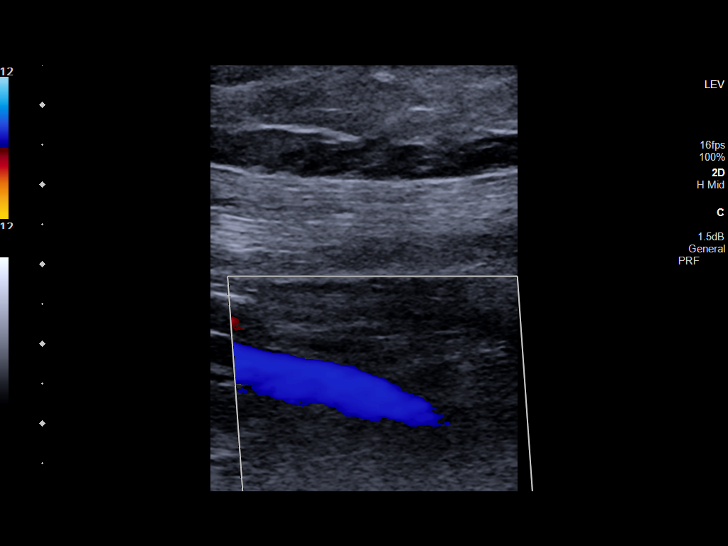
[im 32/44]
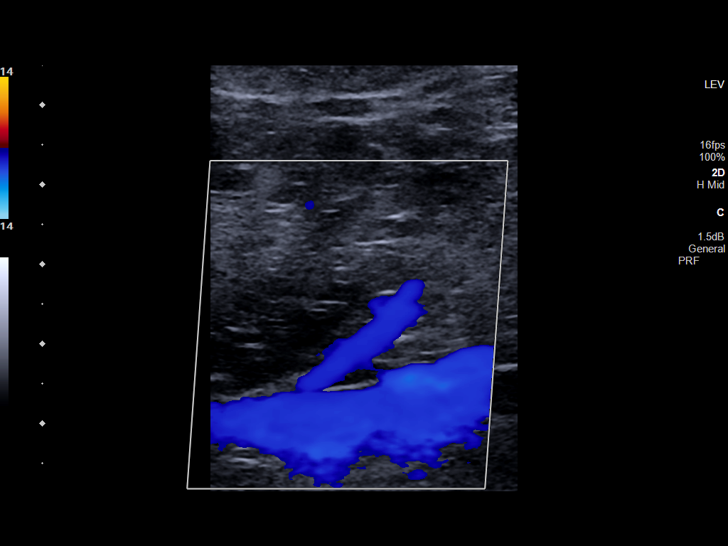
[im 36/44]
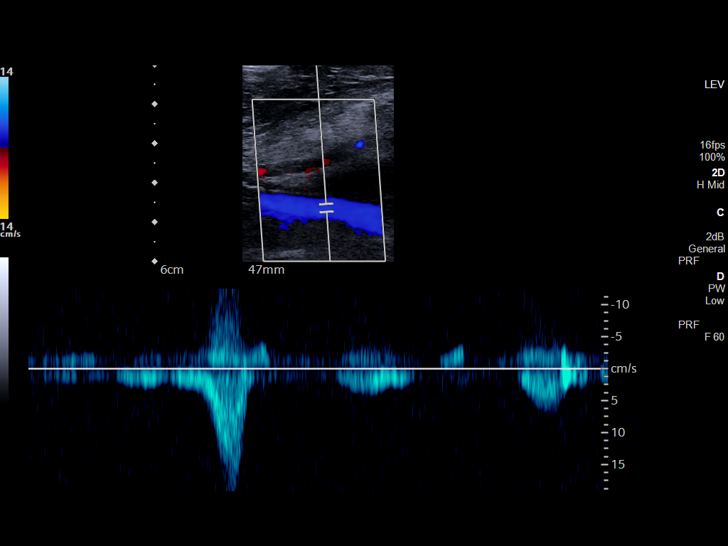
[im 40/44]
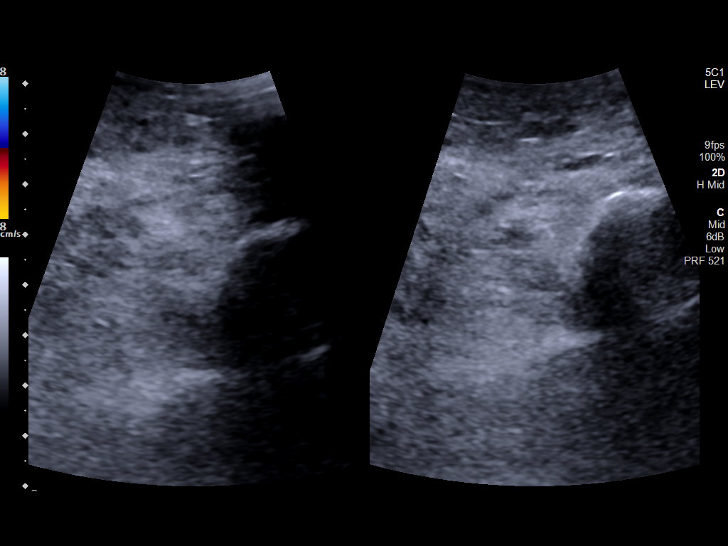
[im 44/44]
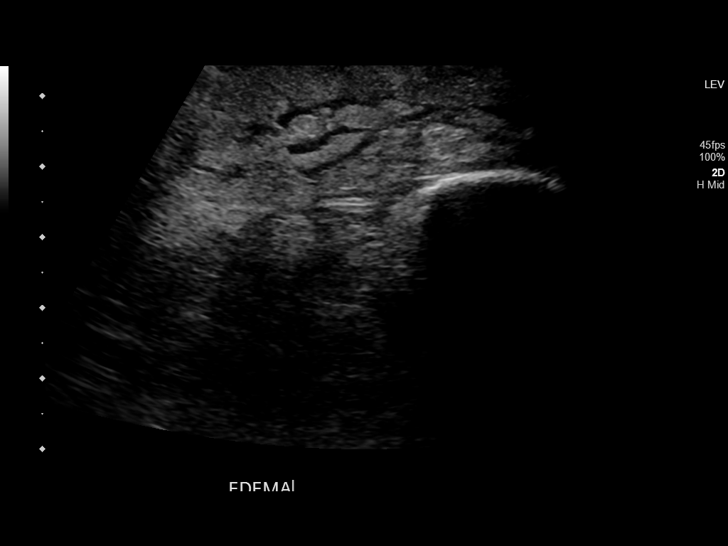

[13 of 24 positions shown; findings below may reference images not displayed]

FINDINGS: Contralateral Common Femoral Vein: Respiratory phasicity is normal
and symmetric with the symptomatic side. No evidence of thrombus.
Normal compressibility.

Common Femoral Vein: No evidence of thrombus. Normal
compressibility, respiratory phasicity and response to augmentation.

Saphenofemoral Junction: No evidence of thrombus. Normal
compressibility and flow on color Doppler imaging.

Profunda Femoral Vein: No evidence of thrombus. Normal
compressibility and flow on color Doppler imaging.

Femoral Vein: No evidence of thrombus. Normal compressibility,
respiratory phasicity and response to augmentation.

Popliteal Vein: No evidence of thrombus. Normal compressibility,
respiratory phasicity and response to augmentation.

Calf Veins: No evidence of thrombus. Normal compressibility and flow
on color Doppler imaging.

Superficial Great Saphenous Vein: No evidence of thrombus. Normal
compressibility.

Venous Reflux:  None.

Other Findings: Incidental note made of an enlarged right inguinal
lymph node which measures approximately 2.1 cm in diameter likely
increased in size compared to abdominal CT performed 01/22/2020.

Minimal amount of subcutaneous edema is noted the level the left
calf.
IMPRESSION: 1. No evidence of DVT within the left lower extremity.
2. Incidental note made of an enlarged right inguinal lymph node,
likely increased in size compared to abdominal CT performed
01/22/2020-nonspecific with differential considerations including
both benign/reactive as well as malignant etiologies. Clinical
correlation is advised. Potential imaging strategies could include
proceeding with CT scan of the abdomen and pelvis versus obtaining a
right groin soft tissue ultrasound in 4-6 weeks as indicated.

## 2022-05-02 ENCOUNTER — Other Ambulatory Visit: Payer: Self-pay

## 2022-05-02 ENCOUNTER — Emergency Department: Payer: Medicare Other

## 2022-05-02 ENCOUNTER — Emergency Department
Admission: EM | Admit: 2022-05-02 | Discharge: 2022-05-02 | Disposition: A | Discharge status: Home / Self Care | Payer: Medicare Other | Attending: Emergency Medicine | Admitting: Emergency Medicine

## 2022-05-02 DIAGNOSIS — L03115 Cellulitis of right lower limb: Secondary | ICD-10-CM

## 2022-05-02 DIAGNOSIS — M79604 Pain in right leg: Secondary | ICD-10-CM | POA: Diagnosis present

## 2022-05-02 DIAGNOSIS — R600 Localized edema: Secondary | ICD-10-CM | POA: Insufficient documentation

## 2022-05-02 DIAGNOSIS — R739 Hyperglycemia, unspecified: Secondary | ICD-10-CM | POA: Diagnosis not present

## 2022-05-02 DIAGNOSIS — R509 Fever, unspecified: Secondary | ICD-10-CM | POA: Insufficient documentation

## 2022-05-02 DIAGNOSIS — I890 Lymphedema, not elsewhere classified: Secondary | ICD-10-CM | POA: Diagnosis not present

## 2022-05-02 LAB — URINALYSIS, COMPLETE (UACMP) WITH MICROSCOPIC
Bilirubin Urine: NEGATIVE
Glucose, UA: NEGATIVE mg/dL
Hgb urine dipstick: NEGATIVE
Ketones, ur: NEGATIVE mg/dL
Leukocytes,Ua: NEGATIVE
Nitrite: NEGATIVE
Protein, ur: NEGATIVE mg/dL
Specific Gravity, Urine: 1.011 (ref 1.005–1.030)
pH: 5 (ref 5.0–8.0)

## 2022-05-02 LAB — BASIC METABOLIC PANEL
Anion gap: 8 (ref 5–15)
BUN: 8 mg/dL (ref 6–20)
CO2: 21 mmol/L — ABNORMAL LOW (ref 22–32)
Calcium: 8.6 mg/dL — ABNORMAL LOW (ref 8.9–10.3)
Chloride: 108 mmol/L (ref 98–111)
Creatinine, Ser: 0.7 mg/dL (ref 0.44–1.00)
GFR, Estimated: >60 mL/min (ref >60)
Glucose, Bld: 107 mg/dL — ABNORMAL HIGH (ref 70–99)
Potassium: 3.1 mmol/L — ABNORMAL LOW (ref 3.5–5.1)
Sodium: 137 mmol/L (ref 135–145)

## 2022-05-02 LAB — HEPATIC FUNCTION PANEL
ALT: 17 U/L (ref 0–44)
AST: 23 U/L (ref 15–41)
Albumin: 4 g/dL (ref 3.5–5.0)
Alkaline Phosphatase: 104 U/L (ref 38–126)
Bilirubin, Direct: <0.1 mg/dL (ref 0.0–0.2)
Total Bilirubin: 0.6 mg/dL (ref 0.3–1.2)
Total Protein: 8.4 g/dL — ABNORMAL HIGH (ref 6.5–8.1)

## 2022-05-02 LAB — BRAIN NATRIURETIC PEPTIDE: B Natriuretic Peptide: 11.2 pg/mL (ref 0.0–100.0)

## 2022-05-02 LAB — CBC
HCT: 39.2 % (ref 36.0–46.0)
Hemoglobin: 11.9 g/dL — ABNORMAL LOW (ref 12.0–15.0)
MCH: 22.8 pg — ABNORMAL LOW (ref 26.0–34.0)
MCHC: 30.4 g/dL (ref 30.0–36.0)
MCV: 75 fL — ABNORMAL LOW (ref 80.0–100.0)
Platelets: 343 10*3/uL (ref 150–400)
RBC: 5.23 MIL/uL — ABNORMAL HIGH (ref 3.87–5.11)
RDW: 17.2 % — ABNORMAL HIGH (ref 11.5–15.5)
WBC: 12.4 10*3/uL — ABNORMAL HIGH (ref 4.0–10.5)
nRBC: 0 % (ref 0.0–0.2)

## 2022-05-02 LAB — CBG MONITORING, ED: Glucose-Capillary: 118 mg/dL — ABNORMAL HIGH (ref 70–99)

## 2022-05-02 MED ORDER — VANCOMYCIN HCL IN DEXTROSE 1-5 GM/200ML-% IV SOLN
1000.0000 mg | Freq: Once | INTRAVENOUS | Status: AC
Start: 2022-05-02 — End: 2022-05-02
  Administered 2022-05-02: 1000 mg via INTRAVENOUS
  Filled 2022-05-02: qty 200

## 2022-05-02 MED ORDER — ACETAMINOPHEN 500 MG PO TABS
1000.0000 mg | ORAL_TABLET | Freq: Once | ORAL | Status: AC
  Administered 2022-05-02: 1000 mg via ORAL
  Filled 2022-05-02: qty 2

## 2022-05-02 MED ORDER — CEPHALEXIN 500 MG PO CAPS: 500.0000 mg | ORAL_CAPSULE | Freq: Four times a day (QID) | ORAL | 0 refills | Status: AC

## 2022-05-02 NOTE — ED Triage Notes (Signed)
Pt to ED via [facility] from home.Pt stating to this RN bilateral foot pain and swelling. Pt keeps falling asleep while this RN is trying to do triage. Pt denies taking anything for pain. Pt denies SOB or CP. CBG in triage 118

## 2022-05-02 NOTE — ED Notes (Signed)
Ems called to transport patient back home

## 2022-05-02 NOTE — ED Notes (Signed)
Pt given water 

## 2022-05-02 NOTE — ED Notes (Signed)
Pt assisted to call friend Otila Kluver to make sure someone can ope door to house at this time.

## 2022-05-02 NOTE — ED Notes (Signed)
Pt cleaned of BM at this time.

## 2022-05-02 NOTE — ED Provider Notes (Signed)
[facility] Provider Note    Event Date/Time   First MD Initiated Contact with Patient 05/02/22 1646     (approximate)   History   Leg Pain and Leg Swelling   HPI  Victoria Gross is a 51 y.o. female  who, per clinic note dated 01/02/2022 has left leg swelling and per vascular note has lymphedema (2+ pitting edema present in both lower extremities per that note) who presents to the emergency department today because of concern for right leg swelling, pain and possible UTI. The patient states that a few days ago she had fallen and scraped her right knee. Since then she has noticed swelling and pain to that leg. In addition roughly 2-3 days ago she started noticing discomfort with urination and bad odor to her urine. She has had low grade fevers.       Physical Exam   Triage Vital Signs: ED Triage Vitals [05/02/22 1437]  Enc Vitals Group     BP (!) 166/91     Pulse Rate 94     Resp 18     Temp 98.5 F (36.9 C)     Temp Source Oral     SpO2 99 %     Weight      Height      Head Circumference      Peak Flow      Pain Score      Pain Loc      Pain Edu?      Excl. in GC?     Most recent vital signs: Vitals:   05/02/22 1437  BP: (!) 166/91  Pulse: 94  Resp: 18  Temp: 98.5 F (36.9 C)  SpO2: 99%   General: Awake, alert, oriented. CV:  Good peripheral perfusion. Regular rate and rhythm. Resp:  Normal effort. Lungs clear. Abd:  No distention.  Other:  Bilateral lower extremity edema. Tender to palpation of the right lower leg.   ED Results / Procedures / Treatments   Labs (all labs ordered are listed, but only abnormal results are displayed) Labs Reviewed  BASIC METABOLIC PANEL - Abnormal; Notable for the following components:      Result Value   Potassium 3.1 (*)    CO2 21 (*)    Glucose, Bld 107 (*)    Calcium 8.6 (*)    All other components within normal limits  CBC - Abnormal; Notable for the following components:   WBC 12.4 (*)     RBC 5.23 (*)    Hemoglobin 11.9 (*)    MCV 75.0 (*)    MCH 22.8 (*)    RDW 17.2 (*)    All other components within normal limits  CBG MONITORING, ED - Abnormal; Notable for the following components:   Glucose-Capillary 118 (*)    All other components within normal limits  BRAIN NATRIURETIC PEPTIDE  HEPATIC FUNCTION PANEL     EKG  None   RADIOLOGY I independently interpreted and visualized the CXR. My interpretation: No pneumonia Radiology interpretation:  IMPRESSION:  Limited exam due to left arm/hand positioning. Within this  limitation, no acute cardiopulmonary abnormality.   [place] RLE IMPRESSION:  There is no evidence of deep venous thrombosis in right lower  extremity.     PROCEDURES:  Critical Care performed: No  Procedures   MEDICATIONS ORDERED IN ED: Medications - No data to display   IMPRESSION / MDM / ASSESSMENT AND PLAN / ED COURSE  I reviewed the triage  vital signs and the nursing notes.                              Differential diagnosis includes, but is not limited to, blood clot, celluitis, lymphedema, UTI  Patient's presentation is most consistent with acute presentation with potential threat to life or bodily function.  Patient presents to the emergency department today because of concern for right leg swelling and pain. [place] without evidence of DVT. On exam some warmth to that leg. At this time I do think likely cellulitis. Discussed this with the patient. She was given dose of IV antibiotics here. Patient also had concern for possible UTI. UA without concerning findings. Do think patient can be discharged home with prescription for antibiotics for cellulitis.    FINAL CLINICAL IMPRESSION(S) / ED DIAGNOSES   Final diagnoses:  Cellulitis of right lower extremity      Note:  This document was prepared using Dragon voice recognition software and may include unintentional dictation errors.    Nance Pear, MD 05/02/22 2052

## 2022-05-02 NOTE — Discharge Instructions (Signed)
Please seek medical attention for any high fevers, chest pain, shortness of breath, change in behavior, persistent vomiting, bloody stool or any other new or concerning symptoms.  

## 2022-05-02 NOTE — ED Notes (Signed)
Pt cleaned of stool. Purewick changed.

## 2022-05-02 NOTE — ED Notes (Signed)
EMS rolled pt out of room before this RN was notified. Per EMS, they removed the IV.

## 2022-05-02 NOTE — ED Triage Notes (Signed)
Pt BIB EMS for bilateral leg swelling and pain that started 2 days ago. Pt uses electric wheelchair.    CBG 133 155/55 110 99% on RA

## 2022-05-02 NOTE — ED Notes (Signed)
Pt cleaned of 4 urine soaked briefs at this time. Foul odor or old urine noted. Stage 2 skin breakdown noted on right posterior thigh- barrier cream applied. Pt has wrap on left ankle/leg. Swelling noted in lower extremities bilaterally. Pt changed into hospital gown and given several warm blankets and a TV remote. Pt is AOX4, NAD noted.

## 2022-05-02 NOTE — ED Notes (Signed)
PT given hospital socks and repositioned for [place] tech.

## 2022-05-02 NOTE — ED Notes (Signed)
Red top drawn in case needed.

## 2022-05-13 ENCOUNTER — Encounter (INDEPENDENT_AMBULATORY_CARE_PROVIDER_SITE_OTHER): Payer: Medicare Other

## 2022-05-13 ENCOUNTER — Ambulatory Visit (INDEPENDENT_AMBULATORY_CARE_PROVIDER_SITE_OTHER): Payer: Medicare Other | Admitting: Nurse Practitioner

## 2022-05-20 ENCOUNTER — Other Ambulatory Visit: Payer: Self-pay | Admitting: Family Medicine

## 2022-05-20 DIAGNOSIS — N63 Unspecified lump in unspecified breast: Secondary | ICD-10-CM

## 2022-05-26 ENCOUNTER — Encounter: Payer: Self-pay | Admitting: Family Medicine

## 2022-05-31 ENCOUNTER — Emergency Department: Payer: Medicare Other

## 2022-05-31 ENCOUNTER — Emergency Department
Admission: EM | Admit: 2022-05-31 | Discharge: 2022-06-01 | Disposition: A | Discharge status: Home / Self Care | Payer: Medicare Other | Attending: Emergency Medicine | Admitting: Emergency Medicine

## 2022-05-31 ENCOUNTER — Other Ambulatory Visit: Payer: Self-pay

## 2022-05-31 DIAGNOSIS — R0789 Other chest pain: Secondary | ICD-10-CM | POA: Insufficient documentation

## 2022-05-31 DIAGNOSIS — I10 Essential (primary) hypertension: Secondary | ICD-10-CM | POA: Diagnosis not present

## 2022-05-31 DIAGNOSIS — G40909 Epilepsy, unspecified, not intractable, without status epilepticus: Secondary | ICD-10-CM | POA: Diagnosis not present

## 2022-05-31 DIAGNOSIS — Z8616 Personal history of COVID-19: Secondary | ICD-10-CM | POA: Diagnosis not present

## 2022-05-31 DIAGNOSIS — R0602 Shortness of breath: Secondary | ICD-10-CM | POA: Diagnosis not present

## 2022-05-31 DIAGNOSIS — F4323 Adjustment disorder with mixed anxiety and depressed mood: Secondary | ICD-10-CM | POA: Diagnosis present

## 2022-05-31 DIAGNOSIS — Z20822 Contact with and (suspected) exposure to covid-19: Secondary | ICD-10-CM | POA: Insufficient documentation

## 2022-05-31 DIAGNOSIS — R079 Chest pain, unspecified: Secondary | ICD-10-CM

## 2022-05-31 LAB — CBC WITH DIFFERENTIAL/PLATELET
Abs Immature Granulocytes: 0.03 10*3/uL (ref 0.00–0.07)
Basophils Absolute: 0 10*3/uL (ref 0.0–0.1)
Basophils Relative: 0 %
Eosinophils Absolute: 0.1 10*3/uL (ref 0.0–0.5)
Eosinophils Relative: 2 %
HCT: 41.4 % (ref 36.0–46.0)
Hemoglobin: 12.9 g/dL (ref 12.0–15.0)
Immature Granulocytes: 0 %
Lymphocytes Relative: 26 %
Lymphs Abs: 1.8 10*3/uL (ref 0.7–4.0)
MCH: 23.2 pg — ABNORMAL LOW (ref 26.0–34.0)
MCHC: 31.2 g/dL (ref 30.0–36.0)
MCV: 74.6 fL — ABNORMAL LOW (ref 80.0–100.0)
Monocytes Absolute: 0.7 10*3/uL (ref 0.1–1.0)
Monocytes Relative: 10 %
Neutro Abs: 4.3 10*3/uL (ref 1.7–7.7)
Neutrophils Relative %: 62 %
Platelets: 339 10*3/uL (ref 150–400)
RBC: 5.55 MIL/uL — ABNORMAL HIGH (ref 3.87–5.11)
RDW: 17.9 % — ABNORMAL HIGH (ref 11.5–15.5)
WBC: 6.9 10*3/uL (ref 4.0–10.5)
nRBC: 0 % (ref 0.0–0.2)

## 2022-05-31 LAB — COMPREHENSIVE METABOLIC PANEL
ALT: 16 U/L (ref 0–44)
AST: 23 U/L (ref 15–41)
Albumin: 4.4 g/dL (ref 3.5–5.0)
Alkaline Phosphatase: 102 U/L (ref 38–126)
Anion gap: 9 (ref 5–15)
BUN: 14 mg/dL (ref 6–20)
CO2: 26 mmol/L (ref 22–32)
Calcium: 8.9 mg/dL (ref 8.9–10.3)
Chloride: 108 mmol/L (ref 98–111)
Creatinine, Ser: 0.97 mg/dL (ref 0.44–1.00)
GFR, Estimated: >60 mL/min (ref >60)
Glucose, Bld: 106 mg/dL — ABNORMAL HIGH (ref 70–99)
Potassium: 4.1 mmol/L (ref 3.5–5.1)
Sodium: 143 mmol/L (ref 135–145)
Total Bilirubin: 0.4 mg/dL (ref 0.3–1.2)
Total Protein: 9.1 g/dL — ABNORMAL HIGH (ref 6.5–8.1)

## 2022-05-31 LAB — ETHANOL: Alcohol, Ethyl (B): <10 mg/dL (ref <10)

## 2022-05-31 LAB — URINALYSIS, ROUTINE W REFLEX MICROSCOPIC
Bilirubin Urine: NEGATIVE
Glucose, UA: NEGATIVE mg/dL
Hgb urine dipstick: NEGATIVE
Ketones, ur: NEGATIVE mg/dL
Leukocytes,Ua: NEGATIVE
Nitrite: NEGATIVE
Protein, ur: NEGATIVE mg/dL
Specific Gravity, Urine: 1.01 (ref 1.005–1.030)
pH: 7 (ref 5.0–8.0)

## 2022-05-31 LAB — LIPASE, BLOOD: Lipase: 38 U/L (ref 11–51)

## 2022-05-31 LAB — D-DIMER, QUANTITATIVE: D-Dimer, Quant: 0.74 ug/mL-FEU — ABNORMAL HIGH (ref 0.00–0.50)

## 2022-05-31 LAB — TROPONIN I (HIGH SENSITIVITY)
Troponin I (High Sensitivity): 6 ng/L (ref <18)
Troponin I (High Sensitivity): 7 ng/L (ref <18)

## 2022-05-31 LAB — RESP PANEL BY RT-PCR (FLU A&B, COVID) ARPGX2
Influenza A by PCR: NEGATIVE
Influenza B by PCR: NEGATIVE
SARS Coronavirus 2 by RT PCR: NEGATIVE

## 2022-05-31 MED ORDER — IOHEXOL 350 MG/ML SOLN
75.0000 mL | Freq: Once | INTRAVENOUS | Status: AC | PRN
  Administered 2022-05-31: 75 mL via INTRAVENOUS

## 2022-05-31 MED ORDER — QUETIAPINE FUMARATE 25 MG PO TABS
50.0000 mg | ORAL_TABLET | Freq: Every day | ORAL | Status: DC
  Administered 2022-05-31: 50 mg via ORAL
  Filled 2022-05-31: qty 2

## 2022-05-31 MED ORDER — OXYCODONE-ACETAMINOPHEN 5-325 MG PO TABS
1.0000 | ORAL_TABLET | Freq: Once | ORAL | Status: AC
  Administered 2022-05-31: 1 via ORAL
  Filled 2022-05-31: qty 1

## 2022-05-31 MED ORDER — DULOXETINE HCL 60 MG PO CPEP
60.0000 mg | ORAL_CAPSULE | Freq: Every day | ORAL | Status: DC
  Administered 2022-05-31 – 2022-06-01 (×2): 60 mg via ORAL
  Filled 2022-05-31 (×3): qty 1

## 2022-05-31 MED ORDER — DULOXETINE HCL 30 MG PO CPEP
30.0000 mg | ORAL_CAPSULE | Freq: Every day | ORAL | Status: DC
  Administered 2022-05-31: 30 mg via ORAL
  Filled 2022-05-31 (×2): qty 1

## 2022-05-31 NOTE — ED Notes (Signed)
Pt arrives soiled with urine, sweater was saturated. With help of RN Caryl Pina, pt cleansed, sheets changed, and pt placed on purewick. Pt tolerated well.

## 2022-05-31 NOTE — Discharge Instructions (Addendum)
Your blood work EKG and CAT scan were all reassuring.  You do not have a blood clot in your lungs.  If you continue to have chest pain please follow-up with your primary care provider.
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
## 2022-05-31 NOTE — ED Notes (Signed)
Pt dressed out by this tech and tech [employer]  Pt belongings:  1 black pants 1 black/tan sweater  2 black shoes 1 brown purse
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
## 2022-05-31 NOTE — ED Notes (Signed)
Pt given meal tray and water, pt asked for diet gingerale

## 2022-05-31 NOTE — ED Notes (Signed)
Pt given sandwich tray and drink per her request. 

## 2022-05-31 NOTE — ED Notes (Signed)
At discharge Pt states that "I don't want to be in this world any longer and I will drink a bottle of Clorox with my meds." MD notified.

## 2022-05-31 NOTE — ED Notes (Signed)
VOL/Consult Completed/Re.Inpt. Psych Admit

## 2022-05-31 NOTE — BH Assessment (Signed)
Referral information for Psychiatric Hospitalization faxed to;   [facility] ([phone]),  [facility] ([phone]),   [facility] ([phone]- [phone]),   [facility] ([phone])  [facility] ([phone])  [facility] ([phone]) 

## 2022-05-31 NOTE — ED Notes (Signed)
Pt given meal tray, assisted with drink. Pt ate 50% of meal, states she is not hungry at this time

## 2022-05-31 NOTE — ED Notes (Signed)
Pt given drink and given assist to drink.

## 2022-05-31 NOTE — ED Triage Notes (Signed)
Pt brought in by EMS, woke up at 0630 with chest pain in the left side. Pt denies prior cardiac history, 8/10 sharp pain to the touch. A&O x4.

## 2022-06-01 DIAGNOSIS — R0789 Other chest pain: Secondary | ICD-10-CM | POA: Diagnosis not present

## 2022-06-01 DIAGNOSIS — F4323 Adjustment disorder with mixed anxiety and depressed mood: Secondary | ICD-10-CM | POA: Diagnosis not present

## 2022-06-01 LAB — POC URINE PREG, ED: Preg Test, Ur: NEGATIVE

## 2022-06-01 MED ORDER — ACETAMINOPHEN 325 MG PO TABS
650.0000 mg | ORAL_TABLET | ORAL | Status: DC | PRN
  Administered 2022-06-01: 650 mg via ORAL
  Filled 2022-06-01: qty 2

## 2022-06-01 MED ORDER — FLUTICASONE PROPIONATE 50 MCG/ACT NA SUSP: 1.0000 | Freq: Every day | NASAL | Status: DC | PRN

## 2022-06-01 MED ORDER — BACLOFEN 10 MG PO TABS
10.0000 mg | ORAL_TABLET | Freq: Three times a day (TID) | ORAL | Status: DC
  Administered 2022-06-01 (×2): 10 mg via ORAL
  Filled 2022-06-01 (×2): qty 1

## 2022-06-01 MED ORDER — BACLOFEN 10 MG PO TABS: 10.0000 mg | ORAL_TABLET | Freq: Three times a day (TID) | ORAL | Status: DC

## 2022-06-01 MED ORDER — GABAPENTIN 300 MG PO CAPS
400.0000 mg | ORAL_CAPSULE | Freq: Three times a day (TID) | ORAL | Status: DC
  Administered 2022-06-01: 400 mg via ORAL
  Filled 2022-06-01: qty 1

## 2022-06-01 NOTE — ED Notes (Signed)
Pt . Requested to be changed had a medium BM repositioned in bed.

## 2022-06-01 NOTE — ED Notes (Signed)
Pt's pastors wife coming to get patient within the next hour

## 2022-06-01 NOTE — ED Provider Notes (Signed)
-----------------------------------------   4:42 PM on 06/01/2022 -----------------------------------------  Patient was evaluated by psychiatry and cleared for discharge.  She had already been medically cleared.  She has a safe discharge plan.  Return precautions provided.   Arta Silence, MD 06/01/22 [phone]
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
## 2022-06-01 NOTE — ED Provider Notes (Signed)
Emergency Medicine Observation Re-evaluation Note  Victoria Gross is a 51 y.o. female, seen on rounds today.  Pt initially presented to the ED for complaints of Chest Pain Currently, the patient is resting comfortably.  Physical Exam  BP (!) 118/93 (BP Location: Right Arm)   Pulse 85   Temp 98.7 F (37.1 C) (Oral)   Resp 20   SpO2 100%  Physical Exam General: No acute distress Cardiac: Well-perfused extremities Lungs: No respiratory distress Psych: Appropriate mood and affect  ED Course / MDM  EKG:   I have reviewed the labs performed to date as well as medications administered while in observation.  Recent changes in the last 24 hours include none.  Plan  Current plan is for placement.    Naaman Plummer, MD 06/01/22 [phone]

## 2022-06-01 NOTE — ED Notes (Signed)
E-signature not working at this time. Pt verbalized understanding of D/C instructions, prescriptions and follow up care with no further questions at this time. Pt in NAD and wheeled to lobby at time of D/C. Belongings bag 2/2 returned to patient at time of departure. Friend here to take patient home. Pt assisted into car by this RN and Myra, NT.

## 2022-06-01 NOTE — ED Notes (Signed)
VOL/Rec. Inpt Admit

## 2022-06-01 NOTE — ED Notes (Signed)
Pt given dinner tray and drink at this time. 

## 2022-06-09 ENCOUNTER — Other Ambulatory Visit: Payer: Medicare Other

## 2022-06-11 ENCOUNTER — Ambulatory Visit: Payer: Medicare Other
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
## 2022-06-23 ENCOUNTER — Ambulatory Visit: Payer: Medicare Other | Admitting: Physical Therapy
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
## 2022-06-25 ENCOUNTER — Encounter: Payer: Medicare Other | Admitting: Physical Therapy

## 2022-06-30 ENCOUNTER — Ambulatory Visit: Payer: Medicare Other | Attending: Family Medicine | Admitting: Physical Therapy
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
## 2022-07-01 ENCOUNTER — Other Ambulatory Visit: Payer: Self-pay

## 2022-07-01 ENCOUNTER — Telehealth: Payer: Self-pay

## 2022-07-01 DIAGNOSIS — Z1211 Encounter for screening for malignant neoplasm of colon: Secondary | ICD-10-CM

## 2022-07-01 MED ORDER — NA SULFATE-K SULFATE-MG SULF 17.5-3.13-1.6 GM/177ML PO SOLN: 1.0000 | Freq: Once | ORAL | 0 refills | Status: AC

## 2022-07-01 NOTE — Telephone Encounter (Signed)
Gastroenterology Pre-Procedure Review  Request Date: 07/23/22 Requesting Physician: Dr. Tobi Bastos  PATIENT REVIEW QUESTIONS: The patient responded to the following health history questions as indicated:    1. Are you having any GI issues? no 2. Do you have a personal history of Polyps? no 3. Do you have a family history of Colon Cancer or Polyps? no 4. Diabetes Mellitus? no 5. Joint replacements in the past 12 months?no 6. Major health problems in the past 3 months?no 7. Any artificial heart valves, MVP, or defibrillator?no    MEDICATIONS & ALLERGIES:    Patient reports the following regarding taking any anticoagulation/antiplatelet therapy:   Plavix, Coumadin, Eliquis, Xarelto, Lovenox, Pradaxa, Brilinta, or Effient? no Aspirin? no  Patient confirms/reports the following medications:  Current Outpatient Medications  Medication Sig Dispense Refill   albuterol (VENTOLIN HFA) 108 (90 Base) MCG/ACT inhaler Inhale into the lungs every 6 (six) hours as needed for wheezing or shortness of breath.     baclofen (LIORESAL) 10 MG tablet Take 1 tablet (10 mg total) by mouth 3 (three) times daily. 90 each 0   fluticasone (FLONASE) 50 MCG/ACT nasal spray Place 1 spray into both nostrils daily as needed.     folic acid (FOLVITE) 1 MG tablet Take 1 tablet (1 mg total) by mouth daily. 30 tablet 0   gabapentin (NEURONTIN) 400 MG capsule Take 400 mg by mouth 3 (three) times daily. 1 tablet in morning.1 tablet afternoon,2 tablet at night     HYDROcodone-acetaminophen (NORCO/VICODIN) 5-325 MG tablet Take 1 tablet by mouth every 6 (six) hours as needed for severe pain or moderate pain. 12 tablet 0   lidocaine (LIDODERM) 5 % Place 1 patch onto the skin daily. Remove & Discard patch within 12 hours or as directed by MD 30 patch 0   medroxyPROGESTERone (DEPO-PROVERA) 150 MG/ML injection Inject 150 mg into the muscle every 3 (three) months.     Multiple Vitamin (MULTIVITAMIN WITH MINERALS) TABS tablet Take 1 tablet  by mouth daily. (Patient not taking: Reported on 05/31/2022)     polyethylene glycol (MIRALAX / GLYCOLAX) 17 g packet Take 17 g by mouth daily. 30 each 0   QUEtiapine (SEROQUEL) 50 MG tablet Take 50 mg by mouth at bedtime.     senna-docusate (SENOKOT-S) 8.6-50 MG tablet Take 1 tablet by mouth 2 (two) times daily. (Patient not taking: Reported on 05/31/2022) 60 tablet 0   SUMAtriptan (IMITREX) 50 MG tablet Take 1 tablet (50 mg total) by mouth daily as needed for migraine. 10 tablet 0   thiamine 100 MG tablet Take 1 tablet (100 mg total) by mouth daily. 30 tablet 0   No current facility-administered medications for this visit.    Patient confirms/reports the following allergies:  Allergies  Allergen Reactions   Aspirin Other (See Comments)   Prozac [Fluoxetine Hcl]    Tomato Other (See Comments)    No orders of the defined types were placed in this encounter.   AUTHORIZATION INFORMATION Primary Insurance: 1D#: Group #:  Secondary Insurance: 1D#: Group #:  SCHEDULE INFORMATION: Date: 07/23/22 Time: Location: [facility]

## 2022-07-02 ENCOUNTER — Ambulatory Visit: Payer: Medicare Other | Admitting: Physical Therapy
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
## 2022-07-07 ENCOUNTER — Encounter: Payer: Medicare Other | Admitting: Physical Therapy

## 2022-07-09 ENCOUNTER — Encounter: Payer: Medicare Other | Admitting: Physical Therapy

## 2022-07-12 ENCOUNTER — Emergency Department: Payer: Medicare Other

## 2022-07-12 ENCOUNTER — Other Ambulatory Visit: Payer: Self-pay

## 2022-07-12 ENCOUNTER — Emergency Department
Admission: EM | Admit: 2022-07-12 | Discharge: 2022-07-12 | Disposition: A | Discharge status: Home / Self Care | Payer: Medicare Other | Attending: Emergency Medicine | Admitting: Emergency Medicine

## 2022-07-12 DIAGNOSIS — R4182 Altered mental status, unspecified: Secondary | ICD-10-CM | POA: Diagnosis present

## 2022-07-12 LAB — COMPREHENSIVE METABOLIC PANEL
ALT: 17 U/L (ref 0–44)
AST: 19 U/L (ref 15–41)
Albumin: 3.9 g/dL (ref 3.5–5.0)
Alkaline Phosphatase: 94 U/L (ref 38–126)
Anion gap: 11 (ref 5–15)
BUN: 16 mg/dL (ref 6–20)
CO2: 27 mmol/L (ref 22–32)
Calcium: 9.4 mg/dL (ref 8.9–10.3)
Chloride: 105 mmol/L (ref 98–111)
Creatinine, Ser: 0.99 mg/dL (ref 0.44–1.00)
GFR, Estimated: >60 mL/min (ref >60)
Glucose, Bld: 87 mg/dL (ref 70–99)
Potassium: 3.9 mmol/L (ref 3.5–5.1)
Sodium: 143 mmol/L (ref 135–145)
Total Bilirubin: 0.9 mg/dL (ref 0.3–1.2)
Total Protein: 8.9 g/dL — ABNORMAL HIGH (ref 6.5–8.1)

## 2022-07-12 LAB — CBC
HCT: 41.6 % (ref 36.0–46.0)
Hemoglobin: 12.5 g/dL (ref 12.0–15.0)
MCH: 23 pg — ABNORMAL LOW (ref 26.0–34.0)
MCHC: 30 g/dL (ref 30.0–36.0)
MCV: 76.5 fL — ABNORMAL LOW (ref 80.0–100.0)
Platelets: 352 10*3/uL (ref 150–400)
RBC: 5.44 MIL/uL — ABNORMAL HIGH (ref 3.87–5.11)
RDW: 17 % — ABNORMAL HIGH (ref 11.5–15.5)
WBC: 9.5 10*3/uL (ref 4.0–10.5)
nRBC: 0 % (ref 0.0–0.2)

## 2022-07-12 LAB — URINALYSIS, ROUTINE W REFLEX MICROSCOPIC
Bacteria, UA: NONE SEEN
Bilirubin Urine: NEGATIVE
Glucose, UA: NEGATIVE mg/dL
Hgb urine dipstick: NEGATIVE
Ketones, ur: NEGATIVE mg/dL
Leukocytes,Ua: NEGATIVE
Nitrite: NEGATIVE
Protein, ur: 30 mg/dL — AB
Specific Gravity, Urine: 1.019 (ref 1.005–1.030)
pH: 6 (ref 5.0–8.0)

## 2022-07-12 LAB — POC URINE PREG, ED: Preg Test, Ur: NEGATIVE

## 2022-07-12 NOTE — Progress Notes (Addendum)
[place] police Dept confirmed patient lives with Malena Edman. [place] PD stated she appeared to be appropriate when she answered the door. Haywood Lasso had no phone number to provide.

## 2022-07-12 NOTE — ED Triage Notes (Addendum)
EMS states pt is coming from home, due to AMS. Urine noted to have a fowl oder. Pt is wheelchair bound.   Pt states we are in the month of may.   EMS vitals.  153/80 BP 94HR 100% RA CBG 140 Temp 98.37f

## 2022-07-12 NOTE — ED Notes (Signed)
Pt to ct 

## 2022-07-12 NOTE — ED Provider Notes (Signed)
[facility] Provider Note   Event Date/Time   First MD Initiated Contact with Patient 07/12/22 1209     (approximate) History  Altered Mental Status  HPI Victoria Gross is a 51 y.o. female with a stated past medical history of recurrent migraines, polysubstance abuse, recurrent urinary tract infections, lymphedema, anxiety/depression, and blindness of the left eye who presents via EMS complaining of altered mental status.  Patient is wheelchair-bound at baseline and states that she has "been out of it" over the last 24 hours.  Patient does endorse generalized weakness over this time and stating that she does not know the time or place occasionally.  Patient states that she has had symptoms similar to this in the past when she has had a migraine however she denies having any significant head pain at this time.  Patient denies any other complaints ROS: Patient currently denies any vision changes, tinnitus, difficulty speaking, facial droop, sore throat, chest pain, shortness of breath, abdominal pain, nausea/vomiting/diarrhea, dysuria, or weakness/numbness/paresthesias in any extremity   Physical Exam  Triage Vital Signs: ED Triage Vitals  Enc Vitals Group     BP 07/12/22 1037 (!) 123/94     Pulse Rate 07/12/22 1037 84     Resp 07/12/22 1037 20     Temp 07/12/22 1037 98.5 F (36.9 C)     Temp Source 07/12/22 1037 Oral     SpO2 07/12/22 1037 98 %     Weight --      Height --      Head Circumference --      Peak Flow --      Pain Score 07/12/22 1033 0     Pain Loc --      Pain Edu? --      Excl. in GC? --    Most recent vital signs: Vitals:   07/12/22 1630 07/12/22 1709  BP:  (!) 140/90  Pulse:  86  Resp: 20 20  Temp:  98.1 F (36.7 C)  SpO2:  98%   General: Awake, oriented to self and situation. CV:  Good peripheral perfusion.  Resp:  Normal effort.  Abd:  No distention.  Other:  Middle-aged obese African-American female laying in bed in no acute  distress.  Patient uncooperative during exam however neurologic exam was unremarkable for any acute weakness, decreased sensation, slurred speech, or vision changes ED Results / Procedures / Treatments  Labs (all labs ordered are listed, but only abnormal results are displayed) Labs Reviewed  COMPREHENSIVE METABOLIC PANEL - Abnormal; Notable for the following components:      Result Value   Total Protein 8.9 (*)    All other components within normal limits  CBC - Abnormal; Notable for the following components:   RBC 5.44 (*)    MCV 76.5 (*)    MCH 23.0 (*)    RDW 17.0 (*)    All other components within normal limits  URINALYSIS, ROUTINE W REFLEX MICROSCOPIC - Abnormal; Notable for the following components:   Color, Urine YELLOW (*)    APPearance HAZY (*)    Protein, ur 30 (*)    All other components within normal limits  POC URINE PREG, ED   EKG ED ECG REPORT I, Merwyn Katos, the attending physician, personally viewed and interpreted this ECG. Date: 07/12/2022 EKG Time: 1043 Rate: 89 Rhythm: normal sinus rhythm QRS Axis: normal Intervals: normal ST/T Wave abnormalities: normal Narrative Interpretation: no evidence of acute ischemia RADIOLOGY ED MD  interpretation: CT of the head without contrast interpreted by me shows no evidence of acute abnormalities including no intracerebral hemorrhage, obvious masses, or significant edema -Agree with radiology assessment Official radiology report(s): No results found. PROCEDURES: Critical Care performed: No .1-3 Lead EKG Interpretation  Performed by: Merwyn Katos, MD Authorized by: Merwyn Katos, MD     Interpretation: normal     ECG rate:  86   ECG rate assessment: normal     Rhythm: sinus rhythm     Ectopy: none     Conduction: normal    MEDICATIONS ORDERED IN ED: Medications - No data to display IMPRESSION / MDM / ASSESSMENT AND PLAN / ED COURSE  I reviewed the triage vital signs and the nursing notes.                              The patient is on the cardiac monitor to evaluate for evidence of arrhythmia and/or significant heart rate changes. Patient's presentation is most consistent with acute presentation with potential threat to life or bodily function. The patient suffered an episode of altered mental status, but there is no overt concern for a dangerous emergent cause such as, but not limited to, CNS infection, severe Toxidrome, severe metabolic derangement, or stroke.  Given History, Physical, and Workup the cause appears to be general deconditioning and/or patient is at her functional baseline  Disposition: Discharge. At the time of discharge, the patient is back to baseline mental status.   FINAL CLINICAL IMPRESSION(S) / ED DIAGNOSES   Final diagnoses:  Altered mental status, unspecified altered mental status type   Rx / DC Orders   ED Discharge Orders     None      Note:  This document was prepared using Dragon voice recognition software and may include unintentional dictation errors.   Merwyn Katos, MD 07/13/22 [phone]

## 2022-07-12 NOTE — ED Notes (Signed)
Pt remains alert and oriented x 2. Patient reports she has a care giver at home (doesn't know her name) but that this caregiver doesn't have a phone or a car. Patient reports the caregiver was not home when EMS picked her up.  Patient does not have her home keys.   Spoke with [name] (pts friend) and she says that this patient's recent caregiver Inetta Fermo) was evicted and no longer lives with the patient.  From my understanding patient does not have anyone at home at this time to help care for her. Notified dr. Vicente Males about concerns with d/c patient home - see new order for SW

## 2022-07-12 NOTE — ED Notes (Signed)
ATTEMPTED TO CALL ALL PATIENT CONTACTS FOR RIDE HOME AND CONFIRMATION THAT SOMEONE WILL BE HOME TO LET PATIENT INTO HOUSE - NO ANSWER FROM Victoria Gross - Victoria Gross HAS NO CAR AVAILABLE.

## 2022-07-12 NOTE — ED Notes (Addendum)
Pt leaves with ambulance. Caregiver to receive patient at home. VSS upon leaving ER. Pt provided with clean brief prior to dispo.

## 2022-07-12 NOTE — Progress Notes (Signed)
CSW notified patients nurse that roommate is home. CSW suggested patient return home by ambulance due to no ride home. Notes indicate patient is wheelchair bound.
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
## 2022-07-12 NOTE — Progress Notes (Signed)
CSW contacted [facility] non-emergency to request welfare check to patients home. EMS sheet mentions a roommate at the home. CSW is trying to verify if someone lives with patient. Patient is not oriented to give CSW information about who she lives with or any phone numbers.

## 2022-07-14 ENCOUNTER — Encounter: Payer: Medicare Other | Admitting: Physical Therapy

## 2022-07-16 ENCOUNTER — Encounter: Payer: Medicare Other | Admitting: Physical Therapy
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
## 2022-07-22 ENCOUNTER — Telehealth: Payer: Self-pay | Admitting: Gastroenterology

## 2022-07-22 NOTE — Telephone Encounter (Signed)
Pt called and cancelled procedure for 07/23/2022 and needs a call back to resched

## 2022-07-23 ENCOUNTER — Encounter: Payer: Self-pay | Admitting: Anesthesiology

## 2022-07-23 ENCOUNTER — Encounter: Admission: RE | Payer: Self-pay | Source: Home / Self Care

## 2022-07-23 ENCOUNTER — Encounter: Payer: Medicare Other | Admitting: Physical Therapy

## 2022-07-23 ENCOUNTER — Ambulatory Visit: Admission: RE | Admit: 2022-07-23 | Payer: Medicare Other | Source: Home / Self Care | Admitting: Gastroenterology

## 2022-07-23 SURGERY — COLONOSCOPY WITH PROPOFOL
Anesthesia: General

## 2022-07-23 NOTE — Telephone Encounter (Signed)
Returned patients phone call to reschedule her colonoscopy with Dr. Tobi Bastos.  LVM for pt to return my call.   Thanks,  [place], [place]

## 2022-08-27 DIAGNOSIS — Z0189 Encounter for other specified special examinations: Secondary | ICD-10-CM

## 2022-08-27 LAB — GLUCOSE, POCT (MANUAL RESULT ENTRY): POC Glucose: 107 mg/dl — AB (ref 70–99)
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
## 2022-10-19 ENCOUNTER — Emergency Department (HOSPITAL_COMMUNITY)
Admission: EM | Admit: 2022-10-19 | Discharge: 2022-10-19 | Disposition: A | Discharge status: Home / Self Care | Payer: 59 | Attending: Emergency Medicine | Admitting: Emergency Medicine

## 2022-10-19 ENCOUNTER — Emergency Department (HOSPITAL_COMMUNITY): Payer: 59

## 2022-10-19 ENCOUNTER — Encounter (HOSPITAL_COMMUNITY): Payer: Self-pay

## 2022-10-19 DIAGNOSIS — Z993 Dependence on wheelchair: Secondary | ICD-10-CM | POA: Insufficient documentation

## 2022-10-19 DIAGNOSIS — Y92410 Unspecified street and highway as the place of occurrence of the external cause: Secondary | ICD-10-CM | POA: Diagnosis not present

## 2022-10-19 DIAGNOSIS — G9389 Other specified disorders of brain: Secondary | ICD-10-CM | POA: Insufficient documentation

## 2022-10-19 DIAGNOSIS — M6281 Muscle weakness (generalized): Secondary | ICD-10-CM | POA: Diagnosis not present

## 2022-10-19 DIAGNOSIS — I700 Atherosclerosis of aorta: Secondary | ICD-10-CM | POA: Insufficient documentation

## 2022-10-19 DIAGNOSIS — M47812 Spondylosis without myelopathy or radiculopathy, cervical region: Secondary | ICD-10-CM | POA: Diagnosis not present

## 2022-10-19 DIAGNOSIS — R079 Chest pain, unspecified: Secondary | ICD-10-CM | POA: Diagnosis not present

## 2022-10-19 DIAGNOSIS — R410 Disorientation, unspecified: Secondary | ICD-10-CM | POA: Diagnosis not present

## 2022-10-19 DIAGNOSIS — R2689 Other abnormalities of gait and mobility: Secondary | ICD-10-CM | POA: Insufficient documentation

## 2022-10-19 LAB — COMPREHENSIVE METABOLIC PANEL
ALT: 16 U/L (ref 0–44)
AST: 22 U/L (ref 15–41)
Albumin: 3.6 g/dL (ref 3.5–5.0)
Alkaline Phosphatase: 94 U/L (ref 38–126)
Anion gap: 13 (ref 5–15)
BUN: 10 mg/dL (ref 6–20)
CO2: 25 mmol/L (ref 22–32)
Calcium: 9 mg/dL (ref 8.9–10.3)
Chloride: 99 mmol/L (ref 98–111)
Creatinine, Ser: 0.83 mg/dL (ref 0.44–1.00)
GFR, Estimated: >60 mL/min (ref >60)
Glucose, Bld: 102 mg/dL — ABNORMAL HIGH (ref 70–99)
Potassium: 3.4 mmol/L — ABNORMAL LOW (ref 3.5–5.1)
Sodium: 137 mmol/L (ref 135–145)
Total Bilirubin: 0.7 mg/dL (ref 0.3–1.2)
Total Protein: 7.9 g/dL (ref 6.5–8.1)

## 2022-10-19 LAB — CBC
HCT: 40.5 % (ref 36.0–46.0)
Hemoglobin: 12.7 g/dL (ref 12.0–15.0)
MCH: 23.7 pg — ABNORMAL LOW (ref 26.0–34.0)
MCHC: 31.4 g/dL (ref 30.0–36.0)
MCV: 75.7 fL — ABNORMAL LOW (ref 80.0–100.0)
Platelets: 274 10*3/uL (ref 150–400)
RBC: 5.35 MIL/uL — ABNORMAL HIGH (ref 3.87–5.11)
RDW: 17.5 % — ABNORMAL HIGH (ref 11.5–15.5)
WBC: 8 10*3/uL (ref 4.0–10.5)
nRBC: 0 % (ref 0.0–0.2)

## 2022-10-19 LAB — I-STAT CHEM 8, ED
BUN: 11 mg/dL (ref 6–20)
Calcium, Ion: 1.08 mmol/L — ABNORMAL LOW (ref 1.15–1.40)
Chloride: 102 mmol/L (ref 98–111)
Creatinine, Ser: 0.8 mg/dL (ref 0.44–1.00)
Glucose, Bld: 105 mg/dL — ABNORMAL HIGH (ref 70–99)
HCT: 42 % (ref 36.0–46.0)
Hemoglobin: 14.3 g/dL (ref 12.0–15.0)
Potassium: 3.5 mmol/L (ref 3.5–5.1)
Sodium: 138 mmol/L (ref 135–145)
TCO2: 25 mmol/L (ref 22–32)

## 2022-10-19 LAB — SAMPLE TO BLOOD BANK

## 2022-10-19 LAB — PROTIME-INR
INR: 1.1 (ref 0.8–1.2)
Prothrombin Time: 13.7 seconds (ref 11.4–15.2)

## 2022-10-19 LAB — ETHANOL: Alcohol, Ethyl (B): <10 mg/dL (ref <10)

## 2022-10-19 LAB — LACTIC ACID, PLASMA: Lactic Acid, Venous: 1.8 mmol/L (ref 0.5–1.9)

## 2022-10-19 MED ORDER — IOPAMIDOL (ISOVUE-370) INJECTION 76%
75.0000 mL | Freq: Once | INTRAVENOUS | Status: AC | PRN
  Administered 2022-10-19: 75 mL via INTRAVENOUS

## 2022-10-19 NOTE — ED Notes (Signed)
Pt given dinner tray and clean brief applied
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
## 2022-10-19 NOTE — Progress Notes (Signed)
Pt in immediate care of medical team. No family present. Chaplain remains available for support when pt or family need presents.   Please page as further needs arise.  Donald Prose. Elyn Peers, M.Div. [facility] Chaplain Pager [phone] Office [phone]

## 2022-10-19 NOTE — ED Notes (Signed)
PTAR called for transport back to address on file. Per PTAR 4th on the list
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
## 2022-10-19 NOTE — ED Notes (Signed)
Attempted calling daughter to give an update. No one answered.
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
## 2022-10-19 NOTE — ED Provider Notes (Signed)
  Physical Exam  BP (!) 141/93   Pulse 86   Temp 97.9 F (36.6 C) (Oral)   Resp 13   SpO2 100%   Physical Exam  Procedures  Procedures  ED Course / MDM    Medical Decision Making Amount and/or Complexity of Data Reviewed Labs: ordered. Radiology: ordered.  Risk Prescription drug management.   Received in signout.  Pending transition of care.  Have gotten wheelchair.  Can discharge       Davonna Belling, MD 10/19/22 2008

## 2022-10-19 NOTE — ED Notes (Signed)
PT pending PTAR for home transport and dc papers
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
## 2022-10-19 NOTE — Care Management (Signed)
ED RNCM spoke with patient's peer support person Carola Rhine  [phone] after patient provided  permission to speak with her concerning her care.  Ms. Owens Shark provided me with the name of the [facility] in [place] who provided the patient's powered w/c.  CM attempted to  contact them no answer, closed for the day.  Ms. Owens Shark states she will attempt to contact them tomorrow to assist with getting her w/c repaired.  Patient provided me with contact information for [facility] who would be able to assist with getting patient a manual wheelchair temporarily.  Patient states that she has assistance at home who will be able to assist with pushing the w/c for mobility.  ED RNCM spoke with Rosann Auerbach who confirmed that w/c was delivered to patient's home. Updated Chloe RN and Dr. Alvino Chapel patient is up for discharge. [facility] called.  No further ED CM needs identified.
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
## 2022-10-31 ENCOUNTER — Other Ambulatory Visit: Payer: Self-pay

## 2022-10-31 ENCOUNTER — Emergency Department: Payer: 59

## 2022-10-31 ENCOUNTER — Emergency Department
Admission: EM | Admit: 2022-10-31 | Discharge: 2022-10-31 | Disposition: A | Discharge status: Home / Self Care | Payer: 59 | Attending: Emergency Medicine | Admitting: Emergency Medicine

## 2022-10-31 DIAGNOSIS — G44319 Acute post-traumatic headache, not intractable: Secondary | ICD-10-CM | POA: Diagnosis not present

## 2022-10-31 DIAGNOSIS — M79602 Pain in left arm: Secondary | ICD-10-CM | POA: Diagnosis not present

## 2022-10-31 DIAGNOSIS — M7989 Other specified soft tissue disorders: Secondary | ICD-10-CM | POA: Insufficient documentation

## 2022-10-31 DIAGNOSIS — R109 Unspecified abdominal pain: Secondary | ICD-10-CM | POA: Diagnosis not present

## 2022-10-31 DIAGNOSIS — M62838 Other muscle spasm: Secondary | ICD-10-CM | POA: Diagnosis not present

## 2022-10-31 LAB — COMPREHENSIVE METABOLIC PANEL
ALT: 18 U/L (ref 0–44)
AST: 18 U/L (ref 15–41)
Albumin: 4.2 g/dL (ref 3.5–5.0)
Alkaline Phosphatase: 98 U/L (ref 38–126)
Anion gap: 11 (ref 5–15)
BUN: 17 mg/dL (ref 6–20)
CO2: 27 mmol/L (ref 22–32)
Calcium: 9.5 mg/dL (ref 8.9–10.3)
Chloride: 104 mmol/L (ref 98–111)
Creatinine, Ser: 0.78 mg/dL (ref 0.44–1.00)
GFR, Estimated: >60 mL/min (ref >60)
Glucose, Bld: 103 mg/dL — ABNORMAL HIGH (ref 70–99)
Potassium: 3.5 mmol/L (ref 3.5–5.1)
Sodium: 142 mmol/L (ref 135–145)
Total Bilirubin: 0.6 mg/dL (ref 0.3–1.2)
Total Protein: 9 g/dL — ABNORMAL HIGH (ref 6.5–8.1)

## 2022-10-31 LAB — CBC WITH DIFFERENTIAL/PLATELET
Abs Immature Granulocytes: 0.03 10*3/uL (ref 0.00–0.07)
Basophils Absolute: 0 10*3/uL (ref 0.0–0.1)
Basophils Relative: 0 %
Eosinophils Absolute: 0.1 10*3/uL (ref 0.0–0.5)
Eosinophils Relative: 2 %
HCT: 41.7 % (ref 36.0–46.0)
Hemoglobin: 12.6 g/dL (ref 12.0–15.0)
Immature Granulocytes: 0 %
Lymphocytes Relative: 21 %
Lymphs Abs: 1.6 10*3/uL (ref 0.7–4.0)
MCH: 23.2 pg — ABNORMAL LOW (ref 26.0–34.0)
MCHC: 30.2 g/dL (ref 30.0–36.0)
MCV: 76.7 fL — ABNORMAL LOW (ref 80.0–100.0)
Monocytes Absolute: 0.5 10*3/uL (ref 0.1–1.0)
Monocytes Relative: 7 %
Neutro Abs: 5.4 10*3/uL (ref 1.7–7.7)
Neutrophils Relative %: 70 %
Platelets: 328 10*3/uL (ref 150–400)
RBC: 5.44 MIL/uL — ABNORMAL HIGH (ref 3.87–5.11)
RDW: 17.2 % — ABNORMAL HIGH (ref 11.5–15.5)
WBC: 7.8 10*3/uL (ref 4.0–10.5)
nRBC: 0 % (ref 0.0–0.2)

## 2022-10-31 LAB — HCG, QUANTITATIVE, PREGNANCY: hCG, Beta Chain, Quant, S: 1 m[IU]/mL (ref <5)

## 2022-10-31 LAB — TROPONIN I (HIGH SENSITIVITY): Troponin I (High Sensitivity): 4 ng/L (ref <18)

## 2022-10-31 MED ORDER — ACETAMINOPHEN 325 MG PO TABS
650.0000 mg | ORAL_TABLET | Freq: Once | ORAL | Status: DC
  Filled 2022-10-31: qty 2

## 2022-10-31 MED ORDER — BUTALBITAL-APAP-CAFFEINE 50-325-40 MG PO TABS: 1.0000 | ORAL_TABLET | Freq: Four times a day (QID) | ORAL | 0 refills | Status: AC | PRN

## 2022-10-31 MED ORDER — BUTALBITAL-APAP-CAFFEINE 50-325-40 MG PO TABS
1.0000 | ORAL_TABLET | Freq: Once | ORAL | Status: AC
  Administered 2022-10-31: 1 via ORAL
  Filled 2022-10-31: qty 1
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
## 2023-01-03 ENCOUNTER — Other Ambulatory Visit: Payer: Self-pay

## 2023-01-03 ENCOUNTER — Emergency Department: Payer: 59

## 2023-01-03 ENCOUNTER — Emergency Department
Admission: EM | Admit: 2023-01-03 | Discharge: 2023-01-03 | Disposition: A | Discharge status: Home / Self Care | Payer: 59 | Attending: Emergency Medicine | Admitting: Emergency Medicine

## 2023-01-03 DIAGNOSIS — Z1152 Encounter for screening for COVID-19: Secondary | ICD-10-CM | POA: Diagnosis not present

## 2023-01-03 DIAGNOSIS — M79662 Pain in left lower leg: Secondary | ICD-10-CM | POA: Diagnosis not present

## 2023-01-03 DIAGNOSIS — M79605 Pain in left leg: Secondary | ICD-10-CM

## 2023-01-03 DIAGNOSIS — R531 Weakness: Secondary | ICD-10-CM | POA: Diagnosis present

## 2023-01-03 LAB — BASIC METABOLIC PANEL
Anion gap: 9 (ref 5–15)
BUN: 20 mg/dL (ref 6–20)
CO2: 26 mmol/L (ref 22–32)
Calcium: 8.8 mg/dL — ABNORMAL LOW (ref 8.9–10.3)
Chloride: 103 mmol/L (ref 98–111)
Creatinine, Ser: 1.05 mg/dL — ABNORMAL HIGH (ref 0.44–1.00)
GFR, Estimated: >60 mL/min (ref >60)
Glucose, Bld: 96 mg/dL (ref 70–99)
Potassium: 3.9 mmol/L (ref 3.5–5.1)
Sodium: 138 mmol/L (ref 135–145)

## 2023-01-03 LAB — URINALYSIS, ROUTINE W REFLEX MICROSCOPIC
Bilirubin Urine: NEGATIVE
Glucose, UA: NEGATIVE mg/dL
Hgb urine dipstick: NEGATIVE
Ketones, ur: NEGATIVE mg/dL
Leukocytes,Ua: NEGATIVE
Nitrite: NEGATIVE
Protein, ur: NEGATIVE mg/dL
Specific Gravity, Urine: 1.011 (ref 1.005–1.030)
pH: 7 (ref 5.0–8.0)

## 2023-01-03 LAB — CBC
HCT: 38.4 % (ref 36.0–46.0)
Hemoglobin: 11.7 g/dL — ABNORMAL LOW (ref 12.0–15.0)
MCH: 23.5 pg — ABNORMAL LOW (ref 26.0–34.0)
MCHC: 30.5 g/dL (ref 30.0–36.0)
MCV: 77.1 fL — ABNORMAL LOW (ref 80.0–100.0)
Platelets: 304 10*3/uL (ref 150–400)
RBC: 4.98 MIL/uL (ref 3.87–5.11)
RDW: 16 % — ABNORMAL HIGH (ref 11.5–15.5)
WBC: 8 10*3/uL (ref 4.0–10.5)
nRBC: 0 % (ref 0.0–0.2)

## 2023-01-03 LAB — POC URINE PREG, ED: Preg Test, Ur: NEGATIVE

## 2023-01-03 LAB — SARS CORONAVIRUS 2 BY RT PCR: SARS Coronavirus 2 by RT PCR: NEGATIVE

## 2023-01-03 MED ORDER — SODIUM CHLORIDE 0.9 % IV SOLN: Freq: Once | INTRAVENOUS | Status: AC

## 2023-01-03 MED ORDER — FENTANYL CITRATE PF 50 MCG/ML IJ SOSY
50.0000 ug | PREFILLED_SYRINGE | Freq: Once | INTRAMUSCULAR | Status: AC
  Administered 2023-01-03: 50 ug via INTRAVENOUS
  Filled 2023-01-03 (×2): qty 1

## 2023-01-03 MED ORDER — NAPROXEN 500 MG PO TABS: 500.0000 mg | ORAL_TABLET | Freq: Two times a day (BID) | ORAL | 2 refills | Status: AC

## 2023-01-03 NOTE — ED Provider Notes (Signed)
[facility] Provider Note    Event Date/Time   First MD Initiated Contact with Patient 01/03/23 1624     (approximate)   History   Left sided pain   HPI  Victoria Gross is a 52 y.o. female who presents to the emergency department today with primary concern for left sided pain.  She says that the pain has been getting worse over the past 2 to 3 days.  Located in both her left lower leg and left upper arm.  She has additionally noticed worsening swelling in her left lower leg.  She denies any recent trauma or falls.  Additionally she has had decreased oral intake and decreased appetite.  She has felt weak.  She denies any fevers.  Denies any known sick contacts.     Physical Exam   Triage Vital Signs: ED Triage Vitals  Enc Vitals Group     BP 01/03/23 1459 (!) 146/89     Pulse Rate 01/03/23 1459 88     Resp 01/03/23 1459 18     Temp 01/03/23 1459 98.3 F (36.8 C)     Temp Source 01/03/23 1459 Oral     SpO2 01/03/23 1459 100 %     Weight 01/03/23 1458 198 lb 6.6 oz (90 kg)     Height --      Head Circumference --      Peak Flow --      Pain Score 01/03/23 1458 0     Pain Loc --      Pain Edu? --      Excl. in GC? --     Most recent vital signs: Vitals:   01/03/23 1459  BP: (!) 146/89  Pulse: 88  Resp: 18  Temp: 98.3 F (36.8 C)  SpO2: 100%   General: Awake, alert, oriented. CV:  Good peripheral perfusion. Regular rate and rhythm. Resp:  Normal effort. Lungs clear. Abd:  No distention.    ED Results / Procedures / Treatments   Labs (all labs ordered are listed, but only abnormal results are displayed) Labs Reviewed  BASIC METABOLIC PANEL - Abnormal; Notable for the following components:      Result Value   Creatinine, Ser 1.05 (*)    Calcium 8.8 (*)    All other components within normal limits  CBC - Abnormal; Notable for the following components:   Hemoglobin 11.7 (*)    MCV 77.1 (*)    MCH 23.5 (*)    RDW 16.0 (*)    All  other components within normal limits  URINALYSIS, ROUTINE W REFLEX MICROSCOPIC - Abnormal; Notable for the following components:   Color, Urine STRAW (*)    APPearance CLEAR (*)    All other components within normal limits  SARS CORONAVIRUS 2 BY RT PCR  CBG MONITORING, ED  POC URINE PREG, ED     EKG  I, Phineas Semen, attending physician, personally viewed and interpreted this EKG  EKG Time: 1510 Rate: 91 Rhythm: normal sinus rhythm Axis: normal Intervals: qtc 482 QRS: narrow, q waves v1 ST changes: no st elevation Impression: abnormal ekg   RADIOLOGY I independently interpreted and visualized the left lower leg [place]. My interpretation: No large clot Radiology interpretation:  IMPRESSION:  Limited evaluation of the LEFT peroneal vein, without evidence of  DVT within the LEFT lower extremity.    PROCEDURES:  Critical Care performed: No   MEDICATIONS ORDERED IN ED: Medications - No data to display  IMPRESSION / MDM / ASSESSMENT AND PLAN / ED COURSE  I reviewed the triage vital signs and the nursing notes.                              Differential diagnosis includes, but is not limited to, DVT, chronic pain exacerbation, viral infection  Patient's presentation is most consistent with acute presentation with potential threat to life or bodily function.   The patient is on the cardiac monitor to evaluate for evidence of arrhythmia and/or significant heart rate changes.  Patient presented to the emergency department today because of concerns for left sided pain as well as some decreased oral intake and weakness.  On exam patient has a left upper arm and slight contracture.  Left lower leg is swollen.  Did obtain an ultrasound of the left lower leg which did not show any evidence of DVT.  Blood work without significant anemia or leukocytosis.  No concerning electrolyte abnormalities.  COVID was negative.  At this time I think more likely exacerbation of chronic pain.   Will plan on discharging with prescription for naproxen.      FINAL CLINICAL IMPRESSION(S) / ED DIAGNOSES   Final diagnoses:  Weakness  Left leg pain     Note:  This document was prepared using Dragon voice recognition software and may include unintentional dictation errors.    Phineas Semen, MD 01/03/23 2213

## 2023-01-03 NOTE — Discharge Instructions (Signed)
Please seek medical attention for any high fevers, chest pain, shortness of breath, change in behavior, persistent vomiting, bloody stool or any other new or concerning symptoms.  

## 2023-01-03 NOTE — ED Triage Notes (Signed)
Pt presents to the ED due via [facility] due to weakness. Pt states she is unable get up. Pt has pitting edema to the L leg. Pt arrived saturated in urine. Pt states a friend takes care of her.

## 2023-01-03 NOTE — ED Notes (Signed)
Pt cleaned up by this tech, Layla Maw EDT and [employer]. Purewick placed on pt due to weakness and is unable to move well. New chux pad placed as well. Brief applied. Pt given 3 blankets. Asking for "cream" for her stomach as it is "itching", MD notified. Pt also asking about pain meds for her legs, MD notified of this as well.

## 2023-01-03 NOTE — ED Notes (Signed)
Called [facility] for transport back home 

## 2023-01-12 ENCOUNTER — Encounter: Payer: Self-pay | Admitting: Emergency Medicine

## 2023-01-12 ENCOUNTER — Emergency Department
Admission: EM | Admit: 2023-01-12 | Discharge: 2023-01-12 | Disposition: A | Discharge status: Home / Self Care | Payer: 59 | Attending: Emergency Medicine | Admitting: Emergency Medicine

## 2023-01-12 ENCOUNTER — Emergency Department: Payer: 59

## 2023-01-12 ENCOUNTER — Other Ambulatory Visit: Payer: Self-pay

## 2023-01-12 DIAGNOSIS — R7401 Elevation of levels of liver transaminase levels: Secondary | ICD-10-CM | POA: Diagnosis not present

## 2023-01-12 DIAGNOSIS — M7989 Other specified soft tissue disorders: Secondary | ICD-10-CM | POA: Diagnosis not present

## 2023-01-12 DIAGNOSIS — L02213 Cutaneous abscess of chest wall: Secondary | ICD-10-CM | POA: Insufficient documentation

## 2023-01-12 DIAGNOSIS — I10 Essential (primary) hypertension: Secondary | ICD-10-CM | POA: Insufficient documentation

## 2023-01-12 DIAGNOSIS — M791 Myalgia, unspecified site: Secondary | ICD-10-CM | POA: Diagnosis not present

## 2023-01-12 LAB — COMPREHENSIVE METABOLIC PANEL
ALT: 14 U/L (ref 0–44)
AST: 17 U/L (ref 15–41)
Albumin: 3.6 g/dL (ref 3.5–5.0)
Alkaline Phosphatase: 98 U/L (ref 38–126)
Anion gap: 8 (ref 5–15)
BUN: 23 mg/dL — ABNORMAL HIGH (ref 6–20)
CO2: 27 mmol/L (ref 22–32)
Calcium: 8.8 mg/dL — ABNORMAL LOW (ref 8.9–10.3)
Chloride: 105 mmol/L (ref 98–111)
Creatinine, Ser: 0.81 mg/dL (ref 0.44–1.00)
GFR, Estimated: >60 mL/min (ref >60)
Glucose, Bld: 91 mg/dL (ref 70–99)
Potassium: 3.8 mmol/L (ref 3.5–5.1)
Sodium: 140 mmol/L (ref 135–145)
Total Bilirubin: 0.5 mg/dL (ref 0.3–1.2)
Total Protein: 7.9 g/dL (ref 6.5–8.1)

## 2023-01-12 LAB — CBC WITH DIFFERENTIAL/PLATELET
Abs Immature Granulocytes: 0.03 10*3/uL (ref 0.00–0.07)
Basophils Absolute: 0 10*3/uL (ref 0.0–0.1)
Basophils Relative: 0 %
Eosinophils Absolute: 0.3 10*3/uL (ref 0.0–0.5)
Eosinophils Relative: 3 %
HCT: 35.3 % — ABNORMAL LOW (ref 36.0–46.0)
Hemoglobin: 10.9 g/dL — ABNORMAL LOW (ref 12.0–15.0)
Immature Granulocytes: 0 %
Lymphocytes Relative: 25 %
Lymphs Abs: 2.4 10*3/uL (ref 0.7–4.0)
MCH: 23.2 pg — ABNORMAL LOW (ref 26.0–34.0)
MCHC: 30.9 g/dL (ref 30.0–36.0)
MCV: 75.1 fL — ABNORMAL LOW (ref 80.0–100.0)
Monocytes Absolute: 0.7 10*3/uL (ref 0.1–1.0)
Monocytes Relative: 7 %
Neutro Abs: 6.1 10*3/uL (ref 1.7–7.7)
Neutrophils Relative %: 65 %
Platelets: 401 10*3/uL — ABNORMAL HIGH (ref 150–400)
RBC: 4.7 MIL/uL (ref 3.87–5.11)
RDW: 15.8 % — ABNORMAL HIGH (ref 11.5–15.5)
WBC: 9.6 10*3/uL (ref 4.0–10.5)
nRBC: 0 % (ref 0.0–0.2)

## 2023-01-12 LAB — URINALYSIS, ROUTINE W REFLEX MICROSCOPIC
Bilirubin Urine: NEGATIVE
Glucose, UA: NEGATIVE mg/dL
Hgb urine dipstick: NEGATIVE
Ketones, ur: NEGATIVE mg/dL
Leukocytes,Ua: NEGATIVE
Nitrite: NEGATIVE
Protein, ur: NEGATIVE mg/dL
Specific Gravity, Urine: 1.013 (ref 1.005–1.030)
pH: 7 (ref 5.0–8.0)

## 2023-01-12 LAB — LACTIC ACID, PLASMA: Lactic Acid, Venous: 1.5 mmol/L (ref 0.5–1.9)

## 2023-01-12 MED ORDER — CEPHALEXIN 500 MG PO CAPS
500.0000 mg | ORAL_CAPSULE | Freq: Once | ORAL | Status: AC
  Administered 2023-01-12: 500 mg via ORAL
  Filled 2023-01-12: qty 1

## 2023-01-12 MED ORDER — HYDROCODONE-ACETAMINOPHEN 5-325 MG PO TABS
1.0000 | ORAL_TABLET | Freq: Once | ORAL | Status: AC
  Administered 2023-01-12: 1 via ORAL
  Filled 2023-01-12: qty 1

## 2023-01-12 MED ORDER — CEPHALEXIN 500 MG PO CAPS: 500.0000 mg | ORAL_CAPSULE | Freq: Three times a day (TID) | ORAL | 0 refills | Status: DC
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
## 2023-01-12 NOTE — Discharge Instructions (Addendum)
Continue with your previously prescribed gabapentin and Ultram.  Antibiotic as directed until all pills are gone.  Follow-up with your primary provider or return to the ED if needed.
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
## 2023-01-23 ENCOUNTER — Encounter: Payer: Self-pay | Admitting: *Deleted

## 2023-01-23 ENCOUNTER — Other Ambulatory Visit: Payer: Self-pay

## 2023-01-23 ENCOUNTER — Emergency Department
Admission: EM | Admit: 2023-01-23 | Discharge: 2023-01-23 | Disposition: A | Discharge status: Home / Self Care | Payer: 59 | Attending: Emergency Medicine | Admitting: Emergency Medicine

## 2023-01-23 DIAGNOSIS — L989 Disorder of the skin and subcutaneous tissue, unspecified: Secondary | ICD-10-CM | POA: Diagnosis not present

## 2023-01-23 DIAGNOSIS — R2242 Localized swelling, mass and lump, left lower limb: Secondary | ICD-10-CM | POA: Diagnosis present

## 2023-01-23 MED ORDER — DIPHENHYDRAMINE HCL 25 MG PO CAPS
25.0000 mg | ORAL_CAPSULE | Freq: Once | ORAL | Status: AC
  Administered 2023-01-23: 25 mg via ORAL
  Filled 2023-01-23: qty 1

## 2023-01-23 MED ORDER — HYDROCODONE-ACETAMINOPHEN 5-325 MG PO TABS
1.0000 | ORAL_TABLET | Freq: Once | ORAL | Status: AC
  Administered 2023-01-23: 1 via ORAL
  Filled 2023-01-23: qty 1

## 2023-01-23 MED ORDER — CEPHALEXIN 500 MG PO CAPS
500.0000 mg | ORAL_CAPSULE | Freq: Once | ORAL | Status: AC
  Administered 2023-01-23: 500 mg via ORAL
  Filled 2023-01-23: qty 1

## 2023-01-23 MED ORDER — CEPHALEXIN 500 MG PO CAPS: 500.0000 mg | ORAL_CAPSULE | Freq: Four times a day (QID) | ORAL | 0 refills | Status: AC

## 2023-01-23 NOTE — Discharge Instructions (Signed)
You were seen in the emergency department for an area of swelling on your leg.  This may be from a reaction to a bug bite or sting, or other trigger.  You can take Benadryl by mouth as needed to help with itchiness or try over-the-counter topical treatments such as hydrocortisone cream. Prescription for an antibiotic to your pharmacy to try and prevent an associated infection.  You can take Tylenol and ibuprofen as needed to help with pain.  Return to the ER for any new or worsening symptoms.
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
## 2023-01-23 NOTE — ED Provider Notes (Signed)
[facility] Provider Note    Event Date/Time   First MD Initiated Contact with Patient 01/23/23 1220     (approximate)   History   Leg Swelling   HPI  Victoria Gross is a 52 y.o. female presenting to the emergency department for evaluation of leg pain.  Patient has a history of lymphedema with chronic leg swelling, but reports that this morning she felt a sharp stinging type sensation over the posterior aspect of her left leg just above her knee and now has a burning pain and itching in the area.  Think she may have been bit or stung by something, but did not see what it was.  No fevers.  Does report foot swelling, but reports that this is not new for her.  No fevers.    Physical Exam   Triage Vital Signs: ED Triage Vitals  Enc Vitals Group     BP 01/23/23 1225 (!) 164/94     Pulse Rate 01/23/23 1225 97     Resp 01/23/23 1225 18     Temp 01/23/23 1225 99.8 F (37.7 C)     Temp Source 01/23/23 1225 Oral     SpO2 01/23/23 1225 99 %     Weight 01/23/23 1227 210 lb (95.3 kg)     Height 01/23/23 1227 4\' 11"  (1.499 m)     Head Circumference --      Peak Flow --      Pain Score 01/23/23 1226 9     Pain Loc --      Pain Edu? --      Excl. in GC? --     Most recent vital signs: Vitals:   01/23/23 1225  BP: (!) 164/94  Pulse: 97  Resp: 18  Temp: 99.8 F (37.7 C)  SpO2: 99%     General: Awake, interactive  CV:  Regular rate, good peripheral perfusion.  Resp:  Lungs clear, unlabored respirations.  Abd:  Soft, nondistended.  Neuro:  Symmetric facial movement, fluid speech MSK:  Contracture of the left upper extremity, there is swelling of the left foot compared to the contralateral side.  Somewhat difficult to the DP pulse on the left secondary to swelling, but confirmed via Doppler, palpable on the right.  Over the posterior aspect of the left thigh in the location of patient's pain there is a well-circumscribed indurated area with associated  tenderness to palpation.  No fluctuance.  No obvious bite mark noted.  No significant erythema appreciable, but limited evaluation due to patient's skin tone.  ED Results / Procedures / Treatments   Labs (all labs ordered are listed, but only abnormal results are displayed) Labs Reviewed - No data to display   EKG EKG independently reviewed interpreted by myself (ER attending) demonstrates:    RADIOLOGY Imaging independently reviewed and interpreted by myself demonstrates:    PROCEDURES:  Critical Care performed: No  Procedures   MEDICATIONS ORDERED IN ED: Medications  HYDROcodone-acetaminophen (NORCO/VICODIN) 5-325 MG per tablet 1 tablet (1 tablet Oral Given 01/23/23 1319)  diphenhydrAMINE (BENADRYL) capsule 25 mg (25 mg Oral Given 01/23/23 1319)  cephALEXin (KEFLEX) capsule 500 mg (500 mg Oral Given 01/23/23 1319)     IMPRESSION / MDM / ASSESSMENT AND PLAN / ED COURSE  I reviewed the triage vital signs and the nursing notes.  Differential diagnosis includes, but is not limited to, bug bite, localized contact dermatitis, possibly with developing cellulitis, clinical exam not consistent with abscess, presentation not  consistent with anaphylaxis  Patient's presentation is most consistent with acute complicated illness / injury requiring diagnostic workup.  52 year old female presenting with left posterior leg pain after possible insect bite.  Does have indurated area on exam.  Not overtly infected on exam, but given location of patient's limited mobility, do think it is at increased risk for this.  Will treat symptomatically with pain control, Benadryl, and empiric antibiotics.  Strict return precautions provided.  Patient discharged stable condition.      FINAL CLINICAL IMPRESSION(S) / ED DIAGNOSES   Final diagnoses:  Leg skin lesion, left     Rx / DC Orders   ED Discharge Orders          Ordered    cephALEXin (KEFLEX) 500 MG capsule  4 times daily         01/23/23 1328             Note:  This document was prepared using Dragon voice recognition software and may include unintentional dictation errors.   Trinna Post, MD 01/23/23 [phone]

## 2023-02-17 ENCOUNTER — Telehealth: Payer: Self-pay | Admitting: Gastroenterology

## 2023-02-17 NOTE — Telephone Encounter (Signed)
Patient is ready to reschedule her colonoscopy, she stated that she doesn't have a phone now. She can be reach right now on her nurse phone.

## 2023-03-22 ENCOUNTER — Ambulatory Visit: Admission: RE | Admit: 2023-03-22 | Payer: 59 | Source: Ambulatory Visit

## 2023-03-22 ENCOUNTER — Other Ambulatory Visit: Payer: Self-pay | Admitting: Family Medicine

## 2023-03-22 ENCOUNTER — Ambulatory Visit
Admission: RE | Admit: 2023-03-22 | Discharge: 2023-03-22 | Disposition: A | Discharge status: Home / Self Care | Payer: 59 | Source: Ambulatory Visit | Attending: Family Medicine | Admitting: Family Medicine

## 2023-03-22 DIAGNOSIS — N63 Unspecified lump in unspecified breast: Secondary | ICD-10-CM

## 2023-03-22 DIAGNOSIS — Z1239 Encounter for other screening for malignant neoplasm of breast: Secondary | ICD-10-CM | POA: Diagnosis not present

## 2023-03-22 DIAGNOSIS — R92323 Mammographic fibroglandular density, bilateral breasts: Secondary | ICD-10-CM | POA: Diagnosis not present

## 2023-04-20 ENCOUNTER — Telehealth: Payer: Self-pay

## 2023-04-20 NOTE — Telephone Encounter (Signed)
New patient visit with Dr. Herbie Baltimore on 04/29/2023.  Confirmed with patient no personal cardiac history.  Directions to the office provided.

## 2023-04-29 ENCOUNTER — Ambulatory Visit: Payer: 59 | Attending: Cardiology | Admitting: Cardiology

## 2023-04-29 NOTE — Progress Notes (Deleted)
  Cardiology Office Note:  .   Date:  04/29/2023  ID:  Victoria Gross, DOB [birth year], MRN [id number] PCP: Resa Miner, MD  [facility] Providers Cardiologist:  None { Click to update primary MD,subspecialty MD or APP then REFRESH:1}    No chief complaint on file.   Patient Profile: .     Victoria Gross is a morbidly obese 52 y.o. female  with a PMH notable for History of DVT who presents here for *** at the request of Cronk, Unk Pinto, MD.  Victoria Gross was last seen on ***      Subjective   INTERVAL HPI Discussed the use of AI scribe software for clinical note transcription with the patient, who gave verbal consent to proceed.  History of Present Illness           ROS:  Cardiovascular ROS: {roscv:310661} Review of Systems - {ros master:310782}     Objective   Studies Reviewed: Marland Kitchen        No prior cardiac studies CT Chest Abdomen Pelvis with Contrast 10/19/2022: No significant vascular findings.  Normal heart size. CTA Chest-PE (05/31/2022): No evidence of PE.  Scattered coronary calcification  Labs from 01/29/2023 CBC: W 6.0, H/H 11.5/30.5, Plt 208; ferritin 75  No results found for: "CHOL", "HDL", "LDLCALC", "LDLDIRECT", "TRIG", "CHOLHDL" Lab Results  Component Value Date   NA 140 01/12/2023   CL 105 01/12/2023   K 3.8 01/12/2023   CO2 27 01/12/2023   BUN 23 (H) 01/12/2023   CREATININE 0.81 01/12/2023   GFRNONAA >60 01/12/2023   CALCIUM 8.8 (L) 01/12/2023   PHOS 4.2 01/23/2022   ALBUMIN 3.6 01/12/2023   GLUCOSE 91 01/12/2023       Risk Assessment/Calculations:   {Does this patient have ATRIAL FIBRILLATION?:[id number]} No BP recorded.  {Refresh Note OR Click here to enter BP  :1}***         Physical Exam:   VS:  There were no vitals taken for this visit.   Wt Readings from Last 3 Encounters:  01/23/23 210 lb (95.3 kg)  01/12/23 200 lb (90.7 kg)  01/03/23 198 lb 6.6 oz (90 kg)    GEN: Obese; well nourished, well developed in no  acute distress; *** NECK: No JVD; No carotid bruits CARDIAC: Normal S1, S2; RRR, no murmurs, rubs, gallops RESPIRATORY:  Clear to auscultation without rales, wheezing or rhonchi ; nonlabored, good air movement. ABDOMEN: Soft, non-tender, non-distended EXTREMITIES:  No edema; No deformity      ASSESSMENT AND PLAN: .    Problem List Items Addressed This Visit   None     Assessment and Plan               {Are you ordering a CV Procedure (e.g. stress test, cath, DCCV, TEE, etc)?   Press F2        :[id number]}   Dispo: No follow-ups on file.  Total time spent: *** min spent with patient + ***(10) min spent charting = *** min      Signed, Marykay Lex, MD, MS Bryan Lemma, M.D., M.S. Interventional Cardiologist  [facility]  Pager # [phone] Phone # [phone] [address] [place], [place] [address]

## 2023-04-30 ENCOUNTER — Telehealth: Payer: Self-pay | Admitting: Acute Care

## 2023-04-30 ENCOUNTER — Encounter: Payer: Self-pay | Admitting: Cardiology

## 2023-04-30 NOTE — Telephone Encounter (Signed)
Returned call. No answer at home number.  Left message with friend on cell number.

## 2023-04-30 NOTE — Telephone Encounter (Signed)
Patient is calling to get an appointment scheduled with [facility] team.

## 2023-05-03 ENCOUNTER — Other Ambulatory Visit: Payer: Self-pay

## 2023-05-03 ENCOUNTER — Emergency Department
Admission: EM | Admit: 2023-05-03 | Discharge: 2023-05-03 | Disposition: A | Discharge status: Home / Self Care | Payer: 59 | Attending: Emergency Medicine | Admitting: Emergency Medicine

## 2023-05-03 ENCOUNTER — Encounter: Payer: Self-pay | Admitting: Intensive Care

## 2023-05-03 DIAGNOSIS — T148XXA Other injury of unspecified body region, initial encounter: Secondary | ICD-10-CM

## 2023-05-03 DIAGNOSIS — Z1152 Encounter for screening for COVID-19: Secondary | ICD-10-CM | POA: Diagnosis not present

## 2023-05-03 DIAGNOSIS — I10 Essential (primary) hypertension: Secondary | ICD-10-CM | POA: Diagnosis not present

## 2023-05-03 DIAGNOSIS — M545 Low back pain, unspecified: Secondary | ICD-10-CM | POA: Diagnosis present

## 2023-05-03 DIAGNOSIS — X58XXXA Exposure to other specified factors, initial encounter: Secondary | ICD-10-CM | POA: Insufficient documentation

## 2023-05-03 DIAGNOSIS — S39012A Strain of muscle, fascia and tendon of lower back, initial encounter: Secondary | ICD-10-CM | POA: Diagnosis not present

## 2023-05-03 LAB — URINALYSIS, ROUTINE W REFLEX MICROSCOPIC
Bilirubin Urine: NEGATIVE
Glucose, UA: NEGATIVE mg/dL
Hgb urine dipstick: NEGATIVE
Ketones, ur: 20 mg/dL — AB
Leukocytes,Ua: NEGATIVE
Nitrite: NEGATIVE
Protein, ur: NEGATIVE mg/dL
Specific Gravity, Urine: 1.013 (ref 1.005–1.030)
pH: 6 (ref 5.0–8.0)

## 2023-05-03 LAB — RESP PANEL BY RT-PCR (RSV, FLU A&B, COVID)  RVPGX2
Influenza A by PCR: NEGATIVE
Influenza B by PCR: NEGATIVE
Resp Syncytial Virus by PCR: NEGATIVE
SARS Coronavirus 2 by RT PCR: NEGATIVE

## 2023-05-03 MED ORDER — HYDROCODONE-ACETAMINOPHEN 5-325 MG PO TABS
1.0000 | ORAL_TABLET | Freq: Once | ORAL | Status: DC
  Filled 2023-05-03: qty 1

## 2023-05-03 MED ORDER — KETOROLAC TROMETHAMINE 30 MG/ML IJ SOLN
30.0000 mg | Freq: Once | INTRAMUSCULAR | Status: AC
  Administered 2023-05-03: 30 mg via INTRAMUSCULAR
  Filled 2023-05-03: qty 1

## 2023-05-03 MED ORDER — CYCLOBENZAPRINE HCL 5 MG PO TABS: 5.0000 mg | ORAL_TABLET | Freq: Three times a day (TID) | ORAL | 0 refills | Status: DC | PRN

## 2023-05-03 MED ORDER — DICLOFENAC SODIUM 50 MG PO TBEC: 50.0000 mg | DELAYED_RELEASE_TABLET | Freq: Two times a day (BID) | ORAL | 0 refills | Status: AC

## 2023-05-03 MED ORDER — HYDROCODONE-ACETAMINOPHEN 5-325 MG PO TABS: 1.0000 | ORAL_TABLET | Freq: Three times a day (TID) | ORAL | 0 refills | Status: AC | PRN

## 2023-05-03 MED ORDER — CYCLOBENZAPRINE HCL 10 MG PO TABS
5.0000 mg | ORAL_TABLET | Freq: Once | ORAL | Status: DC
  Filled 2023-05-03: qty 1

## 2023-05-03 MED ORDER — LIDOCAINE 5 % EX PTCH: 1.0000 | MEDICATED_PATCH | Freq: Two times a day (BID) | CUTANEOUS | 0 refills | Status: AC | PRN

## 2023-05-03 MED ORDER — LIDOCAINE 5 % EX PTCH
1.0000 | MEDICATED_PATCH | Freq: Once | CUTANEOUS | Status: DC
  Administered 2023-05-03: 1 via TRANSDERMAL
  Filled 2023-05-03: qty 1
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
## 2023-05-03 NOTE — Discharge Instructions (Addendum)
Your exam and urinalysis are negative at this time for any infection.  Symptoms likely represent musculoskeletal pain.  Take the prescription meds as directed.  Follow with your primary provider for ongoing concerns.  Return to ED if needed.

## 2023-05-03 NOTE — ED Provider Notes (Signed)
[facility] Emergency Department Provider Note     None    (approximate)   History   Back Pain   HPI  Victoria Gross is a 52 y.o. female with HTN,  obesity, left eye blindness, decreased motor Iletin, and left sided weakness presents with 2 days of LBP for evaluation. She denies any injury or trauma. No bladder/bowel incontinence, foot drop, or saddle anesthesias.   Physical Exam   Triage Vital Signs: ED Triage Vitals  Encounter Vitals Group     BP 05/03/23 1320 (!) 143/83     Systolic BP Percentile --      Diastolic BP Percentile --      Pulse Rate 05/03/23 1320 67     Resp 05/03/23 1320 16     Temp 05/03/23 1320 98.6 F (37 C)     Temp Source 05/03/23 1320 Oral     SpO2 05/03/23 1320 100 %     Weight 05/03/23 1321 200 lb (90.7 kg)     Height 05/03/23 1321 5' (1.524 m)     Head Circumference --      Peak Flow --      Pain Score 05/03/23 1321 10     Pain Loc --      Pain Education --      Exclude from Growth Chart --     Most recent vital signs: Vitals:   05/03/23 1320  BP: (!) 143/83  Pulse: 67  Resp: 16  Temp: 98.6 F (37 C)  SpO2: 100%    General Awake, no distress. NAD HEENT NCAT. PERRL. EOMI. No rhinorrhea. Mucous membranes are moist. * CV:  Good peripheral perfusion.  RESP:  Normal effort.  ABD:  No distention.  MSK:  No tenderness to palpation to the midline spine.  Patient tenderness to the left flank and the thoraco-lumbar  musculature.   ED Results / Procedures / Treatments   Labs (all labs ordered are listed, but only abnormal results are displayed) Labs Reviewed  URINALYSIS, ROUTINE W REFLEX MICROSCOPIC - Abnormal; Notable for the following components:      Result Value   Color, Urine YELLOW (*)    APPearance HAZY (*)    Ketones, ur 20 (*)    All other components within normal limits  RESP PANEL BY RT-PCR (RSV, FLU A&B, COVID)  RVPGX2    EKG   RADIOLOGY  No results found.   PROCEDURES:  Critical  Care performed: No  Procedures   MEDICATIONS ORDERED IN ED: Medications  lidocaine (LIDODERM) 5 % 1 patch (1 patch Transdermal Patch Applied 05/03/23 1702)  cyclobenzaprine (FLEXERIL) tablet 5 mg (has no administration in time range)  HYDROcodone-acetaminophen (NORCO/VICODIN) 5-325 MG per tablet 1 tablet (has no administration in time range)  ketorolac (TORADOL) 30 MG/ML injection 30 mg (30 mg Intramuscular Given 05/03/23 1544)     IMPRESSION / MDM / ASSESSMENT AND PLAN / ED COURSE  I reviewed the triage vital signs and the nursing notes.                              Differential diagnosis includes, but is not limited to, number strain, myalgias, UTI, kidney stones, lumbar radiculopathy  Patient's presentation is most consistent with acute complicated illness / injury requiring diagnostic workup.  Patient's diagnosis is consistent with lumbar strain.  No lab evidence of UTI.  Patient treated with ED medications including IM Toradol, p.o. Vicodin,  Flexeril, and Lidoderm patch.. patient will be discharged home with prescriptions for the same. Patient is to follow up with her primary provider and specialist as discussed, as needed or otherwise directed. Patient is given ED precautions to return to the ED for any worsening or new symptoms.  FINAL CLINICAL IMPRESSION(S) / ED DIAGNOSES   Final diagnoses:  Strain of lumbar region, initial encounter  Muscle strain     Rx / DC Orders   ED Discharge Orders          Ordered    cyclobenzaprine (FLEXERIL) 5 MG tablet  3 times daily PRN        05/03/23 1659    HYDROcodone-acetaminophen (NORCO) 5-325 MG tablet  3 times daily PRN        05/03/23 1659    lidocaine (LIDODERM) 5 %  Every 12 hours PRN        05/03/23 1659    diclofenac (VOLTAREN) 50 MG EC tablet  2 times daily        05/03/23 1659             Note:  This document was prepared using Dragon voice recognition software and may include unintentional dictation errors.     Lissa Hoard, PA-C 05/03/23 1735    Dionne Bucy, MD 05/04/23 0005

## 2023-05-10 ENCOUNTER — Encounter (INDEPENDENT_AMBULATORY_CARE_PROVIDER_SITE_OTHER): Payer: 59 | Admitting: Nurse Practitioner

## 2023-05-13 ENCOUNTER — Emergency Department
Admission: EM | Admit: 2023-05-13 | Discharge: 2023-05-13 | Disposition: A | Discharge status: Home / Self Care | Payer: 59 | Attending: Emergency Medicine | Admitting: Emergency Medicine

## 2023-05-13 ENCOUNTER — Other Ambulatory Visit: Payer: Self-pay

## 2023-05-13 ENCOUNTER — Emergency Department: Payer: 59

## 2023-05-13 DIAGNOSIS — R0602 Shortness of breath: Secondary | ICD-10-CM | POA: Diagnosis not present

## 2023-05-13 DIAGNOSIS — R1011 Right upper quadrant pain: Secondary | ICD-10-CM | POA: Insufficient documentation

## 2023-05-13 DIAGNOSIS — R079 Chest pain, unspecified: Secondary | ICD-10-CM

## 2023-05-13 DIAGNOSIS — R0789 Other chest pain: Secondary | ICD-10-CM | POA: Insufficient documentation

## 2023-05-13 LAB — CBC
HCT: 36.5 % (ref 36.0–46.0)
Hemoglobin: 11.2 g/dL — ABNORMAL LOW (ref 12.0–15.0)
MCH: 23.3 pg — ABNORMAL LOW (ref 26.0–34.0)
MCHC: 30.7 g/dL (ref 30.0–36.0)
MCV: 75.9 fL — ABNORMAL LOW (ref 80.0–100.0)
Platelets: 331 10*3/uL (ref 150–400)
RBC: 4.81 MIL/uL (ref 3.87–5.11)
RDW: 15.8 % — ABNORMAL HIGH (ref 11.5–15.5)
WBC: 7.5 10*3/uL (ref 4.0–10.5)
nRBC: 0 % (ref 0.0–0.2)

## 2023-05-13 LAB — COMPREHENSIVE METABOLIC PANEL
ALT: 14 U/L (ref 0–44)
AST: 22 U/L (ref 15–41)
Albumin: 3.7 g/dL (ref 3.5–5.0)
Alkaline Phosphatase: 84 U/L (ref 38–126)
Anion gap: 7 (ref 5–15)
BUN: 21 mg/dL — ABNORMAL HIGH (ref 6–20)
CO2: 28 mmol/L (ref 22–32)
Calcium: 8.8 mg/dL — ABNORMAL LOW (ref 8.9–10.3)
Chloride: 105 mmol/L (ref 98–111)
Creatinine, Ser: 1.06 mg/dL — ABNORMAL HIGH (ref 0.44–1.00)
GFR, Estimated: >60 mL/min (ref >60)
Glucose, Bld: 90 mg/dL (ref 70–99)
Potassium: 3.6 mmol/L (ref 3.5–5.1)
Sodium: 140 mmol/L (ref 135–145)
Total Bilirubin: 0.6 mg/dL (ref 0.3–1.2)
Total Protein: 8.5 g/dL — ABNORMAL HIGH (ref 6.5–8.1)

## 2023-05-13 LAB — LIPASE, BLOOD: Lipase: 28 U/L (ref 11–51)

## 2023-05-13 LAB — TROPONIN I (HIGH SENSITIVITY): Troponin I (High Sensitivity): 4 ng/L (ref <18)

## 2023-05-13 NOTE — ED Notes (Signed)
[facility]  CALLED  FOR  TRANSPORT  HOME

## 2023-05-13 NOTE — ED Provider Notes (Signed)
-----------------------------------------   5:59 PM on 05/13/2023 ----------------------------------------- Patient care assumed from Dr. Karilyn Cota.  Patient states she is feeling much better and states she is ready to go home.  She is asking for something to eat, will attempt to find some food for the patient.  Patient's medical workup is reassuring with a reassuring CBC negative troponin and reassuring chemistry.  Patient states her pain started 2 days ago given her negative troponin I believe this is sufficient to rule out ACS.  Lipase is normal ultrasound of the right upper quadrant shows status post cholecystectomy no acute process chest x-ray is clear.  Given the patient's reassuring workup as she is now symptom-free and is ready to go home I believe the patient could be safely discharged home with outpatient follow-up.  Patient agreeable to plan of care.  Will need EMS for transport.   Minna Antis, MD 05/13/23 1759

## 2023-05-13 NOTE — Discharge Instructions (Addendum)
You were seen in the ER today for evaluation of your chest pain.  Your testing was overall reassuring.  Follow-up with your primary care doctor or cardiology for further evaluation.  Return to the ER for new or worsening symptoms.

## 2023-05-13 NOTE — ED Provider Notes (Signed)
[facility] Provider Note    Event Date/Time   First MD Initiated Contact with Patient 05/13/23 1513     (approximate)   History   Chest Pain   HPI  Victoria Gross is a 52 year old female with history of anemia, weakness presenting to the emergency department for evaluation of chest pain.  Patient reports that yesterday she had onset of left-sided chest pain described as a sharp pain.  Has been persistent until today.  Denies history of similar.  Does additionally report some right upper abdominal pain, ongoing.  No nausea or vomiting.  Does report some mild shortness of breath.  No fevers or chills.      Physical Exam   Triage Vital Signs: ED Triage Vitals  Encounter Vitals Group     BP 05/13/23 1159 (!) 143/88     Systolic BP Percentile --      Diastolic BP Percentile --      Pulse Rate 05/13/23 1159 100     Resp 05/13/23 1159 19     Temp 05/13/23 1159 98.5 F (36.9 C)     Temp Source 05/13/23 1159 Oral     SpO2 05/13/23 1159 96 %     Weight 05/13/23 1200 198 lb 6.6 oz (90 kg)     Height 05/13/23 1200 5' (1.524 m)     Head Circumference --      Peak Flow --      Pain Score 05/13/23 1200 10     Pain Loc --      Pain Education --      Exclude from Growth Chart --     Most recent vital signs: Vitals:   05/13/23 1159  BP: (!) 143/88  Pulse: 100  Resp: 19  Temp: 98.5 F (36.9 C)  SpO2: 96%     General: Awake, interactive  CV:  Regular rate, good peripheral perfusion.  Resp:  Lungs clear, unlabored respirations.  Abd:  Soft, nondistended.  Neuro:  Symmetric facial movement, fluid speech   ED Results / Procedures / Treatments   Labs (all labs ordered are listed, but only abnormal results are displayed) Labs Reviewed  CBC - Abnormal; Notable for the following components:      Result Value   Hemoglobin 11.2 (*)    MCV 75.9 (*)    MCH 23.3 (*)    RDW 15.8 (*)    All other components within normal limits  COMPREHENSIVE  METABOLIC PANEL - Abnormal; Notable for the following components:   BUN 21 (*)    Creatinine, Ser 1.06 (*)    Calcium 8.8 (*)    Total Protein 8.5 (*)    All other components within normal limits  LIPASE, BLOOD  POC URINE PREG, ED  TROPONIN I (HIGH SENSITIVITY)  TROPONIN I (HIGH SENSITIVITY)     EKG EKG independently reviewed interpreted by myself (ER attending) demonstrates:  EKG demonstrate sinus tachycardia at a rate of 104, PR 170, QRS 96, QTc 502, no acute ST changes  RADIOLOGY Imaging independently reviewed and interpreted by myself demonstrates:  CXR without focal consolidation RUQ US demonstrates cholecystectomy  PROCEDURES:  Critical Care performed: No  Procedures   MEDICATIONS ORDERED IN ED: Medications - No data to display   IMPRESSION / MDM / ASSESSMENT AND PLAN / ED COURSE  I reviewed the triage vital signs and the nursing notes.  Differential diagnosis includes, but is not limited to, ACS, pneumonia, pneumothorax, biliary pathology, pancreatitis  Patient's presentation is  most consistent with acute presentation with potential threat to life or bodily function.  52 year old female presenting to the emergency department for evaluation of chest pain.  Vital signs stable on presentation.  Labs without critical derangement, demonstrate anemia and similar creatinine to prior.  Negative initial troponin and normal lipase.  Given pain starting this morning, will obtain repeat troponin.  RUQ US demonstrates cholecystectomy.  Chest x-Zac Torti without focal consolidation.  Signed out to oncoming provider pending repeat troponin and disposition.  If repeat troponin remains normal, suspect patient may be appropriate for discharge with outpatient follow-up.    FINAL CLINICAL IMPRESSION(S) / ED DIAGNOSES   Final diagnoses:  Nonspecific chest pain     Rx / DC Orders   ED Discharge Orders     None        Note:  This document was prepared using Dragon voice  recognition software and may include unintentional dictation errors.   Trinna Post, MD 05/13/23 [phone]

## 2023-05-13 NOTE — ED Notes (Signed)
This tech and EDT Judeth Cornfield and Eliberto Ivory cleaned pt up after urinary and bowel incontinence. Techs did peri care, changed the linen, and put pt in a new brief. Pt is now lying comfortably in bed.
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
## 2023-05-15 NOTE — Plan of Care (Signed)
CH[facility] Tonsillectomy/Adenoidectomy, Postoperative PEDS care plan entered in error.

## 2023-06-19 ENCOUNTER — Emergency Department
Admission: EM | Admit: 2023-06-19 | Discharge: 2023-06-19 | Disposition: A | Discharge status: Home / Self Care | Payer: 59 | Attending: Emergency Medicine | Admitting: Emergency Medicine

## 2023-06-19 ENCOUNTER — Emergency Department: Payer: 59

## 2023-06-19 ENCOUNTER — Other Ambulatory Visit: Payer: Self-pay

## 2023-06-19 DIAGNOSIS — E876 Hypokalemia: Secondary | ICD-10-CM | POA: Insufficient documentation

## 2023-06-19 DIAGNOSIS — S0990XA Unspecified injury of head, initial encounter: Secondary | ICD-10-CM | POA: Diagnosis present

## 2023-06-19 DIAGNOSIS — M545 Low back pain, unspecified: Secondary | ICD-10-CM | POA: Diagnosis not present

## 2023-06-19 DIAGNOSIS — I4581 Long QT syndrome: Secondary | ICD-10-CM | POA: Diagnosis not present

## 2023-06-19 DIAGNOSIS — W19XXXA Unspecified fall, initial encounter: Secondary | ICD-10-CM | POA: Diagnosis not present

## 2023-06-19 DIAGNOSIS — R9431 Abnormal electrocardiogram [ECG] [EKG]: Secondary | ICD-10-CM

## 2023-06-19 DIAGNOSIS — S299XXA Unspecified injury of thorax, initial encounter: Secondary | ICD-10-CM | POA: Insufficient documentation

## 2023-06-19 DIAGNOSIS — S3991XA Unspecified injury of abdomen, initial encounter: Secondary | ICD-10-CM | POA: Diagnosis not present

## 2023-06-19 LAB — COMPREHENSIVE METABOLIC PANEL
ALT: 16 U/L (ref 0–44)
AST: 15 U/L (ref 15–41)
Albumin: 4.1 g/dL (ref 3.5–5.0)
Alkaline Phosphatase: 94 U/L (ref 38–126)
Anion gap: 8 (ref 5–15)
BUN: 19 mg/dL (ref 6–20)
CO2: 26 mmol/L (ref 22–32)
Calcium: 9.2 mg/dL (ref 8.9–10.3)
Chloride: 105 mmol/L (ref 98–111)
Creatinine, Ser: 0.83 mg/dL (ref 0.44–1.00)
GFR, Estimated: >60 mL/min (ref >60)
Glucose, Bld: 92 mg/dL (ref 70–99)
Potassium: 3.1 mmol/L — ABNORMAL LOW (ref 3.5–5.1)
Sodium: 139 mmol/L (ref 135–145)
Total Bilirubin: 0.5 mg/dL (ref <1.2)
Total Protein: 8.5 g/dL — ABNORMAL HIGH (ref 6.5–8.1)

## 2023-06-19 LAB — CBC WITH DIFFERENTIAL/PLATELET
Abs Immature Granulocytes: 0.03 10*3/uL (ref 0.00–0.07)
Basophils Absolute: 0 10*3/uL (ref 0.0–0.1)
Basophils Relative: 0 %
Eosinophils Absolute: 0.1 10*3/uL (ref 0.0–0.5)
Eosinophils Relative: 1 %
HCT: 41.3 % (ref 36.0–46.0)
Hemoglobin: 12.5 g/dL (ref 12.0–15.0)
Immature Granulocytes: 0 %
Lymphocytes Relative: 26 %
Lymphs Abs: 2.4 10*3/uL (ref 0.7–4.0)
MCH: 23.1 pg — ABNORMAL LOW (ref 26.0–34.0)
MCHC: 30.3 g/dL (ref 30.0–36.0)
MCV: 76.3 fL — ABNORMAL LOW (ref 80.0–100.0)
Monocytes Absolute: 0.6 10*3/uL (ref 0.1–1.0)
Monocytes Relative: 6 %
Neutro Abs: 5.9 10*3/uL (ref 1.7–7.7)
Neutrophils Relative %: 67 %
Platelets: 287 10*3/uL (ref 150–400)
RBC: 5.41 MIL/uL — ABNORMAL HIGH (ref 3.87–5.11)
RDW: 16.5 % — ABNORMAL HIGH (ref 11.5–15.5)
WBC: 9 10*3/uL (ref 4.0–10.5)
nRBC: 0 % (ref 0.0–0.2)

## 2023-06-19 LAB — LIPASE, BLOOD: Lipase: 28 U/L (ref 11–51)

## 2023-06-19 LAB — TROPONIN I (HIGH SENSITIVITY)
Troponin I (High Sensitivity): 4 ng/L (ref <18)
Troponin I (High Sensitivity): 3 ng/L (ref <18)

## 2023-06-19 LAB — MAGNESIUM: Magnesium: 2 mg/dL (ref 1.7–2.4)

## 2023-06-19 MED ORDER — SODIUM CHLORIDE 0.9 % IV BOLUS
500.0000 mL | Freq: Once | INTRAVENOUS | Status: AC
  Administered 2023-06-19: 500 mL via INTRAVENOUS

## 2023-06-19 MED ORDER — LIDOCAINE 5 % EX PTCH: 1.0000 | MEDICATED_PATCH | Freq: Two times a day (BID) | CUTANEOUS | 0 refills | Status: AC

## 2023-06-19 MED ORDER — POTASSIUM CHLORIDE 10 MEQ/100ML IV SOLN
10.0000 meq | Freq: Once | INTRAVENOUS | Status: AC
  Administered 2023-06-19: 10 meq via INTRAVENOUS
  Filled 2023-06-19: qty 100

## 2023-06-19 MED ORDER — ACETAMINOPHEN 500 MG PO TABS
1000.0000 mg | ORAL_TABLET | Freq: Once | ORAL | Status: AC
  Administered 2023-06-19: 1000 mg via ORAL
  Filled 2023-06-19: qty 2

## 2023-06-19 MED ORDER — POTASSIUM CHLORIDE CRYS ER 20 MEQ PO TBCR
40.0000 meq | EXTENDED_RELEASE_TABLET | Freq: Once | ORAL | Status: AC
  Administered 2023-06-19: 40 meq via ORAL
  Filled 2023-06-19: qty 2

## 2023-06-19 MED ORDER — LIDOCAINE 5 % EX PTCH
1.0000 | MEDICATED_PATCH | CUTANEOUS | Status: DC
  Administered 2023-06-19: 1 via TRANSDERMAL
  Filled 2023-06-19: qty 1

## 2023-06-19 MED ORDER — IOHEXOL 300 MG/ML  SOLN
100.0000 mL | Freq: Once | INTRAMUSCULAR | Status: AC | PRN
  Administered 2023-06-19: 100 mL via INTRAVENOUS

## 2023-06-19 NOTE — Discharge Instructions (Addendum)
Your CT and x-rays did not show any signs of any fractures or injuries however you most likely just hurt a muscle.  I noticed on your EKGs today that you have some prolongation of your QTc.  You are on a few medications that can elongate this such as Seroquel, duloxetine, nortriptline  It is important that you discuss this with your primary doctor who is prescribing these and see if any of these can be down titrated or that they just continue to monitor it if you develop any syncope or near syncope need to return to the ER immediately for repeat evaluation.  Return to the ER for worsening symptoms or any other concerns  IMPRESSION: 1. No CT evidence of acute traumatic injury to the chest, abdomen, or pelvis. 2. No fracture or dislocation of the thoracic or lumbar spine. 3. Focally moderate disc space height loss and osteophytosis of L5-S1. 4. Status post cholecystectomy and appendectomy. 5. Coronary artery disease.
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
## 2023-06-19 NOTE — ED Notes (Addendum)
Ultrasound guided IV in right forearm is swollen and tender around insertion site. Potassium and bolus paused. IV has blood return noted. Ordering new ultrasound IV due to infiltration.
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
## 2023-06-19 NOTE — ED Notes (Signed)
Per charge RN Marylene Land, [facility] called back stating the EMS removed IV en route to home.

## 2023-07-08 ENCOUNTER — Ambulatory Visit (INDEPENDENT_AMBULATORY_CARE_PROVIDER_SITE_OTHER): Payer: 59 | Admitting: Nurse Practitioner

## 2023-07-08 ENCOUNTER — Telehealth (INDEPENDENT_AMBULATORY_CARE_PROVIDER_SITE_OTHER): Payer: Self-pay

## 2023-07-08 ENCOUNTER — Encounter (INDEPENDENT_AMBULATORY_CARE_PROVIDER_SITE_OTHER): Payer: Self-pay | Admitting: Nurse Practitioner

## 2023-07-08 VITALS — BP 142/92 | HR 88 | Resp 16

## 2023-07-08 DIAGNOSIS — R600 Localized edema: Secondary | ICD-10-CM | POA: Diagnosis not present

## 2023-07-08 DIAGNOSIS — I890 Lymphedema, not elsewhere classified: Secondary | ICD-10-CM | POA: Diagnosis not present

## 2023-07-08 DIAGNOSIS — G629 Polyneuropathy, unspecified: Secondary | ICD-10-CM | POA: Diagnosis not present

## 2023-07-08 NOTE — Telephone Encounter (Signed)
Reach out to [facility] requesting to start nursing visits for weekly left unna wrap. [facility] will reach out if they are not able take on patient.

## 2023-07-09 ENCOUNTER — Encounter (INDEPENDENT_AMBULATORY_CARE_PROVIDER_SITE_OTHER): Payer: Self-pay | Admitting: Nurse Practitioner

## 2023-07-09 ENCOUNTER — Other Ambulatory Visit (INDEPENDENT_AMBULATORY_CARE_PROVIDER_SITE_OTHER): Payer: Self-pay | Admitting: Nurse Practitioner

## 2023-07-09 DIAGNOSIS — I890 Lymphedema, not elsewhere classified: Secondary | ICD-10-CM
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
## 2023-07-15 ENCOUNTER — Encounter (INDEPENDENT_AMBULATORY_CARE_PROVIDER_SITE_OTHER): Payer: 59

## 2023-07-15 ENCOUNTER — Ambulatory Visit (INDEPENDENT_AMBULATORY_CARE_PROVIDER_SITE_OTHER): Payer: 59 | Admitting: Nurse Practitioner

## 2023-07-15 VITALS — BP 131/65 | HR 90 | Resp 16

## 2023-07-15 DIAGNOSIS — I890 Lymphedema, not elsewhere classified: Secondary | ICD-10-CM

## 2023-07-15 NOTE — Telephone Encounter (Signed)
Patient verbalized that she prefer to come in the office for nursing visits

## 2023-07-19 ENCOUNTER — Encounter (INDEPENDENT_AMBULATORY_CARE_PROVIDER_SITE_OTHER): Payer: Self-pay | Admitting: Nurse Practitioner

## 2023-07-19 NOTE — Progress Notes (Signed)
History of Present Illness  There is no documented history at this time  Assessments & Plan   1. Lymphedema (Primary) ***     Additional instructions  Subjective:  Patient presents with venous ulcer of the Left lower extremity.    Procedure:  3 layer unna wrap was placed Left lower extremity.   Plan:   Follow up in one week.

## 2023-07-23 ENCOUNTER — Encounter (INDEPENDENT_AMBULATORY_CARE_PROVIDER_SITE_OTHER): Payer: 59

## 2023-07-30 ENCOUNTER — Encounter (INDEPENDENT_AMBULATORY_CARE_PROVIDER_SITE_OTHER): Payer: Self-pay

## 2023-07-30 ENCOUNTER — Ambulatory Visit (INDEPENDENT_AMBULATORY_CARE_PROVIDER_SITE_OTHER): Payer: 59 | Admitting: Nurse Practitioner

## 2023-07-30 ENCOUNTER — Other Ambulatory Visit (INDEPENDENT_AMBULATORY_CARE_PROVIDER_SITE_OTHER): Payer: Self-pay | Admitting: Nurse Practitioner

## 2023-07-30 VITALS — BP 137/74 | HR 94 | Resp 18

## 2023-07-30 DIAGNOSIS — I890 Lymphedema, not elsewhere classified: Secondary | ICD-10-CM | POA: Diagnosis not present

## 2023-07-30 NOTE — Progress Notes (Signed)
 History of Present Illness  There is no documented history at this time  Assessments & Plan   There are no diagnoses linked to this encounter.    Additional instructions  Subjective:  Patient presents with venous ulcer of the Left lower extremity.    Procedure:  3 layer unna wrap was placed Left lower extremity.   Plan:   Follow up in one week.

## 2023-07-31 ENCOUNTER — Encounter: Payer: Self-pay | Admitting: Emergency Medicine

## 2023-07-31 ENCOUNTER — Other Ambulatory Visit: Payer: Self-pay

## 2023-07-31 ENCOUNTER — Emergency Department
Admission: EM | Admit: 2023-07-31 | Discharge: 2023-07-31 | Disposition: A | Discharge status: Home / Self Care | Payer: 59 | Attending: Emergency Medicine | Admitting: Emergency Medicine

## 2023-07-31 DIAGNOSIS — M7989 Other specified soft tissue disorders: Secondary | ICD-10-CM | POA: Diagnosis present

## 2023-07-31 DIAGNOSIS — R600 Localized edema: Secondary | ICD-10-CM | POA: Insufficient documentation

## 2023-07-31 LAB — CBC WITH DIFFERENTIAL/PLATELET
Abs Immature Granulocytes: 0.02 10*3/uL (ref 0.00–0.07)
Basophils Absolute: 0 10*3/uL (ref 0.0–0.1)
Basophils Relative: 0 %
Eosinophils Absolute: 0.3 10*3/uL (ref 0.0–0.5)
Eosinophils Relative: 5 %
HCT: 36 % (ref 36.0–46.0)
Hemoglobin: 11.2 g/dL — ABNORMAL LOW (ref 12.0–15.0)
Immature Granulocytes: 0 %
Lymphocytes Relative: 28 %
Lymphs Abs: 1.9 10*3/uL (ref 0.7–4.0)
MCH: 23.6 pg — ABNORMAL LOW (ref 26.0–34.0)
MCHC: 31.1 g/dL (ref 30.0–36.0)
MCV: 75.8 fL — ABNORMAL LOW (ref 80.0–100.0)
Monocytes Absolute: 0.6 10*3/uL (ref 0.1–1.0)
Monocytes Relative: 9 %
Neutro Abs: 4 10*3/uL (ref 1.7–7.7)
Neutrophils Relative %: 58 %
Platelets: 277 10*3/uL (ref 150–400)
RBC: 4.75 MIL/uL (ref 3.87–5.11)
RDW: 17.2 % — ABNORMAL HIGH (ref 11.5–15.5)
WBC: 6.8 10*3/uL (ref 4.0–10.5)
nRBC: 0 % (ref 0.0–0.2)

## 2023-07-31 LAB — BASIC METABOLIC PANEL
Anion gap: 13 (ref 5–15)
BUN: 21 mg/dL — ABNORMAL HIGH (ref 6–20)
CO2: 22 mmol/L (ref 22–32)
Calcium: 8.8 mg/dL — ABNORMAL LOW (ref 8.9–10.3)
Chloride: 102 mmol/L (ref 98–111)
Creatinine, Ser: 0.98 mg/dL (ref 0.44–1.00)
GFR, Estimated: >60 mL/min (ref >60)
Glucose, Bld: 118 mg/dL — ABNORMAL HIGH (ref 70–99)
Potassium: 3.1 mmol/L — ABNORMAL LOW (ref 3.5–5.1)
Sodium: 137 mmol/L (ref 135–145)

## 2023-07-31 MED ORDER — ACETAMINOPHEN 500 MG PO TABS
ORAL_TABLET | ORAL | Status: AC
  Filled 2023-07-31: qty 2

## 2023-07-31 MED ORDER — POTASSIUM CHLORIDE 20 MEQ PO PACK
40.0000 meq | PACK | Freq: Two times a day (BID) | ORAL | Status: DC
  Administered 2023-07-31: 40 meq via ORAL
  Filled 2023-07-31: qty 2

## 2023-07-31 NOTE — ED Triage Notes (Signed)
 Patient to ED via [facility] from home for bilateral leg pain. Pt reports that left leg is wrapped due to lymphedema. States pain ongoing "for a while." Also c/o back pain.

## 2023-07-31 NOTE — ED Notes (Signed)
[facility] has arrived

## 2023-07-31 NOTE — ED Notes (Signed)
 Pt

## 2023-07-31 NOTE — ED Provider Triage Note (Signed)
 Emergency Medicine Provider Triage Evaluation Note  Victoria Gross , a 53 y.o. female  was evaluated in triage.  Pt complains of bilateral leg pain  Review of Systems  Positive:  Negative:   Physical Exam  BP 130/63 (BP Location: Right Arm)   Pulse (!) 107   Temp 98.2 F (36.8 C) (Oral)   Resp 18   Ht 5' (1.524 m)   Wt 95.3 kg   SpO2 100%   BMI 41.01 kg/m  Gen:   Awake, no distress   Resp:  Normal effort MSK:   Moves extremities without difficulty  Other:    Medical Decision Making  Medically screening exam initiated at 3:23 PM.  Appropriate orders placed.  Victoria Gross was informed that the remainder of the evaluation will be completed by another provider, this initial triage assessment does not replace that evaluation, and the importance of remaining in the ED until their evaluation is complete.     Janit Kast, PA-C 07/31/23 1524

## 2023-07-31 NOTE — Discharge Instructions (Signed)
 Please seek medical attention for any high fevers, chest pain, shortness of breath, change in behavior, persistent vomiting, bloody stool or any other new or concerning symptoms.

## 2023-07-31 NOTE — ED Notes (Signed)
 LLE calf knee to toes dressed with fluff kerlex and coban pt reports comfort

## 2023-07-31 NOTE — ED Provider Notes (Signed)
 [facility] Provider Note    Event Date/Time   First MD Initiated Contact with Patient 07/31/23 1620     (approximate)   History   Leg Pain   HPI  Victoria Gross is a 53 y.o. female who presents to the emergency department today with primary concern that the wrapping to her left leg is too tight.  Patient unfortunately continued to fall asleep during my exam so cannot give great history.  Per chart review patient has seen vascular surgery for lymphedema.  She also complains of back pain that goes from side-to-side.  It is unclear if that started yesterday or today.  Per chart review she has been seen for lumbar strain in the past.  Patient cannot give an answer why she is so sleepy.     Physical Exam   Triage Vital Signs: ED Triage Vitals  Encounter Vitals Group     BP 07/31/23 1517 130/63     Systolic BP Percentile --      Diastolic BP Percentile --      Pulse Rate 07/31/23 1517 (!) 107     Resp 07/31/23 1517 18     Temp 07/31/23 1517 98.2 F (36.8 C)     Temp Source 07/31/23 1517 Oral     SpO2 07/31/23 1517 100 %     Weight 07/31/23 1518 210 lb (95.3 kg)     Height 07/31/23 1518 5' (1.524 m)     Head Circumference --      Peak Flow --      Pain Score 07/31/23 1518 10     Pain Loc --      Pain Education --      Exclude from Growth Chart --     Most recent vital signs: Vitals:   07/31/23 1517  BP: 130/63  Pulse: (!) 107  Resp: 18  Temp: 98.2 F (36.8 C)  SpO2: 100%   General: Somnolent, awakens to verbal stimuli then falls back asleep CV:  Good peripheral perfusion.  Resp:  Normal effort.  Abd:  No distention.  Other:  Bilateral lower extremity lymphadema, left leg with wrapping. After removal of wrapping no erythema or skin breakdown.    ED Results / Procedures / Treatments   Labs (all labs ordered are listed, but only abnormal results are displayed) Labs Reviewed  CBC WITH DIFFERENTIAL/PLATELET - Abnormal; Notable for the  following components:      Result Value   Hemoglobin 11.2 (*)    MCV 75.8 (*)    MCH 23.6 (*)    RDW 17.2 (*)    All other components within normal limits  BASIC METABOLIC PANEL - Abnormal; Notable for the following components:   Potassium 3.1 (*)    Glucose, Bld 118 (*)    BUN 21 (*)    Calcium 8.8 (*)    All other components within normal limits     EKG  None   RADIOLOGY None  PROCEDURES:  Critical Care performed: No    MEDICATIONS ORDERED IN ED: Medications - No data to display   IMPRESSION / MDM / ASSESSMENT AND PLAN / ED COURSE  I reviewed the triage vital signs and the nursing notes.                              Differential diagnosis includes, but is not limited to, infection, lymphedema, lumbar strain  Patient's presentation is most consistent  with acute presentation with potential threat to life or bodily function.  Patient presented to the emergency department today with primary concern that the wrapping to her left leg was too tight.  Patient however cannot give a good history at this time.  Initial vital signs notable for tachycardia.  Blood work without leukocytosis.  After unwrapping the leg there is no skin breakdown or wound there.  I do think this is likely the patient has chronic lymphedema.  Discussed this with the patient.  Will plan on discharging to continue outpatient follow up.   FINAL CLINICAL IMPRESSION(S) / ED DIAGNOSES   Final diagnoses:  Leg swelling     Note:  This document was prepared using Dragon voice recognition software and may include unintentional dictation errors.    Floy Roberts, MD 07/31/23 2147

## 2023-08-03 ENCOUNTER — Telehealth (INDEPENDENT_AMBULATORY_CARE_PROVIDER_SITE_OTHER): Payer: Self-pay

## 2023-08-03 NOTE — Telephone Encounter (Signed)
 [facility] called stating they have received the referral for Victoria Gross, however she is requesting for them to come out 08/04/22. She would have had her wrap changed on the 10th.[facility] just needs a verbal order stating it's fine.   Please advise

## 2023-08-03 NOTE — Telephone Encounter (Signed)
 Message given

## 2023-08-03 NOTE — Telephone Encounter (Signed)
 That is fine

## 2023-08-05 ENCOUNTER — Ambulatory Visit: Payer: 59 | Attending: Cardiology | Admitting: Cardiology

## 2023-08-05 ENCOUNTER — Encounter: Payer: Self-pay | Admitting: Cardiology

## 2023-08-05 VITALS — BP 136/84 | Temp 98.4°F | Ht 60.0 in | Wt 210.0 lb

## 2023-08-05 DIAGNOSIS — I890 Lymphedema, not elsewhere classified: Secondary | ICD-10-CM

## 2023-08-05 DIAGNOSIS — R600 Localized edema: Secondary | ICD-10-CM | POA: Diagnosis not present

## 2023-08-05 DIAGNOSIS — R0789 Other chest pain: Secondary | ICD-10-CM

## 2023-08-05 DIAGNOSIS — R9431 Abnormal electrocardiogram [ECG] [EKG]: Secondary | ICD-10-CM | POA: Diagnosis not present

## 2023-08-05 DIAGNOSIS — I10 Essential (primary) hypertension: Secondary | ICD-10-CM | POA: Insufficient documentation

## 2023-08-05 DIAGNOSIS — R0602 Shortness of breath: Secondary | ICD-10-CM | POA: Insufficient documentation

## 2023-08-05 NOTE — Assessment & Plan Note (Signed)
 She is describing dyspnea which is probably related as mostly pulmonary and obesity issues.  She has not doing any exertion.  Unfortunately the lack of being able to exercise would likely lead to some weight gain.
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
## 2023-08-05 NOTE — Assessment & Plan Note (Signed)
 Somewhat abnormal EKG with at least 1-lead position possibly.  MI, there is also previous EKG which showed prolonged QT was not seen on this EKG.  She has unilateral swelling, no PND orthopnea.  With her having some dyspnea issues and some fluttering, will check a 2D echo just to ensure no structural abnormalities or signs of cardiomyopathy.SABRA

## 2023-08-05 NOTE — Assessment & Plan Note (Addendum)
 Reproducible left-sided chest wall pain noncardiac in nature.  Most consistent costochondritis.  She does not have consistent chest pain episodes and I think some of the discomfort she describes a relatively inconsistent and infrequent.  Chest Pain is Likely musculoskeletal in nature (costochondritis) given reproducibility with palpation. Brief episodes of palpitations with associated discomfort, resolving with rest and inhaler use. No associated dizziness or syncope. -Order echocardiogram to evaluate cardiac function and rule out heart failure. -Consider CT scan of heart arteries if chest pain persists.

## 2023-08-05 NOTE — Patient Instructions (Addendum)
 Medication Instructions:  - No changes *If you need a refill on your cardiac medications before your next appointment, please call your pharmacy*  Lab Work: - None ordered  Testing/Procedures: Your physician has requested that you have an echocardiogram. Echocardiography is a painless test that uses sound waves to create images of your heart. It provides your doctor with information about the size and shape of your heart and how well your heart's chambers and valves are working. This procedure takes approximately one hour. There are no restrictions for this procedure. Please do NOT wear cologne, perfume, aftershave, or lotions (deodorant is allowed). Please arrive 15 minutes prior to your appointment time.  Please note: We ask at that you not bring children with you during ultrasound (echo/ vascular) testing. Due to room size and safety concerns, children are not allowed in the ultrasound rooms during exams. Our front office staff cannot provide observation of children in our lobby area while testing is being conducted. An adult accompanying a patient to their appointment will only be allowed in the ultrasound room at the discretion of the ultrasound technician under special circumstances. We apologize for any inconvenience.   Follow-Up: At [facility], you and your health needs are our priority.  As part of our continuing mission to provide you with exceptional heart care, we have created designated Provider Care Teams.  These Care Teams include your primary Cardiologist (physician) and Advanced Practice Providers (APPs -  Physician Assistants and Nurse Practitioners) who all work together to provide you with the care you need, when you need it.  Your next appointment:   6 month(s)  Provider:   You may see Alm Clay, MD or one of the following Advanced Practice Providers on your designated Care Team:   Lonni Meager, NP Bernardino Bring, PA-C Cadence Franchester, PA-C Tylene Lunch,  NP Barnie Hila, NP

## 2023-08-05 NOTE — Assessment & Plan Note (Addendum)
 Likely multifactorial, potassium related to her asthma (potentially COPD), as well as obesity and deconditioning. She is managing with albuterol  inhaler as needed. -Continue albuterol  inhaler as needed for symptoms. -Will check 2D echo to exclude cardiomyopathy.
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
## 2023-08-05 NOTE — Assessment & Plan Note (Signed)
 Patient blood pressure reading today was relatively high.  Only on amlodipine  5 mg daily.  Previously reviewed chart shows that her blood pressures may have been high outside case but not always.  Will continue to monitor.  Truthfully, this can be managed by PCP since she is only on 1 medication

## 2023-08-06 ENCOUNTER — Telehealth (INDEPENDENT_AMBULATORY_CARE_PROVIDER_SITE_OTHER): Payer: Self-pay

## 2023-08-06 ENCOUNTER — Encounter (INDEPENDENT_AMBULATORY_CARE_PROVIDER_SITE_OTHER): Payer: 59

## 2023-08-06 NOTE — Telephone Encounter (Signed)
 Pam home health nurse with [facility] left a message stating that she has tried multiple times to schedule nursing visits for unna wrap this week. The nurse will try today to go out to today to change unna wrap.

## 2023-08-09 ENCOUNTER — Ambulatory Visit (INDEPENDENT_AMBULATORY_CARE_PROVIDER_SITE_OTHER): Payer: 59 | Admitting: Nurse Practitioner

## 2023-08-12 ENCOUNTER — Ambulatory Visit (INDEPENDENT_AMBULATORY_CARE_PROVIDER_SITE_OTHER): Payer: 59 | Admitting: Nurse Practitioner

## 2023-08-13 ENCOUNTER — Ambulatory Visit (INDEPENDENT_AMBULATORY_CARE_PROVIDER_SITE_OTHER): Payer: 59

## 2023-08-17 ENCOUNTER — Other Ambulatory Visit: Payer: Self-pay | Admitting: Cardiology

## 2023-08-17 ENCOUNTER — Ambulatory Visit (INDEPENDENT_AMBULATORY_CARE_PROVIDER_SITE_OTHER): Payer: 59 | Admitting: Nurse Practitioner

## 2023-08-17 DIAGNOSIS — I890 Lymphedema, not elsewhere classified: Secondary | ICD-10-CM

## 2023-08-17 DIAGNOSIS — R600 Localized edema: Secondary | ICD-10-CM

## 2023-08-17 DIAGNOSIS — R0789 Other chest pain: Secondary | ICD-10-CM

## 2023-08-17 DIAGNOSIS — R0602 Shortness of breath: Secondary | ICD-10-CM

## 2023-08-17 DIAGNOSIS — I10 Essential (primary) hypertension: Secondary | ICD-10-CM

## 2023-08-17 DIAGNOSIS — E66813 Obesity, class 3: Secondary | ICD-10-CM

## 2023-08-17 DIAGNOSIS — R9431 Abnormal electrocardiogram [ECG] [EKG]: Secondary | ICD-10-CM

## 2023-08-18 ENCOUNTER — Telehealth (INDEPENDENT_AMBULATORY_CARE_PROVIDER_SITE_OTHER): Payer: Self-pay
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
## 2023-08-20 ENCOUNTER — Ambulatory Visit: Payer: 59 | Attending: Cardiology

## 2023-08-20 ENCOUNTER — Encounter (INDEPENDENT_AMBULATORY_CARE_PROVIDER_SITE_OTHER): Payer: Self-pay | Admitting: Nurse Practitioner

## 2023-08-20 ENCOUNTER — Ambulatory Visit (INDEPENDENT_AMBULATORY_CARE_PROVIDER_SITE_OTHER): Payer: 59 | Admitting: Nurse Practitioner

## 2023-08-20 VITALS — BP 154/98 | HR 108 | Resp 20

## 2023-08-20 DIAGNOSIS — I890 Lymphedema, not elsewhere classified: Secondary | ICD-10-CM | POA: Diagnosis not present

## 2023-08-20 NOTE — Progress Notes (Unsigned)
History of Present Illness  There is no documented history at this time  Assessments & Plan   There are no diagnoses linked to this encounter.    Additional instructions  Subjective:  Patient presents with venous ulcer of the Left lower extremity.    Procedure:  3 layer unna wrap was placed Left lower extremity.   Plan:   Follow up in one week.

## 2023-08-23 ENCOUNTER — Encounter (INDEPENDENT_AMBULATORY_CARE_PROVIDER_SITE_OTHER): Payer: Self-pay | Admitting: Nurse Practitioner

## 2023-08-27 ENCOUNTER — Encounter (INDEPENDENT_AMBULATORY_CARE_PROVIDER_SITE_OTHER): Payer: 59

## 2023-09-02 ENCOUNTER — Ambulatory Visit (INDEPENDENT_AMBULATORY_CARE_PROVIDER_SITE_OTHER): Payer: 59 | Admitting: Nurse Practitioner

## 2023-09-04 ENCOUNTER — Emergency Department: Payer: 59

## 2023-09-04 ENCOUNTER — Other Ambulatory Visit: Payer: Self-pay

## 2023-09-04 ENCOUNTER — Emergency Department
Admission: EM | Admit: 2023-09-04 | Discharge: 2023-09-04 | Disposition: A | Discharge status: Home / Self Care | Payer: 59 | Attending: Emergency Medicine | Admitting: Emergency Medicine

## 2023-09-04 DIAGNOSIS — W050XXA Fall from non-moving wheelchair, initial encounter: Secondary | ICD-10-CM | POA: Diagnosis not present

## 2023-09-04 DIAGNOSIS — M25562 Pain in left knee: Secondary | ICD-10-CM

## 2023-09-04 DIAGNOSIS — W19XXXA Unspecified fall, initial encounter: Secondary | ICD-10-CM

## 2023-09-04 DIAGNOSIS — Y92009 Unspecified place in unspecified non-institutional (private) residence as the place of occurrence of the external cause: Secondary | ICD-10-CM

## 2023-09-04 DIAGNOSIS — I10 Essential (primary) hypertension: Secondary | ICD-10-CM | POA: Insufficient documentation

## 2023-09-04 DIAGNOSIS — M25561 Pain in right knee: Secondary | ICD-10-CM | POA: Diagnosis present

## 2023-09-04 LAB — CBC WITH DIFFERENTIAL/PLATELET
Abs Immature Granulocytes: 0.02 10*3/uL (ref 0.00–0.07)
Basophils Absolute: 0 10*3/uL (ref 0.0–0.1)
Basophils Relative: 1 %
Eosinophils Absolute: 0.1 10*3/uL (ref 0.0–0.5)
Eosinophils Relative: 1 %
HCT: 41.8 % (ref 36.0–46.0)
Hemoglobin: 13.2 g/dL (ref 12.0–15.0)
Immature Granulocytes: 0 %
Lymphocytes Relative: 17 %
Lymphs Abs: 0.9 10*3/uL (ref 0.7–4.0)
MCH: 23.1 pg — ABNORMAL LOW (ref 26.0–34.0)
MCHC: 31.6 g/dL (ref 30.0–36.0)
MCV: 73.2 fL — ABNORMAL LOW (ref 80.0–100.0)
Monocytes Absolute: 0.8 10*3/uL (ref 0.1–1.0)
Monocytes Relative: 15 %
Neutro Abs: 3.4 10*3/uL (ref 1.7–7.7)
Neutrophils Relative %: 66 %
Platelets: 317 10*3/uL (ref 150–400)
RBC: 5.71 MIL/uL — ABNORMAL HIGH (ref 3.87–5.11)
RDW: 15.9 % — ABNORMAL HIGH (ref 11.5–15.5)
WBC: 5.2 10*3/uL (ref 4.0–10.5)
nRBC: 0 % (ref 0.0–0.2)

## 2023-09-04 LAB — COMPREHENSIVE METABOLIC PANEL
ALT: 23 U/L (ref 0–44)
AST: 24 U/L (ref 15–41)
Albumin: 3.9 g/dL (ref 3.5–5.0)
Alkaline Phosphatase: 79 U/L (ref 38–126)
Anion gap: 11 (ref 5–15)
BUN: 11 mg/dL (ref 6–20)
CO2: 24 mmol/L (ref 22–32)
Calcium: 9.1 mg/dL (ref 8.9–10.3)
Chloride: 101 mmol/L (ref 98–111)
Creatinine, Ser: 0.75 mg/dL (ref 0.44–1.00)
GFR, Estimated: >60 mL/min (ref >60)
Glucose, Bld: 95 mg/dL (ref 70–99)
Potassium: 3.3 mmol/L — ABNORMAL LOW (ref 3.5–5.1)
Sodium: 136 mmol/L (ref 135–145)
Total Bilirubin: 0.4 mg/dL (ref 0.0–1.2)
Total Protein: 8.4 g/dL — ABNORMAL HIGH (ref 6.5–8.1)

## 2023-09-04 MED ORDER — GABAPENTIN 300 MG PO CAPS
400.0000 mg | ORAL_CAPSULE | ORAL | Status: AC
  Administered 2023-09-04: 400 mg via ORAL
  Filled 2023-09-04: qty 1

## 2023-09-04 MED ORDER — NAPROXEN 500 MG PO TABS
500.0000 mg | ORAL_TABLET | Freq: Once | ORAL | Status: AC
  Administered 2023-09-04: 500 mg via ORAL
  Filled 2023-09-04: qty 1

## 2023-09-04 NOTE — ED Triage Notes (Signed)
 Arrived by [facility] from home. Reports fall a few days ago. C/o bilateral leg swelling and pain.   EMS vitals: 101HR 100% RA 120/82b/p

## 2023-09-04 NOTE — ED Provider Notes (Signed)
 [facility] Provider Note    Event Date/Time   First MD Initiated Contact with Patient 09/04/23 1418     (approximate)   History   Chief Complaint: Fall   HPI  MAHITHA HICKLING is a 53 y.o. female with history of anxiety, hypertension, alcohol abuse who complains of pain in bilateral knees after a fall at home few days ago.  Was try to stand up out of her wheelchair to walk at home when she lost her balance.  No head injury.  No other complaints.          Physical Exam   Triage Vital Signs: ED Triage Vitals  Encounter Vitals Group     BP 09/04/23 1230 (!) 134/93     Systolic BP Percentile --      Diastolic BP Percentile --      Pulse Rate 09/04/23 1230 98     Resp 09/04/23 1230 20     Temp 09/04/23 1230 98.6 F (37 C)     Temp Source 09/04/23 1230 Oral     SpO2 --      Weight 09/04/23 1232 210 lb 1.6 oz (95.3 kg)     Height --      Head Circumference --      Peak Flow --      Pain Score 09/04/23 1232 5     Pain Loc --      Pain Education --      Exclude from Growth Chart --     Most recent vital signs: Vitals:   09/04/23 1230  BP: (!) 134/93  Pulse: 98  Resp: 20  Temp: 98.6 F (37 C)    General: Awake, no distress.  CV:  Good peripheral perfusion.  Resp:  Normal effort.  Abd:  No distention.  Other:  Tenderness of right distal femur and bilateral knees. Joints stable   ED Results / Procedures / Treatments   Labs (all labs ordered are listed, but only abnormal results are displayed) Labs Reviewed  CBC WITH DIFFERENTIAL/PLATELET - Abnormal; Notable for the following components:      Result Value   RBC 5.71 (*)    MCV 73.2 (*)    MCH 23.1 (*)    RDW 15.9 (*)    All other components within normal limits  COMPREHENSIVE METABOLIC PANEL - Abnormal; Notable for the following components:   Potassium 3.3 (*)    Total Protein 8.4 (*)    All other components within normal limits     EKG    RADIOLOGY X-ray left knee  negative for fracture.  X-ray right femur interpreted by me, unremarkable. Radiology report reviewed   PROCEDURES:  Procedures   MEDICATIONS ORDERED IN ED: Medications  naproxen  (NAPROSYN ) tablet 500 mg (500 mg Oral Given 09/04/23 1545)     IMPRESSION / MDM / ASSESSMENT AND PLAN / ED COURSE  I reviewed the triage vital signs and the nursing notes.  DDx: Left knee fracture, right distal femur fracture, anemia, AKI, electrolyte derangement  Patient's presentation is most consistent with acute presentation with potential threat to life or bodily function.  Presents with bilateral knee pain after mechanical fall at home.  Currently listed, not intoxicated, x-rays negative, labs okay, NSAIDs for pain.       FINAL CLINICAL IMPRESSION(S) / ED DIAGNOSES   Final diagnoses:  Fall in home, initial encounter  Acute pain of both knees     Rx / DC Orders   ED Discharge  Orders     None        Note:  This document was prepared using Dragon voice recognition software and may include unintentional dictation errors.   Viviann Pastor, MD 09/04/23 [phone]

## 2023-09-04 NOTE — ED Notes (Addendum)
 The pt was transferred to the bed without incident. The pt was cleaned and placed in a new brief. The pt's call bell was left at the bedside.

## 2023-09-04 NOTE — Discharge Instructions (Addendum)
 Your lab tests and x-rays today were all okay.  Please follow-up with your doctor.

## 2023-09-04 NOTE — ED Notes (Signed)
 Patient resting.  Waiting for transportation.  Medication given for pain.  No complaints of shortness of breath, chest pain.

## 2023-09-17 ENCOUNTER — Encounter (INDEPENDENT_AMBULATORY_CARE_PROVIDER_SITE_OTHER): Payer: Self-pay | Admitting: Nurse Practitioner

## 2023-09-17 ENCOUNTER — Ambulatory Visit (INDEPENDENT_AMBULATORY_CARE_PROVIDER_SITE_OTHER): Payer: 59 | Admitting: Nurse Practitioner

## 2023-09-17 VITALS — BP 122/78 | HR 86 | Resp 16

## 2023-09-17 DIAGNOSIS — I890 Lymphedema, not elsewhere classified: Secondary | ICD-10-CM

## 2023-09-17 DIAGNOSIS — R531 Weakness: Secondary | ICD-10-CM | POA: Diagnosis not present

## 2023-09-17 DIAGNOSIS — I10 Essential (primary) hypertension: Secondary | ICD-10-CM | POA: Diagnosis not present
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
## 2023-10-01 ENCOUNTER — Ambulatory Visit (INDEPENDENT_AMBULATORY_CARE_PROVIDER_SITE_OTHER): Payer: 59 | Admitting: Nurse Practitioner

## 2023-10-04 ENCOUNTER — Ambulatory Visit: Attending: Family Medicine | Admitting: Physical Therapy

## 2023-10-06 ENCOUNTER — Ambulatory Visit: Payer: 59 | Attending: Cardiology
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
## 2023-10-07 ENCOUNTER — Ambulatory Visit

## 2023-10-11 ENCOUNTER — Ambulatory Visit

## 2023-10-13 ENCOUNTER — Telehealth: Payer: Self-pay | Admitting: Cardiology

## 2023-10-13 NOTE — Telephone Encounter (Signed)
 Left a message for the patient to call back.

## 2023-10-13 NOTE — Telephone Encounter (Signed)
 Called patient to schedule echocardiogram that patient no showed in January. Patient would like a nurse to call to discuss why she needs to be seeing a cardiologist.

## 2023-10-14 ENCOUNTER — Ambulatory Visit

## 2023-10-14 ENCOUNTER — Ambulatory Visit (INDEPENDENT_AMBULATORY_CARE_PROVIDER_SITE_OTHER): Admitting: Nurse Practitioner

## 2023-10-14 NOTE — Telephone Encounter (Signed)
 Pt called and VM left for call back

## 2023-10-15 NOTE — Telephone Encounter (Signed)
 Attempted to contact patient to discuss.  Unable to reach patient.  LVM to call back if still needed.   3rd attempt- will remove from triage. Thanks!

## 2023-10-19 ENCOUNTER — Ambulatory Visit

## 2023-10-21 ENCOUNTER — Ambulatory Visit

## 2023-10-26 ENCOUNTER — Ambulatory Visit

## 2023-10-28 ENCOUNTER — Ambulatory Visit

## 2023-11-01 ENCOUNTER — Ambulatory Visit

## 2023-11-03 ENCOUNTER — Ambulatory Visit: Admitting: Physical Therapy

## 2023-11-04 ENCOUNTER — Other Ambulatory Visit: Payer: Self-pay

## 2023-11-04 ENCOUNTER — Emergency Department
Admission: EM | Admit: 2023-11-04 | Discharge: 2023-11-05 | Disposition: A | Discharge status: Home / Self Care | Attending: Emergency Medicine | Admitting: Emergency Medicine

## 2023-11-04 ENCOUNTER — Emergency Department

## 2023-11-04 DIAGNOSIS — M25561 Pain in right knee: Secondary | ICD-10-CM | POA: Insufficient documentation

## 2023-11-04 DIAGNOSIS — S92351A Displaced fracture of fifth metatarsal bone, right foot, initial encounter for closed fracture: Secondary | ICD-10-CM | POA: Insufficient documentation

## 2023-11-04 DIAGNOSIS — M79631 Pain in right forearm: Secondary | ICD-10-CM | POA: Insufficient documentation

## 2023-11-04 DIAGNOSIS — Y92410 Unspecified street and highway as the place of occurrence of the external cause: Secondary | ICD-10-CM | POA: Diagnosis not present

## 2023-11-04 DIAGNOSIS — Y9355 Activity, bike riding: Secondary | ICD-10-CM | POA: Insufficient documentation

## 2023-11-04 DIAGNOSIS — I10 Essential (primary) hypertension: Secondary | ICD-10-CM | POA: Diagnosis not present

## 2023-11-04 DIAGNOSIS — S99921A Unspecified injury of right foot, initial encounter: Secondary | ICD-10-CM | POA: Diagnosis present
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
## 2023-11-05 ENCOUNTER — Emergency Department

## 2023-11-05 DIAGNOSIS — S92351A Displaced fracture of fifth metatarsal bone, right foot, initial encounter for closed fracture: Secondary | ICD-10-CM | POA: Diagnosis not present

## 2023-11-05 MED ORDER — OXYCODONE-ACETAMINOPHEN 5-325 MG PO TABS
1.0000 | ORAL_TABLET | Freq: Once | ORAL | Status: AC
  Administered 2023-11-05: 1 via ORAL
  Filled 2023-11-05: qty 1

## 2023-11-05 NOTE — ED Notes (Signed)
 [facility] called to transport patient back to her home spoke with Elita Quick

## 2023-11-05 NOTE — ED Notes (Signed)
 Pt's sister Antoniette was called and updated about pt per pt's request.
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
## 2023-11-05 NOTE — ED Notes (Signed)
 [facility] has arrived to transport patient to her home

## 2023-11-05 NOTE — ED Notes (Signed)
 [facility] called to transport patient back to her home

## 2023-11-09 ENCOUNTER — Ambulatory Visit

## 2023-11-11 ENCOUNTER — Ambulatory Visit
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
## 2023-11-15 ENCOUNTER — Ambulatory Visit: Admitting: Physical Therapy

## 2023-11-17 ENCOUNTER — Ambulatory Visit: Admitting: Physical Therapy

## 2023-11-23 ENCOUNTER — Ambulatory Visit: Admitting: Physical Therapy

## 2023-11-24 ENCOUNTER — Emergency Department: Admission: EM | Admit: 2023-11-24 | Discharge: 2023-11-24 | Disposition: A | Discharge status: Home / Self Care

## 2023-11-24 ENCOUNTER — Other Ambulatory Visit: Payer: Self-pay

## 2023-11-24 DIAGNOSIS — I10 Essential (primary) hypertension: Secondary | ICD-10-CM | POA: Diagnosis not present

## 2023-11-24 DIAGNOSIS — M79602 Pain in left arm: Secondary | ICD-10-CM | POA: Diagnosis present

## 2023-11-24 MED ORDER — OXYCODONE HCL 5 MG PO TABS
5.0000 mg | ORAL_TABLET | Freq: Once | ORAL | Status: AC
  Administered 2023-11-24: 5 mg via ORAL
  Filled 2023-11-24: qty 1

## 2023-11-24 MED ORDER — HYDROCODONE-ACETAMINOPHEN 5-325 MG PO TABS
1.0000 | ORAL_TABLET | Freq: Once | ORAL | Status: AC
  Administered 2023-11-24: 1 via ORAL
  Filled 2023-11-24: qty 1

## 2023-11-24 MED ORDER — HYDROCODONE-ACETAMINOPHEN 5-325 MG PO TABS: 1.0000 | ORAL_TABLET | Freq: Four times a day (QID) | ORAL | 0 refills | Status: AC | PRN
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
## 2023-11-24 NOTE — ED Notes (Signed)
 [facility] CALLED FOR  TRANSPORT HOME
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
## 2023-11-26 ENCOUNTER — Ambulatory Visit: Admitting: Physical Therapy

## 2023-11-30 ENCOUNTER — Ambulatory Visit: Admitting: Physical Therapy

## 2023-12-03 ENCOUNTER — Ambulatory Visit: Admitting: Physical Therapy

## 2023-12-07 ENCOUNTER — Ambulatory Visit: Admitting: Physical Therapy

## 2023-12-10 ENCOUNTER — Ambulatory Visit: Admitting: Physical Therapy

## 2023-12-14 ENCOUNTER — Ambulatory Visit: Admitting: Physical Therapy

## 2023-12-17 ENCOUNTER — Ambulatory Visit: Admitting: Physical Therapy

## 2023-12-21 ENCOUNTER — Ambulatory Visit: Admitting: Physical Therapy

## 2023-12-21 IMAGING — US US EXTREM LOW VENOUS*L*
1 series · 13 of 24 positions shown · non-contrast
Comparison: LEFT foot XRs, 02/06/2021.

CLINICAL DATA: LEFT leg swelling.

EXAM:
LEFT LOWER EXTREMITY VENOUS DOPPLER ULTRASOUND
TECHNIQUE: Gray-scale sonography with compression, as well as color and duplex
ultrasound, were performed to evaluate the deep venous system(s)
from the level of the common femoral vein through the popliteal and
proximal calf veins.

[Series 1: us venous img lower uni left (dvt) · portal-venous · 13 of 39 slices shown]
[im 1/39]
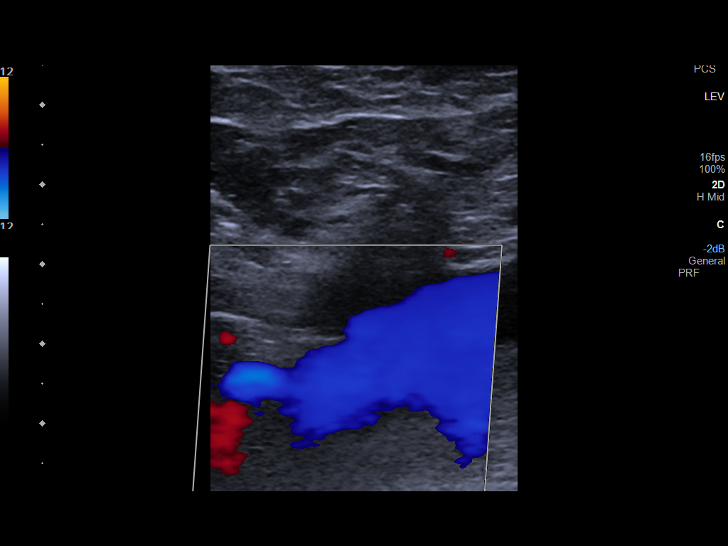
[im 4/39]
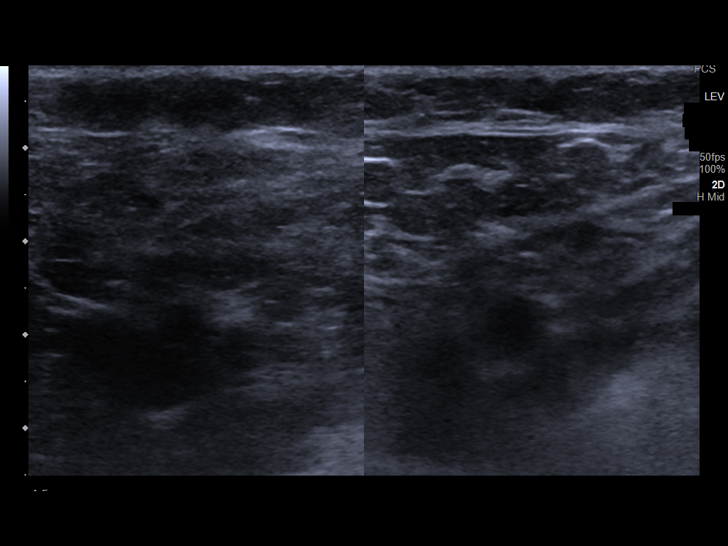
[im 7/39]
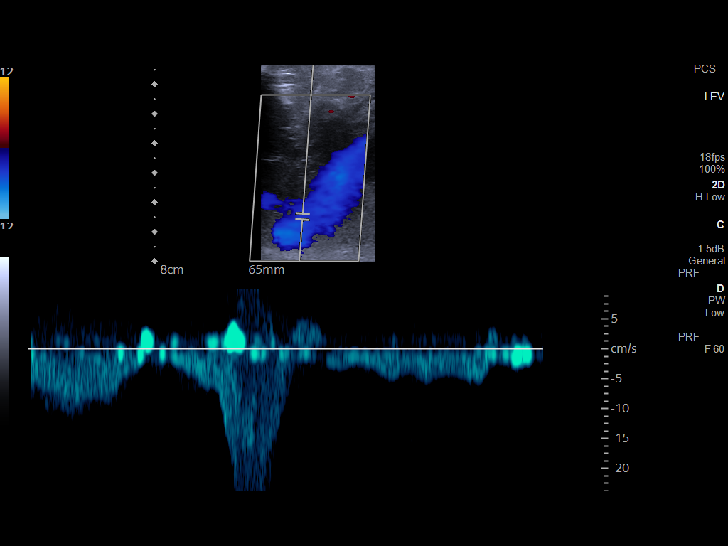
[im 10/39]
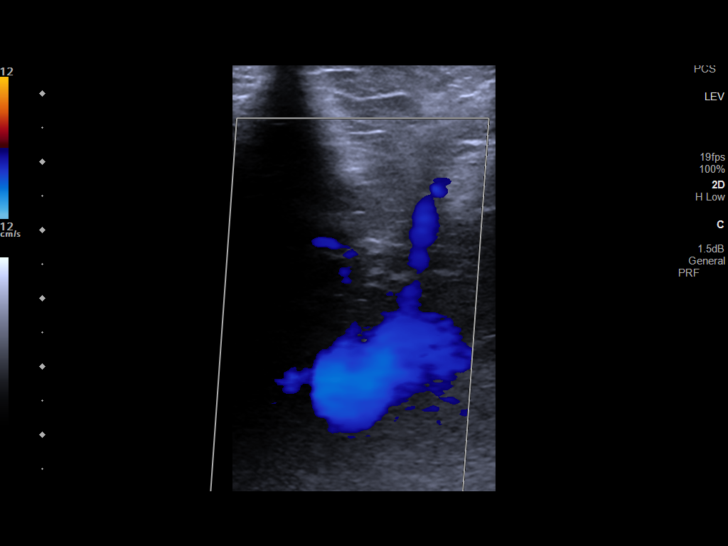
[im 14/39]
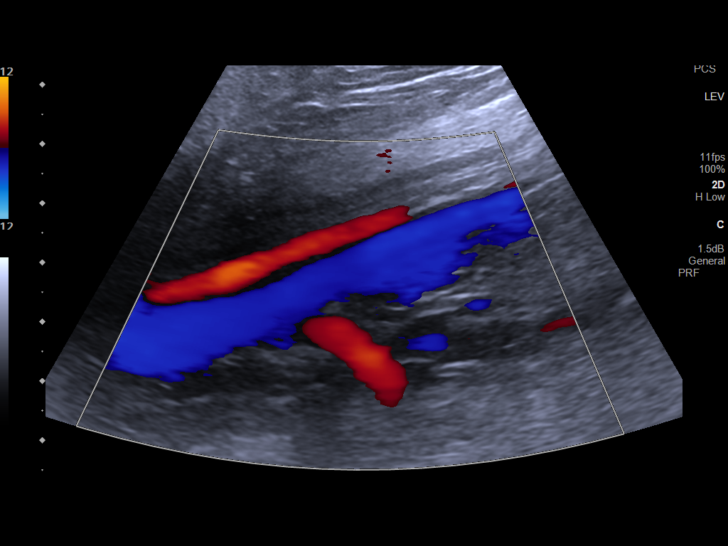
[im 17/39]
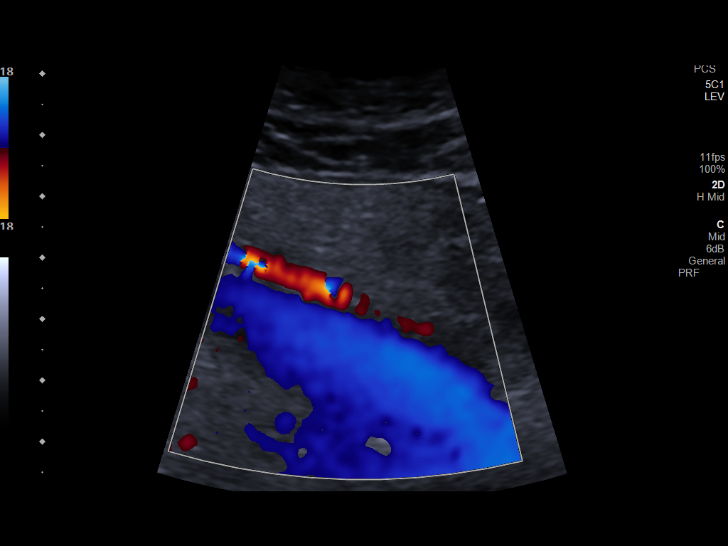
[im 20/39]
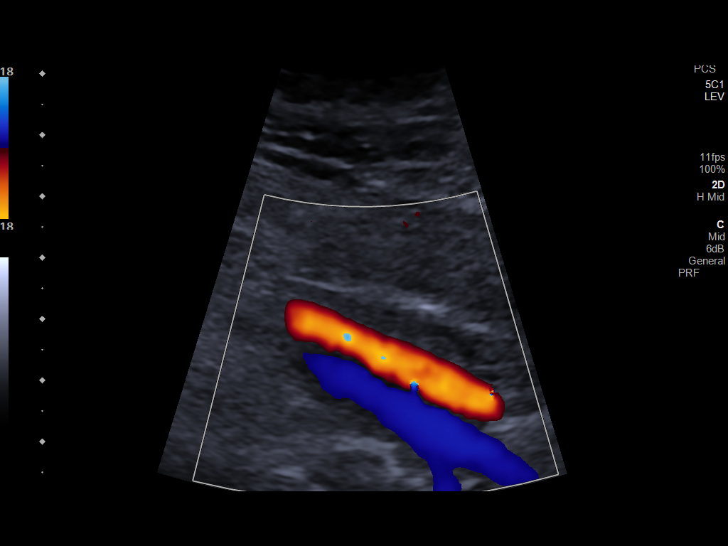
[im 22/39]
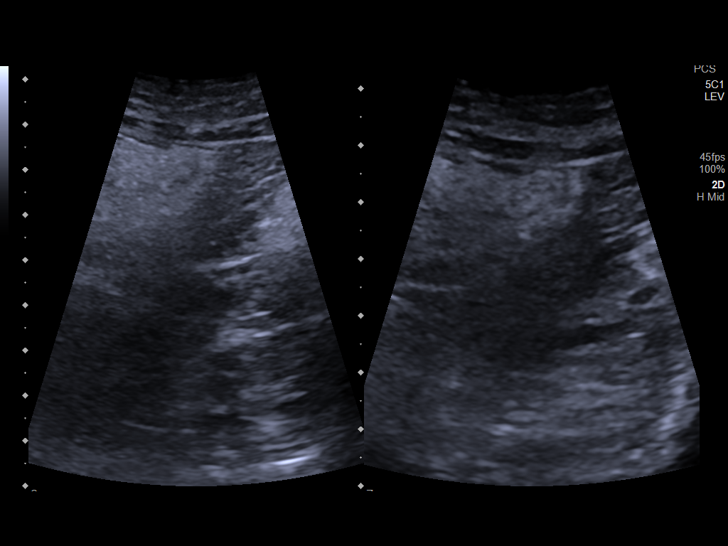
[im 25/39]
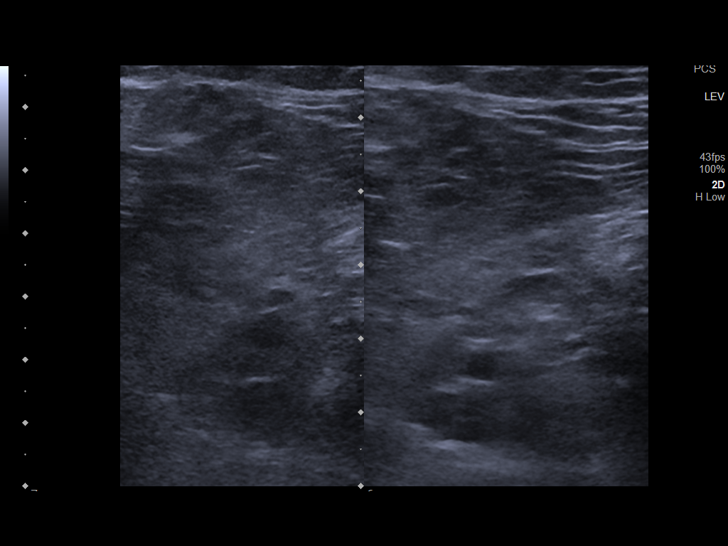
[im 29/39]
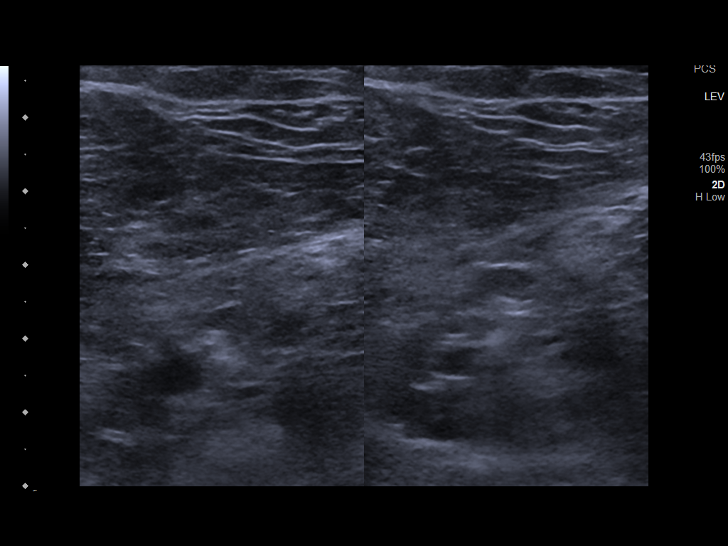
[im 32/39]
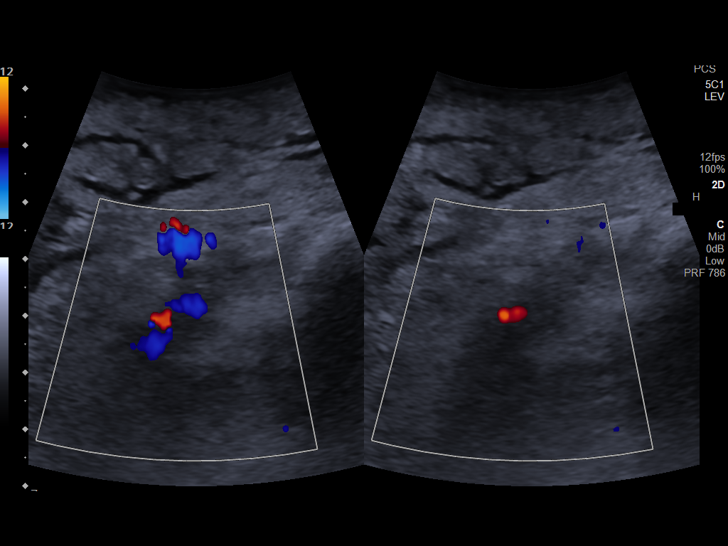
[im 35/39]
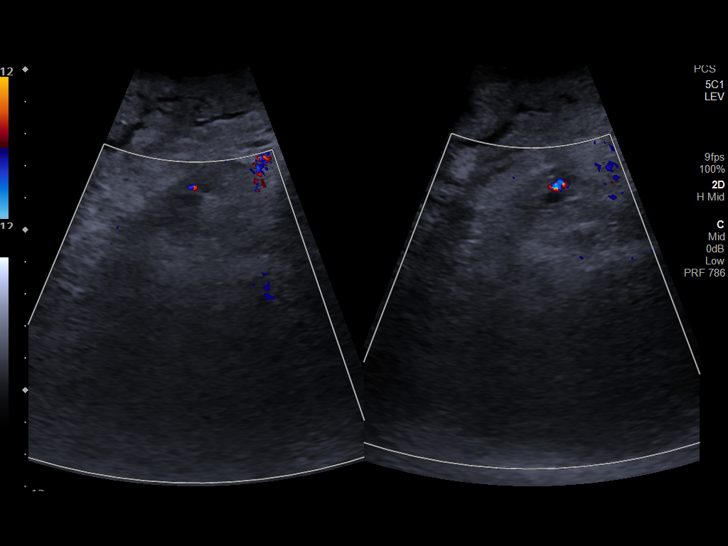
[im 39/39]
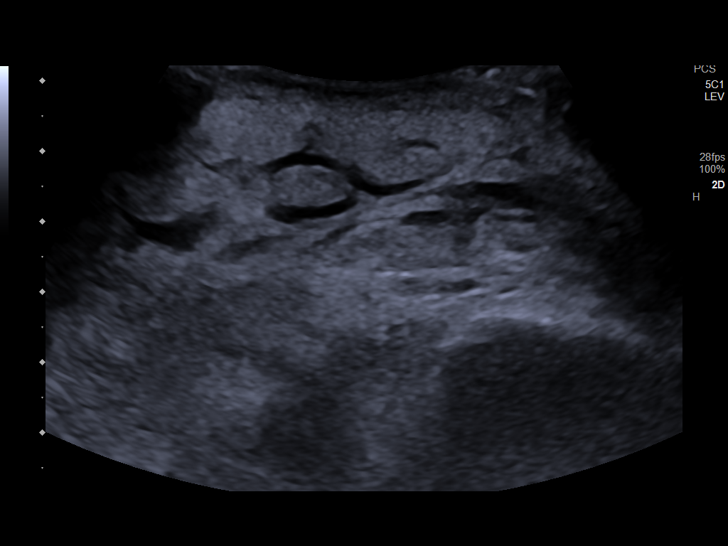

[13 of 24 positions shown; findings below may reference images not displayed]

FINDINGS: Suboptimal evaluation, with poor acoustic penetration secondary to
patient habitus.

VENOUS

Normal compressibility of the LEFT common femoral, superficial
femoral, and popliteal veins, as well as the visualized calf veins.
Visualized portions of profunda femoral vein and great saphenous
vein unremarkable. No filling defects to suggest DVT on grayscale or
color Doppler imaging. Doppler waveforms show normal direction of
venous flow, normal respiratory plasticity and response to
augmentation.

Limited views of the contralateral common femoral vein are
unremarkable.

OTHER

No evidence of superficial thrombophlebitis or abnormal fluid
collection.

Subcutaneous edema of the imaged distal LEFT lower extremity.

Limitations: Patient body habitus
IMPRESSION: Suboptimal evaluation, within these constraints;

No evidence of femoropopliteal DVT within the LEFT lower extremity.

## 2023-12-24 ENCOUNTER — Ambulatory Visit: Admitting: Physical Therapy

## 2023-12-28 ENCOUNTER — Ambulatory Visit: Admitting: Physical Therapy

## 2023-12-31 ENCOUNTER — Ambulatory Visit: Admitting: Physical Therapy

## 2024-01-04 ENCOUNTER — Ambulatory Visit: Admitting: Physical Therapy
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
## 2024-01-07 ENCOUNTER — Ambulatory Visit: Admitting: Physical Therapy

## 2024-01-11 ENCOUNTER — Ambulatory Visit: Admitting: Physical Therapy

## 2024-01-14 ENCOUNTER — Ambulatory Visit: Admitting: Physical Therapy

## 2024-01-18 ENCOUNTER — Ambulatory Visit: Admitting: Physical Therapy

## 2024-01-21 ENCOUNTER — Ambulatory Visit: Admitting: Physical Therapy

## 2024-01-22 ENCOUNTER — Emergency Department
Admission: EM | Admit: 2024-01-22 | Discharge: 2024-01-22 | Disposition: A | Discharge status: Home / Self Care | Attending: Emergency Medicine | Admitting: Emergency Medicine

## 2024-01-22 ENCOUNTER — Other Ambulatory Visit: Payer: Self-pay

## 2024-01-22 ENCOUNTER — Emergency Department

## 2024-01-22 DIAGNOSIS — I10 Essential (primary) hypertension: Secondary | ICD-10-CM | POA: Insufficient documentation

## 2024-01-22 DIAGNOSIS — N390 Urinary tract infection, site not specified: Secondary | ICD-10-CM | POA: Diagnosis not present

## 2024-01-22 DIAGNOSIS — R1084 Generalized abdominal pain: Secondary | ICD-10-CM

## 2024-01-22 LAB — CBC
HCT: 39.6 % (ref 36.0–46.0)
Hemoglobin: 12.2 g/dL (ref 12.0–15.0)
MCH: 23.6 pg — ABNORMAL LOW (ref 26.0–34.0)
MCHC: 30.8 g/dL (ref 30.0–36.0)
MCV: 76.6 fL — ABNORMAL LOW (ref 80.0–100.0)
Platelets: 301 10*3/uL (ref 150–400)
RBC: 5.17 MIL/uL — ABNORMAL HIGH (ref 3.87–5.11)
RDW: 16.3 % — ABNORMAL HIGH (ref 11.5–15.5)
WBC: 5.5 10*3/uL (ref 4.0–10.5)
nRBC: 0 % (ref 0.0–0.2)

## 2024-01-22 LAB — URINALYSIS, ROUTINE W REFLEX MICROSCOPIC
Bilirubin Urine: NEGATIVE
Glucose, UA: NEGATIVE mg/dL
Hgb urine dipstick: NEGATIVE
Ketones, ur: NEGATIVE mg/dL
Nitrite: POSITIVE — AB
Protein, ur: NEGATIVE mg/dL
Specific Gravity, Urine: 1.009 (ref 1.005–1.030)
pH: 7 (ref 5.0–8.0)

## 2024-01-22 LAB — COMPREHENSIVE METABOLIC PANEL WITH GFR
ALT: 12 U/L (ref 0–44)
AST: 14 U/L — ABNORMAL LOW (ref 15–41)
Albumin: 3.7 g/dL (ref 3.5–5.0)
Alkaline Phosphatase: 75 U/L (ref 38–126)
Anion gap: 9 (ref 5–15)
BUN: 12 mg/dL (ref 6–20)
CO2: 28 mmol/L (ref 22–32)
Calcium: 9.3 mg/dL (ref 8.9–10.3)
Chloride: 105 mmol/L (ref 98–111)
Creatinine, Ser: 0.62 mg/dL (ref 0.44–1.00)
GFR, Estimated: >60 mL/min (ref >60)
Glucose, Bld: 80 mg/dL (ref 70–99)
Potassium: 4 mmol/L (ref 3.5–5.1)
Sodium: 142 mmol/L (ref 135–145)
Total Bilirubin: 0.9 mg/dL (ref 0.0–1.2)
Total Protein: 8.2 g/dL — ABNORMAL HIGH (ref 6.5–8.1)

## 2024-01-22 LAB — LIPASE, BLOOD: Lipase: 28 U/L (ref 11–51)

## 2024-01-22 MED ORDER — SODIUM CHLORIDE 0.9 % IV BOLUS
500.0000 mL | Freq: Once | INTRAVENOUS | Status: AC
  Administered 2024-01-22: 500 mL via INTRAVENOUS

## 2024-01-22 MED ORDER — CEPHALEXIN 500 MG PO CAPS: 500.0000 mg | ORAL_CAPSULE | Freq: Two times a day (BID) | ORAL | 0 refills | Status: AC

## 2024-01-22 MED ORDER — MORPHINE SULFATE (PF) 4 MG/ML IV SOLN
4.0000 mg | Freq: Once | INTRAVENOUS | Status: AC
  Administered 2024-01-22: 4 mg via INTRAVENOUS
  Filled 2024-01-22: qty 1

## 2024-01-22 MED ORDER — CEPHALEXIN 500 MG PO CAPS
500.0000 mg | ORAL_CAPSULE | Freq: Once | ORAL | Status: AC
  Administered 2024-01-22: 500 mg via ORAL
  Filled 2024-01-22: qty 1

## 2024-01-22 MED ORDER — IOHEXOL 300 MG/ML  SOLN
100.0000 mL | Freq: Once | INTRAMUSCULAR | Status: AC | PRN
  Administered 2024-01-22: 100 mL via INTRAVENOUS

## 2024-01-22 MED ORDER — ONDANSETRON HCL 4 MG/2ML IJ SOLN
4.0000 mg | Freq: Once | INTRAMUSCULAR | Status: AC
  Administered 2024-01-22: 4 mg via INTRAVENOUS
  Filled 2024-01-22: qty 2

## 2024-01-22 MED ORDER — FAMOTIDINE 20 MG PO TABS: 20.0000 mg | ORAL_TABLET | Freq: Two times a day (BID) | ORAL | 0 refills | Status: AC

## 2024-01-22 NOTE — ED Notes (Signed)
 MD Siadecki spoke with patients family regarding dc and plan of care.

## 2024-01-22 NOTE — ED Notes (Signed)
 Patient to CT via stretcher.

## 2024-01-22 NOTE — ED Notes (Signed)
 Transport arrival. Patient cleaned from [facility] and urine incontinence. Clean brief placed and chucks for transport home.

## 2024-01-22 NOTE — ED Notes (Signed)
Patient provided box meal and ice water.

## 2024-01-22 NOTE — ED Triage Notes (Signed)
 Pt to ED via [facility] from home. Pt reports went out drinking last pm with hx of cirrhosis. Pt reports is now having abd pain. Pt is wheelchair bound.   145/78 87 HR  100% RA

## 2024-01-22 NOTE — Discharge Instructions (Signed)
 Antibiotic as prescribed and finish the full course.  You may take the Pepcid  up to twice daily as needed as this will help decrease acid in your stomach and hopefully decrease your stomach pain.  Avoid alcohol.  Follow-up with your primary care provider.  Return to the ER for new, worsening, or persistent severe abdominal pain, vomiting, fever, weakness, or any other new or worsening symptoms that concern you.

## 2024-01-22 NOTE — ED Provider Notes (Signed)
 [facility] Provider Note    Event Date/Time   First MD Initiated Contact with Patient 01/22/24 1021     (approximate)   History   Abdominal Pain   HPI  DENESE MENTINK is a 53 y.o. female with a history of hypertension, anemia, seizures, hepatitis C, cirrhosis who presents with who presents with abdominal pain, acute onset this morning, severe intensity, diffuse throughout her abdomen but somewhat worse in the lower abdomen.  She reports associated nausea but no vomiting, and has not had any diarrhea.  She states that she has not had a bowel movement in 2 days.  She reports drinking heavily last night.  I reviewed the past medical records.  The patient's most recent outpatient encounter in our system was with vascular surgery on 2/21 for evaluation of lower extremity edema.  She has no recent hospitalizations.   Physical Exam   Triage Vital Signs: ED Triage Vitals  Encounter Vitals Group     BP 01/22/24 1007 (!) 145/91     Girls Systolic BP Percentile --      Girls Diastolic BP Percentile --      Boys Systolic BP Percentile --      Boys Diastolic BP Percentile --      Pulse Rate 01/22/24 1007 96     Resp 01/22/24 1007 20     Temp 01/22/24 1007 97.9 F (36.6 C)     Temp Source 01/22/24 1007 Oral     SpO2 01/22/24 1007 98 %     Weight --      Height --      Head Circumference --      Peak Flow --      Pain Score 01/22/24 0949 6     Pain Loc --      Pain Education --      Exclude from Growth Chart --     Most recent vital signs: Vitals:   01/22/24 1130 01/22/24 1257  BP: 133/79 (!) 156/92  Pulse: 81 70  Resp: 14 14  Temp:    SpO2: 100% 100%     General: Awake, no distress.  CV:  Good peripheral perfusion.  Resp:  Normal effort.  Abd:  Soft with mild diffuse tenderness.  No distention.  Other:  Dry mucous membranes.   ED Results / Procedures / Treatments   Labs (all labs ordered are listed, but only abnormal results are  displayed) Labs Reviewed  COMPREHENSIVE METABOLIC PANEL WITH GFR - Abnormal; Notable for the following components:      Result Value   Total Protein 8.2 (*)    AST 14 (*)    All other components within normal limits  CBC - Abnormal; Notable for the following components:   RBC 5.17 (*)    MCV 76.6 (*)    MCH 23.6 (*)    RDW 16.3 (*)    All other components within normal limits  URINALYSIS, ROUTINE W REFLEX MICROSCOPIC - Abnormal; Notable for the following components:   Color, Urine YELLOW (*)    APPearance HAZY (*)    Nitrite POSITIVE (*)    Leukocytes,Ua SMALL (*)    Bacteria, UA RARE (*)    All other components within normal limits  LIPASE, BLOOD     EKG  ED ECG REPORT I, Waylon Cassis, the attending physician, personally viewed and interpreted this ECG.  Date: 01/22/2024 EKG Time: 0958 Rate: 85 Rhythm: normal sinus rhythm QRS Axis: normal Intervals: normal ST/T  Wave abnormalities: Nonspecific ST abnormalities Narrative Interpretation: no evidence of acute ischemia; no significant change when compared to EKG of 08/05/2023    RADIOLOGY  CT abdomen/pelvis: I independently viewed and interpreted the images; there were no dilated bowel obstruction free air or free fluid.  IMPRESSION:  1. No acute findings.  2.  Aortic Atherosclerosis (ICD10-I70.0).      PROCEDURES:  Critical Care performed: No  Procedures   MEDICATIONS ORDERED IN ED: Medications  sodium chloride  0.9 % bolus 500 mL (0 mLs Intravenous Stopped 01/22/24 1400)  morphine  (PF) 4 MG/ML injection 4 mg (4 mg Intravenous Given 01/22/24 1112)  ondansetron  (ZOFRAN ) injection 4 mg (4 mg Intravenous Given 01/22/24 1112)  iohexol  (OMNIPAQUE ) 300 MG/ML solution 100 mL (100 mLs Intravenous Contrast Given 01/22/24 1155)  cephALEXin  (KEFLEX ) capsule 500 mg (500 mg Oral Given 01/22/24 1400)     IMPRESSION / MDM / ASSESSMENT AND PLAN / ED COURSE  I reviewed the triage vital signs and the nursing  notes.  53 year old female with PMH as noted above presents with diffuse abdominal pain acute onset this morning after drinking heavily last night.  On exam she has normal vital signs.  There is mild diffuse abdominal tenderness but no peritoneal signs.  Differential diagnosis includes, but is not limited to, colitis, diverticulitis, gastroenteritis, foodborne illness, alcoholic gastritis, PUD, pancreatitis, UTI.  We will obtain lab workup, CT abdomen/pelvis: Give analgesia, fluids, and reassess.  Patient's presentation is most consistent with acute complicated illness / injury requiring diagnostic workup.  The patient is on the cardiac monitor to evaluate for evidence of arrhythmia and/or significant heart rate changes  ----------------------------------------- 2:08 PM on 01/22/2024 -----------------------------------------  Lab workup is reassuring.  CMP and CBC show no acute findings.  Lipase is normal.  Urinalysis does show findings consistent with a UTI which could be playing a role in the patient's pain.  I suspect a component of alcoholic gastritis as well.  CT is negative for acute abnormality.  The patient is tolerating p.o.SABRA  At this time, she is stable for discharge home.  I counseled her on the results of the workup and also contacted family member via phone at her request.  I gave strict return precautions, and she expressed understanding.    FINAL CLINICAL IMPRESSION(S) / ED DIAGNOSES   Final diagnoses:  Generalized abdominal pain  Urinary tract infection without hematuria, site unspecified     Rx / DC Orders   ED Discharge Orders          Ordered    cephALEXin  (KEFLEX ) 500 MG capsule  2 times daily        01/22/24 1339    famotidine  (PEPCID ) 20 MG tablet  2 times daily        01/22/24 1340             Note:  This document was prepared using Dragon voice recognition software and may include unintentional dictation errors.    Jacolyn Pae, MD 01/22/24  1409

## 2024-01-22 NOTE — ED Notes (Signed)
 Patient provided phone as requested.

## 2024-01-25 ENCOUNTER — Ambulatory Visit: Admitting: Physical Therapy

## 2024-02-01 ENCOUNTER — Ambulatory Visit: Admitting: Physical Therapy

## 2024-02-04 ENCOUNTER — Ambulatory Visit: Admitting: Physical Therapy

## 2024-02-08 ENCOUNTER — Ambulatory Visit: Admitting: Physical Therapy

## 2024-02-10 ENCOUNTER — Encounter: Payer: Self-pay | Admitting: Emergency Medicine

## 2024-02-10 ENCOUNTER — Other Ambulatory Visit: Payer: Self-pay

## 2024-02-10 ENCOUNTER — Emergency Department
Admission: EM | Admit: 2024-02-10 | Discharge: 2024-02-10 | Disposition: A | Discharge status: Home / Self Care | Attending: Emergency Medicine | Admitting: Emergency Medicine

## 2024-02-10 DIAGNOSIS — F17200 Nicotine dependence, unspecified, uncomplicated: Secondary | ICD-10-CM | POA: Diagnosis not present

## 2024-02-10 DIAGNOSIS — Z993 Dependence on wheelchair: Secondary | ICD-10-CM | POA: Diagnosis not present

## 2024-02-10 DIAGNOSIS — G40909 Epilepsy, unspecified, not intractable, without status epilepticus: Secondary | ICD-10-CM | POA: Diagnosis not present

## 2024-02-10 DIAGNOSIS — R059 Cough, unspecified: Secondary | ICD-10-CM | POA: Diagnosis not present

## 2024-02-10 DIAGNOSIS — R1084 Generalized abdominal pain: Secondary | ICD-10-CM | POA: Insufficient documentation

## 2024-02-10 DIAGNOSIS — I10 Essential (primary) hypertension: Secondary | ICD-10-CM | POA: Insufficient documentation

## 2024-02-10 DIAGNOSIS — Z87828 Personal history of other (healed) physical injury and trauma: Secondary | ICD-10-CM | POA: Insufficient documentation

## 2024-02-10 DIAGNOSIS — Z602 Problems related to living alone: Secondary | ICD-10-CM | POA: Insufficient documentation

## 2024-02-10 DIAGNOSIS — R531 Weakness: Secondary | ICD-10-CM

## 2024-02-10 LAB — URINALYSIS, ROUTINE W REFLEX MICROSCOPIC
Bilirubin Urine: NEGATIVE
Glucose, UA: NEGATIVE mg/dL
Hgb urine dipstick: NEGATIVE
Ketones, ur: NEGATIVE mg/dL
Leukocytes,Ua: NEGATIVE
Nitrite: NEGATIVE
Protein, ur: NEGATIVE mg/dL
Specific Gravity, Urine: 1.005 (ref 1.005–1.030)
pH: 6 (ref 5.0–8.0)

## 2024-02-10 LAB — COMPREHENSIVE METABOLIC PANEL WITH GFR
ALT: 14 U/L (ref 0–44)
AST: 22 U/L (ref 15–41)
Albumin: 3.9 g/dL (ref 3.5–5.0)
Alkaline Phosphatase: 80 U/L (ref 38–126)
Anion gap: 10 (ref 5–15)
BUN: 14 mg/dL (ref 6–20)
CO2: 24 mmol/L (ref 22–32)
Calcium: 9.3 mg/dL (ref 8.9–10.3)
Chloride: 107 mmol/L (ref 98–111)
Creatinine, Ser: 0.62 mg/dL (ref 0.44–1.00)
GFR, Estimated: >60 mL/min (ref >60)
Glucose, Bld: 77 mg/dL (ref 70–99)
Potassium: 3.5 mmol/L (ref 3.5–5.1)
Sodium: 141 mmol/L (ref 135–145)
Total Bilirubin: 1 mg/dL (ref 0.0–1.2)
Total Protein: 8.3 g/dL — ABNORMAL HIGH (ref 6.5–8.1)

## 2024-02-10 LAB — CBC WITH DIFFERENTIAL/PLATELET
Abs Immature Granulocytes: 0.01 K/uL (ref 0.00–0.07)
Basophils Absolute: 0 K/uL (ref 0.0–0.1)
Basophils Relative: 0 %
Eosinophils Absolute: 0.1 K/uL (ref 0.0–0.5)
Eosinophils Relative: 2 %
HCT: 38.8 % (ref 36.0–46.0)
Hemoglobin: 12.4 g/dL (ref 12.0–15.0)
Immature Granulocytes: 0 %
Lymphocytes Relative: 40 %
Lymphs Abs: 2.6 K/uL (ref 0.7–4.0)
MCH: 24 pg — ABNORMAL LOW (ref 26.0–34.0)
MCHC: 32 g/dL (ref 30.0–36.0)
MCV: 75 fL — ABNORMAL LOW (ref 80.0–100.0)
Monocytes Absolute: 0.5 K/uL (ref 0.1–1.0)
Monocytes Relative: 8 %
Neutro Abs: 3.2 K/uL (ref 1.7–7.7)
Neutrophils Relative %: 50 %
Platelets: 304 K/uL (ref 150–400)
RBC: 5.17 MIL/uL — ABNORMAL HIGH (ref 3.87–5.11)
RDW: 15.9 % — ABNORMAL HIGH (ref 11.5–15.5)
WBC: 6.4 K/uL (ref 4.0–10.5)
nRBC: 0 % (ref 0.0–0.2)

## 2024-02-10 LAB — RESP PANEL BY RT-PCR (RSV, FLU A&B, COVID)  RVPGX2
Influenza A by PCR: NEGATIVE
Influenza B by PCR: NEGATIVE
Resp Syncytial Virus by PCR: NEGATIVE
SARS Coronavirus 2 by RT PCR: NEGATIVE

## 2024-02-10 LAB — CK: Total CK: 304 U/L — ABNORMAL HIGH (ref 38–234)

## 2024-02-10 MED ORDER — TRAMADOL HCL 50 MG PO TABS
50.0000 mg | ORAL_TABLET | Freq: Four times a day (QID) | ORAL | 0 refills | Status: DC | PRN
Start: 2024-02-10 — End: 2024-02-11

## 2024-02-10 MED ORDER — MORPHINE SULFATE (PF) 4 MG/ML IV SOLN: 4.0000 mg | Freq: Once | INTRAVENOUS | Status: DC

## 2024-02-10 MED ORDER — TRAMADOL HCL 50 MG PO TABS
50.0000 mg | ORAL_TABLET | Freq: Once | ORAL | Status: AC
  Administered 2024-02-10: 50 mg via ORAL
  Filled 2024-02-10: qty 1

## 2024-02-10 MED ORDER — MORPHINE SULFATE (PF) 4 MG/ML IV SOLN
4.0000 mg | Freq: Once | INTRAVENOUS | Status: AC
  Administered 2024-02-10: 4 mg via INTRAMUSCULAR
  Filled 2024-02-10: qty 1

## 2024-02-10 MED ORDER — ONDANSETRON HCL 4 MG/2ML IJ SOLN: 4.0000 mg | Freq: Once | INTRAMUSCULAR | Status: DC

## 2024-02-10 MED ORDER — ONDANSETRON 4 MG PO TBDP
4.0000 mg | ORAL_TABLET | Freq: Once | ORAL | Status: AC
  Administered 2024-02-10: 4 mg via ORAL
  Filled 2024-02-10: qty 1

## 2024-02-10 NOTE — ED Notes (Signed)
 Charge RN Powell was called for a bed on Main ED for a boarder.

## 2024-02-10 NOTE — ED Provider Notes (Signed)
 Patient is wheelchair-bound with multiple comorbidities and was referred to the ED by her primary care physician for further evaluation of potential placement in a skilled nursing facility for 30-day rehabilitation.  Upon thorough discussion with the patient and her POA who is also her sister who reinstated that patient does not live alone and stays with her and her boyfriend in the patient's apartment, both parties have expressed a clear preference against facility placement at this time.  They have discussed and mutually agreed upon continuing care at home.  There is no acute medical issue today warranting inpatient admission.  PT evaluated patient at bedside and given contractures of left side, there is limited assessment that could be done at this time.  I do believe the patient will benefit from this in the future and pain management, however this can be coordinated in the outpatient setting, which POA states is in process.  There are no signs of emergent distress and patient is stable for discharge.  We will provide resources for outpatient pain management and advise close follow-up with primary care for further management.  Patient agrees with this care plan.     Margrette, Dilia Alemany A, PA-C 02/10/24 1713    Jacolyn Pae, MD 02/10/24 [phone]

## 2024-02-10 NOTE — ED Notes (Signed)
 Patient given remote per request.

## 2024-02-10 NOTE — ED Triage Notes (Signed)
 Pt to ED via [facility] from home for generalized body pain that started 2-3 days. PT denies any other symptoms at this time.

## 2024-02-10 NOTE — ED Notes (Addendum)
 Patient states she has had generalized body aches for 2-3 days. Patient states she cannot stand and pivot, but can move her legs a bit, and is a total lift to get onto the stretcher. Charge Nurse called for assistance.

## 2024-02-10 NOTE — ED Notes (Signed)
 Patient is drowsy at this time, but easily awakened. Discharge instructions reviewed and left at bedside until transport arrives.

## 2024-02-10 NOTE — ED Notes (Signed)
 Patient is eating dinner at this time. Chicken was cut up by this Clinical research associate and patient requested a diet Ginger ale.

## 2024-02-10 NOTE — ED Notes (Signed)
 Patient's linens were changed. Patient's shorts were wet and removed. New brief was placed after patient was cleaned from In and out cath. Patient was able to roll on side to participate in care.

## 2024-02-10 NOTE — TOC Initial Note (Signed)
 Transition of Care [facility]) - Initial/Assessment Note    Patient Details  Name: Victoria Gross MRN: [id number] Date of Birth: 1971-02-05  [facility] CM/SW Contact:    Edsel DELENA Fischer, LCSW Phone Number: 02/10/2024, 5:58 PM  Clinical Narrative:                  SW met with pt at bedside. Per pt report: Pt stated that she is in ED due to muscle contractions. Pt stated that her sister Antoniette Poteat lives with her along with Antoniette husband and Antoniette is also her POA. PCP: Dr. Ricard and Pharmacy: [facility].  Pt stated she did have HH in the past but unknown of agency name and dates of service.  DME: walker, wheelchair.  Social Support: church.  Pt reports no issues with affording her medications but owes only $20 on rent. No safety/ dv issues concerns.  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 Pt stated that she wears pull ups.  Pt stated that she takes medications as prescribed.  CPAP/Oxygen: no. Dialysis: no   Barriers to Discharge: Continued Medical Work up   Patient Goals and CMS Choice   CMS Medicare.gov Compare Post Acute Care list provided to:: Other (Comment Required) (SW will follow up with Antoniette Poteat with options/ preference) Choice offered to / list presented to : Sibling (SW will follow up with Antoniette Poteat with options/ preference)      Expected Discharge Plan and Services In-house Referral: Clinical Social Work     Living arrangements for the past 2 months: Single Family Home                   DME Agency:  (unknown)                  Prior Living Arrangements/Services Living arrangements for the past 2 months: Single Family Home Lives with:: Siblings, Other (Comment) (lives with sister and sister sisters husband)   Do you feel safe going back to the place where you live?: Yes      Need for Family Participation in Patient Care: Yes (Comment) Care giver support system in place?: Yes (comment) Current home services: DME (walker,  wheelchair) Criminal Activity/Legal Involvement Pertinent to Current Situation/Hospitalization: No - Comment as needed  Activities of Daily Living      Permission Sought/Granted                  Emotional Assessment Appearance:: Appears stated age Attitude/Demeanor/Rapport: Engaged Affect (typically observed): Appropriate Orientation: : Oriented to Self, Oriented to Place, Oriented to  Time, Oriented to Situation   Psych Involvement: No (comment)  Admission diagnosis:  Pain all over EMS Patient Active Problem List   Diagnosis Date Noted   Hypertension 08/05/2023   Shortness of breath 08/05/2023   Adjustment disorder with mixed anxiety and depressed mood 05/31/2022   Folliculitis 01/31/2022   Low back pain 01/31/2022   Constipation 01/27/2022   Migraine headache 01/26/2022   Generalized weakness 01/25/2022   AKI (acute kidney injury) (HCC) 01/23/2022   Hypokalemia 01/23/2022   Neuropathy 01/23/2022   Social problem not due to mental disorder 02/05/2021   Pressure injury of skin 07/22/2020   Cocaine abuse with cocaine-induced mood disorder (HCC) 04/16/2020   Polysubstance abuse (HCC)    Leg edema, left 03/17/2020   Nonspecific abnormal electrocardiogram (ECG) (EKG) 03/17/2020   COVID-19 virus infection 03/16/2020   Iron deficiency anemia 01/23/2020   Iron deficiency anemia due to chronic blood  loss    Anemia due to vitamin B12 deficiency    Acute metabolic encephalopathy    Ovarian mass    Acute lower UTI 01/22/2020   Obesity, Class III, BMI 40-49.9 (morbid obesity) 01/22/2020   UTI (urinary tract infection) 01/22/2020   Lymphedema 11/02/2019   Left-sided chest wall pain 10/10/2019   Cellulitis 04/01/2019   Headache disorder 05/26/2016   Seizure disorder (HCC)    Prosthetic eye globe    Anxiety    Elevated liver enzymes    Hepatitis C 10/18/2013   Weakness of left side of body 07/27/1997   Blindness of left eye 07/27/1997   PCP:  Ricard Tawni KIDD,  MD Pharmacy:   [facility] - [place] - [address] [place] [address] Phone: [phone] Fax: [phone]  [facility] - [place], [place] - [address] [place] [address] Phone: [phone] Fax: [phone]     Social Drivers of Health (SDOH) Social History: SDOH Screenings   Food Insecurity: No Food Insecurity (10/06/2023)  Housing: Unknown (10/06/2023)  Transportation Needs: No Transportation Needs (10/06/2023)  Alcohol Screen: Medium Risk (04/01/2019)  Financial Resource Strain: Low Risk  (01/11/2020)   Received from [facility]  Physical Activity: Inactive (04/01/2019)  Social Connections: Unknown (04/01/2019)  Stress: No Stress Concern Present (04/01/2019)  Tobacco Use: High Risk (02/10/2024)   SDOH Interventions:     Readmission Risk Interventions    01/24/2022    2:06 PM  Readmission Risk Prevention Plan  Transportation Screening Complete  PCP or Specialist Appt within 3-5 Days Complete  HRI or Home Care Consult Complete  Social Work Consult for Recovery Care Planning/Counseling Complete  Palliative Care Screening Not Applicable  Medication Review Oceanographer) Complete
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
## 2024-02-10 NOTE — ED Notes (Addendum)
 Unsuccessful IV attempts. PA-C aware. Patient is dozing on and off.

## 2024-02-10 NOTE — ED Notes (Signed)
 Lab is going to come and draw the rest of the labs. Patient is talking to her sister on the phone.

## 2024-02-10 NOTE — Discharge Instructions (Addendum)
 Please follow-up with your primary care provider.  Call and schedule appointment as soon as possible for further management and discussion of outpatient rehabilitation.  Follow-up with pain management.  You can call and schedule appointment with [facility] health center for pain and rehabilitative medicine for further evaluation.

## 2024-02-10 NOTE — ED Notes (Signed)
 Fall bracelet on, bed alarm on.

## 2024-02-10 NOTE — ED Notes (Addendum)
 [facility] arrived Attempted to call pt's sister with number provided on chart, wrong number.
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
## 2024-02-10 NOTE — ED Notes (Signed)
 Pt's sister called and updated on pt's ETA. Correct number documented in chart. [phone]

## 2024-02-10 NOTE — ED Notes (Signed)
 Lab at bedside

## 2024-02-10 NOTE — ED Provider Notes (Signed)
-----------------------------------------   5:19 PM on 02/10/2024 -----------------------------------------  Patient initially had concern about possibly needing SNF placement, however after further discussion with the POA and the patient, she does not want placement and is safe to go home.   Jacolyn Pae, MD 02/10/24 1719

## 2024-02-10 NOTE — ED Triage Notes (Signed)
 First Nurse Note: Pt to ED via [facility] from home for generalized body pain x 2-3 days. Vital signs stable with EMS. Pt has urine odor per EMS. Pt is wheelchair bound at baseline.
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
## 2024-02-11 ENCOUNTER — Ambulatory Visit: Admitting: Physical Therapy

## 2024-02-11 ENCOUNTER — Telehealth: Payer: Self-pay | Admitting: Emergency Medicine

## 2024-02-11 MED ORDER — TRAMADOL HCL 50 MG PO TABS: 50.0000 mg | ORAL_TABLET | Freq: Four times a day (QID) | ORAL | 0 refills | Status: DC | PRN

## 2024-02-11 NOTE — Telephone Encounter (Signed)
 Patient wanted her prescription changed admitted to medical [employer].  I have switched the prescription.
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
## 2024-02-15 ENCOUNTER — Ambulatory Visit: Admitting: Physical Therapy
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
## 2024-03-02 ENCOUNTER — Emergency Department
Admission: EM | Admit: 2024-03-02 | Discharge: 2024-03-02 | Disposition: A | Discharge status: Home / Self Care | Attending: Emergency Medicine | Admitting: Emergency Medicine

## 2024-03-02 ENCOUNTER — Other Ambulatory Visit: Payer: Self-pay

## 2024-03-02 ENCOUNTER — Emergency Department

## 2024-03-02 DIAGNOSIS — W228XXA Striking against or struck by other objects, initial encounter: Secondary | ICD-10-CM | POA: Insufficient documentation

## 2024-03-02 DIAGNOSIS — I10 Essential (primary) hypertension: Secondary | ICD-10-CM | POA: Insufficient documentation

## 2024-03-02 DIAGNOSIS — Z8616 Personal history of COVID-19: Secondary | ICD-10-CM | POA: Insufficient documentation

## 2024-03-02 DIAGNOSIS — S80912A Unspecified superficial injury of left knee, initial encounter: Secondary | ICD-10-CM | POA: Insufficient documentation

## 2024-03-02 DIAGNOSIS — S8992XA Unspecified injury of left lower leg, initial encounter: Secondary | ICD-10-CM

## 2024-03-02 MED ORDER — CYCLOBENZAPRINE HCL 10 MG PO TABS: 10.0000 mg | ORAL_TABLET | Freq: Three times a day (TID) | ORAL | 0 refills | Status: AC | PRN

## 2024-03-02 MED ORDER — CYCLOBENZAPRINE HCL 10 MG PO TABS
5.0000 mg | ORAL_TABLET | Freq: Once | ORAL | Status: AC
  Administered 2024-03-02: 5 mg via ORAL
  Filled 2024-03-02: qty 1

## 2024-03-02 MED ORDER — DICLOFENAC SODIUM 1 % EX GEL: 4.0000 g | Freq: Four times a day (QID) | CUTANEOUS | 0 refills | Status: AC
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
## 2024-03-02 NOTE — Discharge Instructions (Addendum)
 Have been diagnosed with left knee soft tissue injury.  X-rays today rule out fracture or dislocation on your left knee. You can apply ice pack on your knee.  Please keep your left knee elevated.  Please use the knee brace to decrease pain and swelling.  Take Flexeril  1 tablet every 8 hours.  You can apply Voltaren  gel 3 times per day on your left knee.  Please come back to ED or go to your PCP if you have new symptoms or symptoms worsen

## 2024-03-02 NOTE — ED Notes (Signed)
 [facility]  called for  transport home

## 2024-03-02 NOTE — ED Notes (Signed)
 Provided pt with pillow at this time.
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
## 2024-03-02 NOTE — ED Notes (Signed)
 Ace bandage applied to the pt.
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
## 2024-04-09 ENCOUNTER — Emergency Department

## 2024-04-09 ENCOUNTER — Emergency Department
Admission: EM | Admit: 2024-04-09 | Discharge: 2024-04-09 | Disposition: A | Discharge status: Home / Self Care | Attending: Emergency Medicine | Admitting: Emergency Medicine

## 2024-04-09 ENCOUNTER — Other Ambulatory Visit: Payer: Self-pay

## 2024-04-09 ENCOUNTER — Encounter: Payer: Self-pay | Admitting: Emergency Medicine

## 2024-04-09 DIAGNOSIS — M79605 Pain in left leg: Secondary | ICD-10-CM | POA: Diagnosis present

## 2024-04-09 DIAGNOSIS — I10 Essential (primary) hypertension: Secondary | ICD-10-CM | POA: Insufficient documentation

## 2024-04-09 DIAGNOSIS — L03116 Cellulitis of left lower limb: Secondary | ICD-10-CM | POA: Insufficient documentation

## 2024-04-09 DIAGNOSIS — M79604 Pain in right leg: Secondary | ICD-10-CM | POA: Diagnosis not present

## 2024-04-09 LAB — COMPREHENSIVE METABOLIC PANEL WITH GFR
ALT: 15 U/L (ref 0–44)
AST: 23 U/L (ref 15–41)
Albumin: 4.2 g/dL (ref 3.5–5.0)
Alkaline Phosphatase: 91 U/L (ref 38–126)
Anion gap: 12 (ref 5–15)
BUN: 12 mg/dL (ref 6–20)
CO2: 27 mmol/L (ref 22–32)
Calcium: 9.4 mg/dL (ref 8.9–10.3)
Chloride: 99 mmol/L (ref 98–111)
Creatinine, Ser: 0.62 mg/dL (ref 0.44–1.00)
GFR, Estimated: >60 mL/min (ref >60)
Glucose, Bld: 94 mg/dL (ref 70–99)
Potassium: 3.6 mmol/L (ref 3.5–5.1)
Sodium: 138 mmol/L (ref 135–145)
Total Bilirubin: 0.6 mg/dL (ref 0.0–1.2)
Total Protein: 9.3 g/dL — ABNORMAL HIGH (ref 6.5–8.1)

## 2024-04-09 LAB — URINALYSIS, ROUTINE W REFLEX MICROSCOPIC
Bacteria, UA: NONE SEEN
Bilirubin Urine: NEGATIVE
Glucose, UA: NEGATIVE mg/dL
Hgb urine dipstick: NEGATIVE
Ketones, ur: NEGATIVE mg/dL
Leukocytes,Ua: NEGATIVE
Nitrite: NEGATIVE
Protein, ur: NEGATIVE mg/dL
RBC / HPF: 0 RBC/hpf (ref 0–5)
Specific Gravity, Urine: 1 — ABNORMAL LOW (ref 1.005–1.030)
Squamous Epithelial / HPF: 0 /HPF (ref 0–5)
pH: 7 (ref 5.0–8.0)

## 2024-04-09 LAB — CBC WITH DIFFERENTIAL/PLATELET
Abs Immature Granulocytes: 0.02 K/uL (ref 0.00–0.07)
Basophils Absolute: 0 K/uL (ref 0.0–0.1)
Basophils Relative: 0 %
Eosinophils Absolute: 0.2 K/uL (ref 0.0–0.5)
Eosinophils Relative: 3 %
HCT: 41.9 % (ref 36.0–46.0)
Hemoglobin: 13.3 g/dL (ref 12.0–15.0)
Immature Granulocytes: 0 %
Lymphocytes Relative: 38 %
Lymphs Abs: 2.8 K/uL (ref 0.7–4.0)
MCH: 24.4 pg — ABNORMAL LOW (ref 26.0–34.0)
MCHC: 31.7 g/dL (ref 30.0–36.0)
MCV: 76.7 fL — ABNORMAL LOW (ref 80.0–100.0)
Monocytes Absolute: 0.4 K/uL (ref 0.1–1.0)
Monocytes Relative: 5 %
Neutro Abs: 4 K/uL (ref 1.7–7.7)
Neutrophils Relative %: 54 %
Platelets: 325 K/uL (ref 150–400)
RBC: 5.46 MIL/uL — ABNORMAL HIGH (ref 3.87–5.11)
RDW: 15.8 % — ABNORMAL HIGH (ref 11.5–15.5)
WBC: 7.5 K/uL (ref 4.0–10.5)
nRBC: 0 % (ref 0.0–0.2)

## 2024-04-09 LAB — PROTIME-INR
INR: 1 (ref 0.8–1.2)
Prothrombin Time: 13.5 s (ref 11.4–15.2)

## 2024-04-09 LAB — APTT: aPTT: 27 s (ref 24–36)

## 2024-04-09 MED ORDER — HEPARIN BOLUS VIA INFUSION
5000.0000 [IU] | Freq: Once | INTRAVENOUS | Status: DC
  Filled 2024-04-09: qty 5000

## 2024-04-09 MED ORDER — HYDROCODONE-ACETAMINOPHEN 5-325 MG PO TABS: 1.0000 | ORAL_TABLET | Freq: Four times a day (QID) | ORAL | 0 refills | Status: AC | PRN

## 2024-04-09 MED ORDER — HYDROCODONE-ACETAMINOPHEN 5-325 MG PO TABS: 1.0000 | ORAL_TABLET | Freq: Four times a day (QID) | ORAL | 0 refills | Status: DC | PRN

## 2024-04-09 MED ORDER — HEPARIN BOLUS VIA INFUSION
5000.0000 [IU] | Freq: Once | INTRAVENOUS | Status: AC
  Administered 2024-04-09: 5000 [IU] via INTRAVENOUS
  Filled 2024-04-09: qty 5000

## 2024-04-09 MED ORDER — HYDROMORPHONE HCL 1 MG/ML IJ SOLN
1.0000 mg | Freq: Once | INTRAMUSCULAR | Status: AC
  Administered 2024-04-09: 1 mg via INTRAMUSCULAR
  Filled 2024-04-09: qty 1

## 2024-04-09 MED ORDER — HEPARIN (PORCINE) 25000 UT/250ML-% IV SOLN
1200.0000 [IU]/h | INTRAVENOUS | Status: DC
  Administered 2024-04-09: 1200 [IU]/h via INTRAVENOUS
  Filled 2024-04-09: qty 250

## 2024-04-09 MED ORDER — FENTANYL CITRATE PF 50 MCG/ML IJ SOSY
50.0000 ug | PREFILLED_SYRINGE | Freq: Once | INTRAMUSCULAR | Status: AC
  Administered 2024-04-09: 50 ug via INTRAVENOUS
  Filled 2024-04-09: qty 1

## 2024-04-09 MED ORDER — HEPARIN (PORCINE) 25000 UT/250ML-% IV SOLN: 1200.0000 [IU]/h | INTRAVENOUS | Status: DC

## 2024-04-09 MED ORDER — IOHEXOL 350 MG/ML SOLN
125.0000 mL | Freq: Once | INTRAVENOUS | Status: AC | PRN
  Administered 2024-04-09: 125 mL via INTRAVENOUS

## 2024-04-09 MED ORDER — CEPHALEXIN 500 MG PO CAPS: 500.0000 mg | ORAL_CAPSULE | Freq: Four times a day (QID) | ORAL | 0 refills | Status: AC

## 2024-04-09 NOTE — Discharge Instructions (Addendum)
 You were seen in the ER today for evaluation of your leg pain.  We were initially concerned about a possible arterial or venous problem in your leg, but both your CTA looking at your arteries and ultrasound looking at your veins was reassuring.  It did show some swelling along your ankle that may be related to an early cellulitis that is causing your worsening pain.  I sent a prescription for a short course of antibiotics and pain medication to your pharmacy.  Follow-up with your primary care doctor for further evaluation.  Return to the ER for new or worsening symptoms.  I have sent a prescription for a narcotic pain medicine for severe breathrough pain. Do not drink alcohol, drive or participate in any other potentially dangerous activities while taking this medication as it may make you sleepy. Do not take this medication with any other sedating medications, either prescription or over-the-counter.  This medication is intended for your use only - do not give any to anyone else and keep it in a secure place where nobody else, especially children, have access to it.  It can also cause or worsen constipation, so you may want to consider taking an over-the-counter stool softener while you are taking this medication.
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
## 2024-04-09 NOTE — ED Notes (Signed)
 EDP in room with doppler.

## 2024-04-09 NOTE — ED Notes (Signed)
 Pt states pain started about 2-3 hours PTA   Lower leg old to touch Provider in room

## 2024-04-09 NOTE — ED Provider Notes (Addendum)
 [facility] Provider Note    Event Date/Time   First MD Initiated Contact with Patient 04/09/24 [phone]     (approximate)   History   Dysuria and Leg Pain   HPI  Victoria Gross is a 53 y.o. female history of seizures, hypertension, EtOH abuse, hep C, prediabetes presents emergency department with left leg pain.  Patient states pain started about 3 hours ago.  States her leg feels cold.  Very painful along the thigh.  No known injury.      Physical Exam   Triage Vital Signs: ED Triage Vitals [04/09/24 0727]  Encounter Vitals Group     BP      Girls Systolic BP Percentile      Girls Diastolic BP Percentile      Boys Systolic BP Percentile      Boys Diastolic BP Percentile      Pulse      Resp      Temp      Temp src      SpO2      Weight 219 lb 12.8 oz (99.7 kg)     Height 5' (1.524 m)     Head Circumference      Peak Flow      Pain Score      Pain Loc      Pain Education      Exclude from Growth Chart     Most recent vital signs: Vitals:   04/09/24 0738  BP: (!) 166/122  Pulse: 93  Resp: 16  Temp: 97.8 F (36.6 C)  SpO2: 97%     General: Awake, no distress.   CV:  Good peripheral perfusion.  Resp:  Normal effort. Abd:  No distention.   Other:  Patient has no palpable pulse noted in the left foot, the leg is cold up to about the knee, used Doppler to try and get dorsalis pedis pulse and was unsuccessful.   ED Results / Procedures / Treatments   Labs (all labs ordered are listed, but only abnormal results are displayed) Labs Reviewed  COMPREHENSIVE METABOLIC PANEL WITH GFR  CBC WITH DIFFERENTIAL/PLATELET  URINALYSIS, ROUTINE W REFLEX MICROSCOPIC     EKG     RADIOLOGY     PROCEDURES:   Procedures  Critical Care:   Chief Complaint  Patient presents with   Dysuria   Leg Pain      MEDICATIONS ORDERED IN ED: Medications  HYDROmorphone  (DILAUDID ) injection 1 mg (1 mg Intramuscular Given 04/09/24 0807)      IMPRESSION / MDM / ASSESSMENT AND PLAN / ED COURSE  I reviewed the triage vital signs and the nursing notes.                              Differential diagnosis includes, but is not limited to, arterial occlusion, DVT, cellulitis, compartment syndrome, chronic pain  Patient's presentation is most consistent with acute presentation with potential threat to life or bodily function.   Due to me being unable to find a pulse in patient's foot I did move her to the main side emergency department.  Report was given to Dr. Levander she is aware I was unable to find a pulse with Doppler.  Labs ordered, Dilaudid  1 mg IM given as we were unable to obtain IV access.  Consult to IV team     FINAL CLINICAL IMPRESSION(S) / ED DIAGNOSES   Final diagnoses:  Left leg pain     Rx / DC Orders   ED Discharge Orders     None        Note:  This document was prepared using Dragon voice recognition software and may include unintentional dictation errors.    Gasper Devere ORN, PA-C 04/09/24 0810    Gasper Devere ORN, PA-C 04/09/24 9189    Levander Slate, MD 04/09/24 [phone]

## 2024-04-09 NOTE — ED Notes (Signed)
 Called sister per patients request.

## 2024-04-09 NOTE — ED Notes (Signed)
 Patients brief changed in bed. Warm blankets provided. Patient pulled up in bed to provide comfort. Pillow given.

## 2024-04-09 NOTE — ED Notes (Signed)
 Pillow placed under patients left foot for comfort.

## 2024-04-09 NOTE — ED Provider Notes (Signed)
 [facility] Provider Note    Event Date/Time   First MD Initiated Contact with Patient 04/09/24 [phone]     (approximate)   History   Dysuria and Leg Pain   HPI  Victoria Gross is a  53 year old female with history of hypertension, prior DVT presenting to the emergency department for evaluation of leg pain.  Ports she has had ongoing bilateral lower leg pain, but this morning had onset of worsening pain, more so on her left side.  Enies injury.  Reported strong smell of urine by EMS, patient denies urinary symptoms.    Physical Exam   Triage Vital Signs: ED Triage Vitals  Encounter Vitals Group     BP 04/09/24 0738 (!) 166/122     Girls Systolic BP Percentile --      Girls Diastolic BP Percentile --      Boys Systolic BP Percentile --      Boys Diastolic BP Percentile --      Pulse Rate 04/09/24 0738 93     Resp 04/09/24 0738 16     Temp 04/09/24 0738 97.8 F (36.6 C)     Temp Source 04/09/24 0738 Oral     SpO2 04/09/24 0738 97 %     Weight 04/09/24 0727 219 lb 12.8 oz (99.7 kg)     Height 04/09/24 0727 5' (1.524 m)     Head Circumference --      Peak Flow --      Pain Score 04/09/24 0807 10     Pain Loc --      Pain Education --      Exclude from Growth Chart --     Most recent vital signs: Vitals:   04/09/24 1130 04/09/24 1201  BP: 132/85   Pulse: 93   Resp: 16   Temp:  97.9 F (36.6 C)  SpO2: 100%      General: Awake, interactive  CV:  Regular rate, good peripheral perfusion.  Resp:  Unlabored respirations Abd:  Nondistended.  Neuro:  Fluid speech MSK:  There is swelling of the left lower extremity compared to the contralateral side that the patient reports is new.  I am unable to palpate a DP pulse bilaterally, but I was able to  identify a pulse over the right  DP via Doppler.  I was unable to Doppler a pulse on the left foot.   Both extremities are somewhat cool, but the left leg is colder compared to the contralateral extremity  up to about the knee, warm more proximally.  Palpable and dopplerable left femoral pulse. Patient has limited range of motion of her left leg with an inability to straighten her leg but patient reports this is not new.   ED Results / Procedures / Treatments   Labs (all labs ordered are listed, but only abnormal results are displayed) Labs Reviewed  COMPREHENSIVE METABOLIC PANEL WITH GFR - Abnormal; Notable for the following components:      Result Value   Total Protein 9.3 (*)    All other components within normal limits  CBC WITH DIFFERENTIAL/PLATELET - Abnormal; Notable for the following components:   RBC 5.46 (*)    MCV 76.7 (*)    MCH 24.4 (*)    RDW 15.8 (*)    All other components within normal limits  URINALYSIS, ROUTINE W REFLEX MICROSCOPIC - Abnormal; Notable for the following components:   Color, Urine COLORLESS (*)    APPearance CLEAR (*)  Specific Gravity, Urine 1.000 (*)    All other components within normal limits  PROTIME-INR  APTT     EKG EKG independently reviewed and interpreted by myself demonstrates:    RADIOLOGY Imaging independently reviewed and interpreted by myself demonstrates:  CTA without acute arterial occlusion Lower extremity ultrasound without evidence of DVT   Formal Radiology Read:  US  Venous Img Lower Bilateral Result Date: 04/09/2024 CLINICAL DATA:  Bilateral lower extremity pain. EXAM: BILATERAL LOWER EXTREMITY VENOUS DOPPLER ULTRASOUND TECHNIQUE: Gray-scale sonography with graded compression, as well as color Doppler and duplex ultrasound were performed to evaluate the lower extremity deep venous systems from the level of the common femoral vein and including the common femoral, femoral, profunda femoral, popliteal and calf veins including the posterior tibial, peroneal and gastrocnemius veins when visible. The superficial great saphenous vein was also interrogated. Spectral Doppler was utilized to evaluate flow at rest and with distal  augmentation maneuvers in the common femoral, femoral and popliteal veins. COMPARISON:  None Available. FINDINGS: Evaluation is limited as the patient could not cooperate with exam. RIGHT LOWER EXTREMITY Common Femoral Vein: No evidence of thrombus. Normal compressibility, respiratory phasicity and response to augmentation. Saphenofemoral Junction: No evidence of thrombus. Normal compressibility and flow on color Doppler imaging. Profunda Femoral Vein: No evidence of thrombus. Normal compressibility and flow on color Doppler imaging. Femoral Vein: No evidence of thrombus. Normal compressibility, respiratory phasicity and response to augmentation. Popliteal Vein: No evidence of thrombus. Normal compressibility, respiratory phasicity and response to augmentation. Calf Veins: No evidence of thrombus. Normal compressibility and flow on color Doppler imaging. Superficial Great Saphenous Vein: No evidence of thrombus. Normal compressibility. Venous Reflux:  None. Other Findings:  None. LEFT LOWER EXTREMITY Common Femoral Vein: No evidence of thrombus. Normal compressibility, respiratory phasicity and response to augmentation. Saphenofemoral Junction: No evidence of thrombus. Normal compressibility and flow on color Doppler imaging. Profunda Femoral Vein: No evidence of thrombus. Normal compressibility and flow on color Doppler imaging. Femoral Vein: No evidence of thrombus. Normal compressibility, respiratory phasicity and response to augmentation. Popliteal Vein: The left popliteal vein is not visualized and not well evaluated. Calf Veins: No evidence of thrombus. Normal compressibility and flow on color Doppler imaging. Superficial Great Saphenous Vein: No evidence of thrombus. Normal compressibility. Venous Reflux:  None. Other Findings:  None. IMPRESSION: No evidence of deep venous thrombosis in the visualized bilateral lower extremity veins. Electronically Signed   By: Vanetta Chou M.D.   On: 04/09/2024 12:15    CT Angio Aortobifemoral W and/or Wo Contrast Result Date: 04/09/2024 EXAM: CTA ABDOMEN AND PELVIS WITH AND WITHOUT CONTRAST AND RUNOFF CTA OF THE LOWER EXTREMITIES WITH CONTRAST 04/09/2024 09:59:06 AM TECHNIQUE: CTA images of the abdomen, pelvis and lower extremities with and without intravenous contrast. Three-dimensional MIP/volume rendered formations were performed. Automated exposure control, iterative reconstruction, and/or weight based adjustment of the mA/kV was utilized to reduce the radiation dose to as low as reasonably achievable. COMPARISON: CT abdomen and pelvis from 01/22/2024. CLINICAL HISTORY: Claudication or leg ischemia; suspected left acute limb ischemia. Presents via EMS. States she is having pain to both legs but the worse pain is in left thigh. Also has strong urine smell. Patient's neighbor called EMS because her wheelchair broke in front of her house. Patient contracted and unable to straighten legs causing clipped anatomy and limited ability to position patient. FINDINGS: VASCULATURE: There is aortic atherosclerosis. Streak artifact from left tibial IM nail diminishes arterial detail at the level of the left lower  leg. Within this limitation, there are no signs to indicate clinically significant arterial occlusion to either lower extremity. AORTA: No acute finding. No abdominal aortic aneurysm. No dissection. CELIAC TRUNK: No acute finding. No occlusion or significant stenosis. SUPERIOR MESENTERIC ARTERY: No acute finding. No occlusion or significant stenosis. INFERIOR MESENTERIC ARTERY: No acute finding. No occlusion or significant stenosis. RENAL ARTERIES: No acute finding. No occlusion or significant stenosis. RIGHT ILIAC ARTERIES: No acute finding. No occlusion or significant stenosis. RIGHT FEMORAL ARTERIES: No acute finding. No occlusion or significant stenosis. RIGHT POPLITEAL ARTERY: No acute finding. No occlusion or significant stenosis. RIGHT CALF ARTERIES: No acute finding.  No occlusion or significant stenosis. LEFT ILIAC ARTERIES: No acute finding. No occlusion or significant stenosis. LEFT FEMORAL ARTERIES: No acute finding. No occlusion or significant stenosis. LEFT POPLITEAL ARTERY: No acute finding. No occlusion or significant stenosis. LEFT CALF ARTERIES: Streak artifact from left tibial IM nail diminishes arterial detail at the level of the left lower leg. Within this limitation, there are no signs of vessel occlusion or significant stenosis. ABDOMEN AND PELVIS: LOWER CHEST: Mild dependent changes identified within the posterior lung bases. LIVER: Status post cholecystectomy. GALLBLADDER AND BILE DUCTS: Status post cholecystectomy. SPLEEN: The spleen is unremarkable. PANCREAS: The pancreas is unremarkable. ADRENAL GLANDS: No adrenal mass. KIDNEYS, URETERS AND BLADDER: Unchanged small left kidney cyst measuring 0.9 cm, image 74/6. No follow up imaging recommended. No stones in the kidneys or ureters. No hydronephrosis. No evidence of perinephric or periureteral stranding. Urinary bladder is unremarkable. GI AND BOWEL: Status post appendectomy. Mild stool burden noted within the colon and rectum. Stomach and duodenal sweep demonstrate no acute abnormality. There is no bowel obstruction. No abnormal bowel wall thickening or distension. REPRODUCTIVE: Uterus and adnexal structures are unremarkable. PERITONEUM AND RETROPERITONEUM: No free fluid or fluid collections within the abdomen or pelvis. LYMPH NODES: Similar appearance of borderline enlarged left pelvic sidewall lymph node measuring 1 cm. No abdominal adenopathy. BONES AND SOFT TISSUES: Status post IM nail and screw fixation of the left tibia with resection of the distal fibula. There is skin thickening and subcutaneous edema involving the lower left leg beginning at the knee and extending through the foot. Focal area of increased edema along the medial and lateral aspects of the left ankle identified, image 375/6. Chronic  deformity involving the distal tibia with signs of tibiotalar joint involvement fusion. IMPRESSION: 1. No evidence of acute lower extremity arterial occlusion. Evaluation of the left lower leg is limited by streak artifact from left tibial IM nail. 2. Skin thickening and subcutaneous edema involving the lower left leg, with focal increased edema along the medial and lateral aspects of the left ankle. 3. Aortic atherosclerotic calcification. Electronically signed by: Waddell Calk MD 04/09/2024 10:43 AM EDT RP Workstation: [id number]    PROCEDURES:  Critical Care performed: Yes, see critical care procedure note(s)  CRITICAL CARE Performed by: Nilsa Dade   Total critical care time: 32 minutes  Critical care time was exclusive of separately billable procedures and treating other patients.  Critical care was necessary to treat or prevent imminent or life-threatening deterioration.  Critical care was time spent personally by me on the following activities: development of treatment plan with patient and/or surrogate as well as nursing, discussions with consultants, evaluation of patient's response to treatment, examination of patient, obtaining history from patient or surrogate, ordering and performing treatments and interventions, ordering and review of laboratory studies, ordering and review of radiographic studies, pulse oximetry and re-evaluation of patient's  condition.   Procedures   MEDICATIONS ORDERED IN ED: Medications  HYDROmorphone  (DILAUDID ) injection 1 mg (1 mg Intramuscular Given 04/09/24 0807)  iohexol  (OMNIPAQUE ) 350 MG/ML injection 125 mL (125 mLs Intravenous Contrast Given 04/09/24 0945)  heparin  bolus via infusion 5,000 Units (5,000 Units Intravenous Bolus from Bag 04/09/24 1007)  fentaNYL  (SUBLIMAZE ) injection 50 mcg (50 mcg Intravenous Given 04/09/24 1159)     IMPRESSION / MDM / ASSESSMENT AND PLAN / ED COURSE  I reviewed the triage vital signs and the nursing  notes.  Differential diagnosis includes, but is not limited to, acute arterial occlusion, DVT, traumatic injury, UTI, lower suspicion CVA  Patient's presentation is most consistent with acute presentation with potential threat to life or bodily function.  53 year old female presenting to the emergency department for evaluation of leg pain, acutely worse over her left leg.  Found to have a cold extremity by PA  without palpable pulse by PA, brought to main side for further management.  On my evaluation, both the patient's lower extremities are cool, but is colder and swollen on the left without palpable or dopplerable pulse.  A dopplerable pulse is noted over the right DP but unable to be identified on the right.  I am concerned about an acute arterial occlusion.  Patient does have an intact left femoral pulse.  Heparin  ordered.  Vascular surgery consulted.  Clinical Course as of 04/09/24 1327  Sun Apr 09, 2024  0901 Case discussed with Dr. Laurence.  He request that a CT aorta with runoff be completed for further evaluation and he will follow-up on imaging and provide further recommendations.   [NR]  1045 Received update from Dr. Laurence.  He reviewed the patient's imaging and discussed with radiology.  Does appear to have three-vessel runoff.  Imaging not consistent with arterial occlusion. Will obtain DVT ultrasounds to further evaluate. [NR]  1236 DVT ultrasound without evidence of DVT though limited visualization of some of the patient's veins due to chronic restricted range of motion of her leg.  When the patient was reevaluated, she reports that she does have some chronic swelling in her left lower leg, thought that the swelling may be worse.  Formal radiology read for CTA confirms no arterial occlusion.  Does note subcutaneous edema of the left lower leg with focal edema along the left ankle. [NR]  1323 Urinalysis resulted without evidence of infection.  Patient reassessed and updated on results of  workup.  In light of her reassuring workup here, suspect that a lot of her lower leg pain may be chronic.  She does have focal edema noted along her left ankle and does seem to have some increased tenderness in this area.  Question possible development of cellulitis in this area.  On reassessment, her extremities are now warm to touch.  I still am unable to palpate a DP pulse, but suspect that this is likely related to swelling of the foot.  She has a normal white blood cell count and does not meet SIRS criteria.  Do not feel she needs inpatient admission for this, but do think would be reasonable to discharge on a course of antibiotics.  Patient is comfortable with this plan.  Will also DC with short course of pain medication.  Strict return precautions provided.  Patient discharged in stable condition. [NR]    Clinical Course User Index [NR] Levander Slate, MD     FINAL CLINICAL IMPRESSION(S) / ED DIAGNOSES   Final diagnoses:  Left leg pain  Cellulitis of left ankle     Rx / DC Orders   ED Discharge Orders          Ordered    cephALEXin  (KEFLEX ) 500 MG capsule  4 times daily        04/09/24 1326    HYDROcodone -acetaminophen  (NORCO/VICODIN) 5-325 MG tablet  Every 6 hours PRN,   Status:  Discontinued        04/09/24 1326    HYDROcodone -acetaminophen  (NORCO/VICODIN) 5-325 MG tablet  Every 6 hours PRN        04/09/24 1327             Note:  This document was prepared using Dragon voice recognition software and may include unintentional dictation errors.   Levander Slate, MD 04/09/24 [phone]

## 2024-04-12 ENCOUNTER — Emergency Department: Admission: EM | Admit: 2024-04-12 | Discharge: 2024-04-12 | Disposition: A | Discharge status: Home / Self Care

## 2024-04-12 ENCOUNTER — Other Ambulatory Visit: Payer: Self-pay

## 2024-04-12 DIAGNOSIS — Z59819 Housing instability, housed unspecified: Secondary | ICD-10-CM | POA: Insufficient documentation

## 2024-04-12 DIAGNOSIS — M79652 Pain in left thigh: Secondary | ICD-10-CM | POA: Insufficient documentation

## 2024-04-12 MED ORDER — DIAZEPAM 5 MG PO TABS
5.0000 mg | ORAL_TABLET | Freq: Once | ORAL | Status: AC
  Administered 2024-04-12: 5 mg via ORAL
  Filled 2024-04-12: qty 1

## 2024-04-12 MED ORDER — GABAPENTIN 300 MG PO CAPS
300.0000 mg | ORAL_CAPSULE | Freq: Once | ORAL | Status: AC
  Administered 2024-04-12: 300 mg via ORAL
  Filled 2024-04-12: qty 1

## 2024-04-12 NOTE — ED Notes (Signed)
 Attempted to call [facility] to notify of discharge, phone is now going straight to VM.

## 2024-04-12 NOTE — Discharge Instructions (Addendum)
 You were seen in the emergency department with continued thigh pain.  I reviewed the imaging and vascular surgery recommendations from your last emergency department visit just 3 days ago.  Imaging at that time demonstrated no acute findings and I do not think repeat imaging is necessary.  Your physical exam was reassuring.  Your symptoms may be secondary to neuropathy.  For continued pain control you need to follow-up with your primary care physician.  Please call and make an appointment today.  Return with any acutely worsening symptoms or any other emergency. -- RETURN PRECAUTIONS & AFTERCARE: (ENGLISH) RETURN PRECAUTIONS: Return immediately to the emergency department or see/call your doctor if you feel worse, weak or have changes in speech or vision, are short of breath, have fever, vomiting, pain, bleeding or dark stool, trouble urinating or any new issues. Return here or see/call your doctor if not improving as expected for your suspected condition. FOLLOW-UP CARE: Call your doctor and/or any doctors we referred you to for more advice and to make an appointment. Do this today, tomorrow or after the weekend. Some doctors only take PPO insurance so if you have HMO insurance you may want to contact your HMO or your regular doctor for referral to a specialist within your plan. Either way tell the doctor's office that it was a referral from the emergency department so you get the soonest possible appointment.  YOUR TEST RESULTS: Take result reports of any blood or urine tests, imaging tests and EKG's to your doctor and any referral doctor. Have any abnormal tests repeated. Your doctor or a referral doctor can let you know when this should be done. Also make sure your doctor contacts this hospital to get any test results that are not currently available such as cultures or special tests for infection and final imaging reports, which are often not available at the time you leave the ER but which may list  additional important findings that are not documented on the preliminary report. BLOOD PRESSURE: If your blood pressure was greater than 120/80 have your blood pressure rechecked within 1 to 2 weeks. MEDICATION SIDE EFFECTS: Do not drive, walk, bike, take the bus, etc. if you have received or are being prescribed any sedating medications such as those for pain or anxiety or certain antihistamines like Benadryl . If you have been give one of these here get a taxi home or have a friend drive you home. Ask your pharmacist to counsel you on potential side effects of any new medication

## 2024-04-12 NOTE — ED Notes (Signed)
 [facility] notified of patient arrival at patient request.

## 2024-04-12 NOTE — ED Triage Notes (Signed)
 Patient brought in via [facility] from parking lot. Patient is wheelchair bound at baseline. Complaints of left leg/thigh pain which she was previously seen for 9/14 as well. Patient denies reinjury/trauma.
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
## 2024-04-12 NOTE — ED Notes (Signed)
 Called [facility] to transfer patient back home spoke with Delon / she stated it would be 9:00 am for transport

## 2024-05-02 LAB — GLUCOSE, POCT (MANUAL RESULT ENTRY): POC Glucose: 120 mg/dL — AB (ref 70–99)
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
## 2024-06-08 ENCOUNTER — Ambulatory Visit: Admitting: Cardiology

## 2024-06-18 ENCOUNTER — Emergency Department
Admission: EM | Admit: 2024-06-18 | Discharge: 2024-06-18 | Disposition: A | Discharge status: Home / Self Care | Attending: Emergency Medicine | Admitting: Emergency Medicine

## 2024-06-18 ENCOUNTER — Other Ambulatory Visit: Payer: Self-pay

## 2024-06-18 DIAGNOSIS — M79605 Pain in left leg: Secondary | ICD-10-CM | POA: Diagnosis present

## 2024-06-18 MED ORDER — KETOROLAC TROMETHAMINE 30 MG/ML IJ SOLN
30.0000 mg | Freq: Once | INTRAMUSCULAR | Status: AC
  Administered 2024-06-18: 30 mg via INTRAMUSCULAR
  Filled 2024-06-18: qty 1

## 2024-06-18 MED ORDER — KETOROLAC TROMETHAMINE 10 MG PO TABS: 10.0000 mg | ORAL_TABLET | Freq: Three times a day (TID) | ORAL | 0 refills | Status: AC | PRN

## 2024-06-18 NOTE — ED Notes (Signed)
 Per [facility] patient will be picked up between 9:30 or 9:45

## 2024-06-18 NOTE — Discharge Instructions (Signed)
 Please continue work up with your doctors.

## 2024-06-18 NOTE — ED Provider Notes (Signed)
 [facility] Provider Note    Event Date/Time   First MD Initiated Contact with Patient 06/18/24 [phone]     (approximate)   History   Back Pain   HPI  Victoria Gross is a 53 y.o. female who presents to the emergency department today because concerns for low back and left leg pain.  Patient does have issues with chronic pain primarily in that left leg.  She states that she tried her gabapentin  and baclofen  at home without any significant relief.  The patient denies any recent injuries.  The patient is wheelchair-bound.  She denies any fevers, nausea or vomiting.     Physical Exam   Triage Vital Signs: ED Triage Vitals [06/18/24 0255]  Encounter Vitals Group     BP (!) 151/100     Girls Systolic BP Percentile      Girls Diastolic BP Percentile      Boys Systolic BP Percentile      Boys Diastolic BP Percentile      Pulse Rate 99     Resp 16     Temp      Temp src      SpO2 99 %     Weight      Height      Head Circumference      Peak Flow      Pain Score 10     Pain Loc      Pain Education      Exclude from Growth Chart     Most recent vital signs: Vitals:   06/18/24 0255  BP: (!) 151/100  Pulse: 99  Resp: 16  SpO2: 99%   General: Awake, alert, oriented. CV:  Good peripheral perfusion. Regular rate and rhythm. Resp:  Normal effort. Lungs clear. Abd:  No distention.  Other:  Left upper and lower extremity with weakness and some contractures. Left lower leg without any skin changes.    ED Results / Procedures / Treatments   Labs (all labs ordered are listed, but only abnormal results are displayed) Labs Reviewed - No data to display   EKG  None   RADIOLOGY None   PROCEDURES:  Critical Care performed: No  MEDICATIONS ORDERED IN ED: Medications - No data to display   IMPRESSION / MDM / ASSESSMENT AND PLAN / ED COURSE  I reviewed the triage vital signs and the nursing notes.                              Differential  diagnosis includes, but is not limited to, sciatica, neuropathy, msk pain  Patient's presentation is most consistent with acute presentation with potential threat to life or bodily function.   Patient presented to the emergency department today because concerns for back and left lower leg pain.  Patient states that she has had the pain for quite some time.  Per chart review patient was seen in the emergency department 2 months ago for the left leg pain.  On exam the left leg is without any discoloration.  It is warm.  Mild dorsalis pedis was not able to be palpated.  Per chart review that it has not been able to be palpated in the past.  She did have CT angiogram and a previous ER visit.  Patient was given IV Toradol  and did feel significant improvement.  At this time I think it be reasonable for patient be discharged to continue  workup with outpatient providers but   FINAL CLINICAL IMPRESSION(S) / ED DIAGNOSES   Final diagnoses:  Left leg pain        Rx / DC Orders   ED Discharge Orders     None        Note:  This document was prepared using Dragon voice recognition software and may include unintentional dictation errors.    Floy Roberts, MD 06/18/24 [phone]
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
